# Patient Record
Sex: Female | Born: 1975 | Race: White | Hispanic: No | Marital: Married | Smoking: Never smoker
Health system: Southern US, Community
[De-identification: ages and names within clinical notes are randomized; demographics above are authoritative.]

## PROBLEM LIST (undated history)

## (undated) ENCOUNTER — Inpatient Hospital Stay (HOSPITAL_COMMUNITY): Payer: Self-pay

## (undated) DIAGNOSIS — O09529 Supervision of elderly multigravida, unspecified trimester: Secondary | ICD-10-CM

## (undated) DIAGNOSIS — R87629 Unspecified abnormal cytological findings in specimens from vagina: Secondary | ICD-10-CM

## (undated) DIAGNOSIS — Z9889 Other specified postprocedural states: Secondary | ICD-10-CM

## (undated) DIAGNOSIS — O09299 Supervision of pregnancy with other poor reproductive or obstetric history, unspecified trimester: Secondary | ICD-10-CM

## (undated) DIAGNOSIS — F419 Anxiety disorder, unspecified: Secondary | ICD-10-CM

## (undated) DIAGNOSIS — F32A Depression, unspecified: Secondary | ICD-10-CM

## (undated) DIAGNOSIS — J45909 Unspecified asthma, uncomplicated: Secondary | ICD-10-CM

## (undated) DIAGNOSIS — R112 Nausea with vomiting, unspecified: Secondary | ICD-10-CM

## (undated) DIAGNOSIS — A609 Anogenital herpesviral infection, unspecified: Secondary | ICD-10-CM

## (undated) DIAGNOSIS — F329 Major depressive disorder, single episode, unspecified: Secondary | ICD-10-CM

## (undated) HISTORY — PX: TONSILLECTOMY: SUR1361

## (undated) HISTORY — PX: LASIK: SHX215

## (undated) HISTORY — PX: ADENOIDECTOMY: SUR15

## (undated) HISTORY — PX: OTHER SURGICAL HISTORY: SHX169

## (undated) HISTORY — PX: COLPOSCOPY: SHX161

## (undated) HISTORY — PX: APPENDECTOMY: SHX54

---

## 2016-06-26 ENCOUNTER — Other Ambulatory Visit: Payer: Self-pay | Admitting: Obstetrics and Gynecology

## 2016-06-26 ENCOUNTER — Other Ambulatory Visit (HOSPITAL_COMMUNITY)
Admission: RE | Admit: 2016-06-26 | Discharge: 2016-06-26 | Disposition: A | Discharge status: Home / Self Care | Payer: Commercial Managed Care - PPO | Source: Ambulatory Visit | Attending: Obstetrics and Gynecology | Admitting: Obstetrics and Gynecology

## 2016-06-26 DIAGNOSIS — Z01419 Encounter for gynecological examination (general) (routine) without abnormal findings: Secondary | ICD-10-CM | POA: Diagnosis present

## 2016-06-26 DIAGNOSIS — Z1151 Encounter for screening for human papillomavirus (HPV): Secondary | ICD-10-CM | POA: Insufficient documentation

## 2016-06-27 LAB — CYTOLOGY - PAP

## 2016-11-26 DIAGNOSIS — J0101 Acute recurrent maxillary sinusitis: Secondary | ICD-10-CM | POA: Diagnosis not present

## 2016-12-07 DIAGNOSIS — R05 Cough: Secondary | ICD-10-CM | POA: Diagnosis not present

## 2016-12-11 DIAGNOSIS — R05 Cough: Secondary | ICD-10-CM | POA: Diagnosis not present

## 2016-12-11 DIAGNOSIS — R062 Wheezing: Secondary | ICD-10-CM | POA: Diagnosis not present

## 2016-12-11 DIAGNOSIS — J0100 Acute maxillary sinusitis, unspecified: Secondary | ICD-10-CM | POA: Diagnosis not present

## 2016-12-20 DIAGNOSIS — J343 Hypertrophy of nasal turbinates: Secondary | ICD-10-CM | POA: Diagnosis not present

## 2016-12-20 DIAGNOSIS — J3089 Other allergic rhinitis: Secondary | ICD-10-CM | POA: Diagnosis not present

## 2016-12-20 DIAGNOSIS — J329 Chronic sinusitis, unspecified: Secondary | ICD-10-CM | POA: Diagnosis not present

## 2017-01-25 ENCOUNTER — Encounter: Payer: Self-pay | Admitting: Allergy & Immunology

## 2017-01-25 ENCOUNTER — Encounter (INDEPENDENT_AMBULATORY_CARE_PROVIDER_SITE_OTHER): Payer: Self-pay

## 2017-01-25 ENCOUNTER — Ambulatory Visit (INDEPENDENT_AMBULATORY_CARE_PROVIDER_SITE_OTHER): Payer: Commercial Managed Care - PPO | Admitting: Allergy & Immunology

## 2017-01-25 VITALS — BP 110/80 | HR 89 | Temp 98.5°F | Resp 16 | Ht 68.5 in | Wt 265.6 lb

## 2017-01-25 DIAGNOSIS — J329 Chronic sinusitis, unspecified: Secondary | ICD-10-CM | POA: Diagnosis not present

## 2017-01-25 DIAGNOSIS — T781XXD Other adverse food reactions, not elsewhere classified, subsequent encounter: Secondary | ICD-10-CM

## 2017-01-25 DIAGNOSIS — J45990 Exercise induced bronchospasm: Secondary | ICD-10-CM | POA: Diagnosis not present

## 2017-01-25 DIAGNOSIS — J3089 Other allergic rhinitis: Secondary | ICD-10-CM | POA: Diagnosis not present

## 2017-01-25 NOTE — Progress Notes (Signed)
NEW PATIENT  Date of Service/Encounter:  01/25/17  Referring provider: Dr. Christia Reading   Assessment:   Chronic allergic rhinitis (grass, weeds, trees, molds, dust mite, cat, dog)  Chronic rhinosinusitis  Exercise-induced bronchospasm  Adverse food reaction - with negative skin testing   Plan/Recommendations:   1. Chronic allergic rhinitis - current trying to become pregnant - Testing today showed: positive to one grass, one weed, trees, molds, dust mite, cat, and dog - Avoidance measures provided. - Stop Flonase and change to Dymista two sprays per nostril daily.  - Continue with Allegra 1 tablet daily. - Continue with Pataday eyedrops as needed. - Add Singulair 10 mg daily. - Consider allergen immunotherapy as a means of long-term control. - Allergen immunotherapy can be continued while pregnant, however it cannot be advanced. - Therefore Monica [name] thinks that she is going to become pregnant soon, there is likely no need to start injections at this time. - We can touch base following the birth to see whether she would like to start the injections at that time. - Call your insurance company to check on coverage. - Call us back to let us know if you are interested in pursuing allergen immunotherapy.  2. Exercise-induced bronchospasm - Lung testing looks great today. - Continue with albuterol as needed. - The addition of the Singulair might provide some relief as well. - There is no indication for a controller medication at this time.  3. Adverse food reaction - Testing was negative to onion, strawberry, pineapple, and wheat. - The reactions to the strawberry and pineapple were likely related to oral allergy syndrome. - The onion reaction appears more of an intolerance. - She tolerates wheat without a problem.   4. Return in about 6 months (around 07/28/2017).   Subjective:   Monica [name] is a 41 y.o. female presenting today for evaluation of  Chief Complaint    Patient presents with  . Sinusitis  . Cough    Monica [name] has a history of the following: There are no active problems to display for this patient.   History obtained from: chart review and patient.  Monica [name] was referred by Dr. Christia Reading.     Monica [name] is a 41 y.o. female presenting for an allergy evaluation. She has a recent history of chronic rhinosinusitis. Since October 2017, she has been on 6 rounds of antibiotics as well as steroids and minimal improvement. She describes facial pain, draining eyes, cough, swelling of lymph nodes, and malodorous discolored nasal drainage. These provided minimal if any relief. She was evaluated by Dr. Jenne Pane at the end of January. He felt that her history was concerning for chronic sinusitis in the setting of allergic rhinitis. She had a sinus CT ordered but they are waiting until she becomes ill again before doing this. Currently she is on Flonase two sprays once daily. She is on Allegra once daily. She is on Pataday eye drops and ipratropium as well. She has been on Singulair in the distant past which seemed to help.   Monica [name] has a history of allergic rhinitis. She has gone to ENT and allergist since she was in high school. She was on shots for a period of two years in [place]; testing was performed in her early 49s. She was followed by Dr. Lacretia Leigh at [facility] in [place]. She also was followed by [facility] and she had lingual tissue removed in her early 43s. 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 She [facility] allergy [facility]. She did not feel that they helped.   Monica Mainlandlissa does have exercise induced bronchospasm and does have an albuterol inhaler. She estimates that she was using it daily for two weeks during this past fall and early spring. She was finally changed to a different nose spray and eye drops with an inhaler that seemed to improve her symptoms. This was managed by her  PCP at [facility].   She does have a history of hives intermittently. She has been positive to multiple foods including cantaloupe, Malaw[place]urkey, onion, and others. She does not really eat cantaloupe because it upsets of her stomach. She has stomach pain with the raw onions but she eats cooked onions. She does get mouth itching with pineapple and strawberries. Otherwise, there is no history of other atopic diseases, including asthma, drug allergies, food allergies, or stinging insect allergies. There is no significant infectious history. Vaccinations are up to date.    Past Medical History: There are no active problems to display for this patient.   Medication List:  Allergies as of 01/25/2017   No Known Allergies     Medication List       Accurate as of 01/25/17  3:47 PM. Always use your most recent med list.          cetirizine 10 MG tablet Commonly known as:  ZYRTEC Take by mouth.   fluticasone 50 MCG/ACT nasal spray Commonly known as:  FLONASE Place into the nose.   ipratropium 0.03 % nasal spray Commonly known as:  ATROVENT   levonorgestrel 20 MCG/24HR IUD Commonly known as:  MIRENA by Intrauterine route.   Olopatadine HCl 0.2 % Soln   PROAIR HFA 108 (90 Base) MCG/ACT inhaler Generic drug:  albuterol   valACYclovir 500 MG tablet Commonly known as:  VALTREX Take by mouth.       Birth History: non-contributory.    Developmental History: non-contributory.   Past Surgical History: Past Surgical History:  Procedure Laterality Date  . ADENOIDECTOMY    . CESAREAN SECTION  2013  . TONSILLECTOMY       Family History: Family History  Problem Relation Age of Onset  . Allergic rhinitis Neg Hx   . Angioedema Neg Hx   . Asthma Neg Hx   . Atopy Neg Hx   . Eczema Neg Hx   . Immunodeficiency Neg Hx   . Urticaria Neg Hx      Social History: The patient lives at home with her family. [place] There is no mildew problems and no  roaches. They have hardwood in the main living area is carpeting in the bedrooms. They have gas heating and central cooling. There are 2 dogs inside the home. They do not use dust mite covers on her bedding. There is no tobacco exposure. 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    Review of Systems: a 14-point review of systems is pertinent for what is mentioned in HPI.  Otherwise, all other systems were negative. Constitutional: negative other than that listed in the HPI Eyes: negative other than that listed in the HPI Ears, nose, mouth, throat, and face: negative other than that listed in the HPI Respiratory: negative other than that listed in the HPI Cardiovascular: negative other than that listed in the HPI Gastrointestinal: negative other than that listed in the HPI Genitourinary: negative other than that listed in the HPI Integument:  negative other than that listed in the HPI Hematologic: negative other than that listed in the HPI Musculoskeletal: negative other than that listed in the HPI Neurological: negative other than that listed in the HPI Allergy/Immunologic: negative other than that listed in the HPI    Objective:   Blood pressure 110/80, pulse 89, temperature 98.5 F (36.9 C), temperature source Oral, resp. rate 16, height 5' 8.5" (1.74 m), weight 265 lb 9.6 oz (120.5 kg), SpO2 99 %. Body mass index is 39.79 kg/m.   Physical Exam:  General: Alert, interactive, in no acute distress. Cooperative with the exam. Very pleasant and talkative. Eyes: No conjunctival injection present on the right, No conjunctival injection present on the left, PERRL bilaterally, No discharge on the right, No discharge on the left and No Horner-Trantas dots present Ears: Right TM pearly gray with normal light reflex, Left TM pearly gray with normal light reflex, Right TM intact without perforation and Left TM intact without perforation.  Nose/Throat: External nose within  normal limits and septum midline, turbinates markedly edematous and pale with clear discharge, post-pharynx erythematous with cobblestoning in the posterior oropharynx. Tonsils 2+ without exudates Neck: Supple without thyromegaly.  Adenopathy: no enlarged lymph nodes appreciated in the anterior cervical, occipital, axillary, epitrochlear, inguinal, or popliteal regions Lungs: Clear to auscultation without wheezing, rhonchi or rales. No increased work of breathing. CV: Normal S1/S2, no murmurs. Capillary refill <2 seconds.  Abdomen: Nondistended, nontender. No guarding or rebound tenderness. Bowel sounds present in all fields and hyperactive  Skin: Warm and dry, without lesions or rashes. Extremities:  No clubbing, cyanosis or edema. Neuro:   Grossly intact. No focal deficits appreciated. Responsive to questions.  Diagnostic studies:  Spirometry: results normal (FEV1: 3.63/112%, FVC: 4.50/115%, FEV1/FVC: 80%).    Spirometry consistent with normal pattern.   Allergy Studies:   Indoor/Outdoor Percutaneous Adult Environmental Panel: positive to bahia grass, lamb's quarters, birch, hickory, maple, oak, pecan pollen, Cladosporium, Aspergillus, Bipolaris, Drechslera, Phoma, Candida, Tricophyton, Dp mites, cat and dog. Otherwise negative with adequate controls.  Selected Foods Panel (strawberry, pineapple, wheat, onion): negative with adequate controls    Malachi Bonds, MD [facility]

## 2017-01-25 NOTE — Patient Instructions (Addendum)
1. Chronic allergic rhinitis - Testing today showed: Positive to 1 grass, one weed, trees, molds, dust mite, cat, and dog - Avoidance measures provided. - Stop Flonase and change to Rhinocort two sprays per nostril daily (safe during pregnancy)  - Stop Allegra and change to Zyrtec 10mg  daily.  - Continue with Pataday eyedrops as needed. - Add Singulair 10 mg daily. - Consider allergen immunotherapy as a means of long-term control. - Call your insurance company to check on coverage. - Call us back to let us know if you are interested in pursuing allergen immunotherapy.  2. Exercise-induced bronchospasm - Lung testing looks great today. - Continue with albuterol as needed. - The addition of the Singulair might provide some relief as well. - There is no indication for a controller medication at this time.  3. Return in about 6 months (around 07/28/2017).  Please inform us of any Emergency Department visits, hospitalizations, or changes in symptoms. Call us before going to the ED for breathing or allergy symptoms since we might be able to fit you in for a sick visit. Feel free to contact us anytime with any questions, problems, or concerns.  It was a pleasure to meet you today! Happy spring!   Websites that have reliable patient information: 1. American Academy of Asthma, Allergy, and Immunology: www.aaaai.org 2. Food Allergy Research and Education (FARE): foodallergy.org 3. Mothers of Asthmatics: http://www.asthmacommunitynetwork.org 4. American College of Allergy, Asthma, and Immunology: www.acaai.org  Reducing Pollen Exposure  The American Academy of Allergy, Asthma and Immunology suggests the following steps to reduce your exposure to pollen during allergy seasons.    1. Do not hang sheets or clothing out to dry; pollen may collect on these items. 2. Do not mow lawns or spend time around freshly cut grass; mowing stirs up pollen. 3. Keep windows closed at night.  Keep car windows  closed while driving. 4. Minimize morning activities outdoors, a time when pollen counts are usually at their highest. 5. Stay indoors as much as possible when pollen counts or humidity is high and on windy days when pollen tends to remain in the air longer. 6. Use air conditioning when possible.  Many air conditioners have filters that trap the pollen spores. 7. Use a HEPA room air filter to remove pollen form the indoor air you breathe.  Control of Mold Allergen  Mold and fungi can grow on a variety of surfaces provided certain temperature and moisture conditions exist.  Outdoor molds grow on plants, decaying vegetation and soil.  The major outdoor mold, Alternaria and Cladosporium, are found in very high numbers during hot and dry conditions.  Generally, a late Summer - Fall peak is seen for common outdoor fungal spores.  Rain will temporarily lower outdoor mold spore count, but counts rise rapidly when the rainy period ends.  The most important indoor molds are Aspergillus and Penicillium.  Dark, humid and poorly ventilated basements are ideal sites for mold growth.  The next most common sites of mold growth are the bathroom and the kitchen.  Outdoor MicrosoftMold Control 1. Use air conditioning and keep windows closed 2. Avoid exposure to decaying vegetation. 3. Avoid leaf raking. 4. Avoid grain handling. 5. Consider wearing a face mask if working in moldy areas.  Indoor Mold Control 1. Maintain humidity below 50%. 2. Clean washable surfaces with 5% bleach solution. 3. Remove sources e.g. contaminated carpets.  Control of Dog or Cat Allergen  Avoidance is the best way to manage a dog or  cat allergy. If you have a dog or cat and are allergic to dog or cats, consider removing the dog or cat from the home. If you have a dog or cat but don't want to find it a new home, or if your family wants a pet even though someone in the household is allergic, here are some strategies that may help keep symptoms  at bay:  1. Keep the pet out of your bedroom and restrict it to only a few rooms. Be advised that keeping the dog or cat in only one room will not limit the allergens to that room. 2. Don't pet, hug or kiss the dog or cat; if you do, wash your hands with soap and water. 3. High-efficiency particulate air (HEPA) cleaners run continuously in a bedroom or living room can reduce allergen levels over time. 4. Regular use of a high-efficiency vacuum cleaner or a central vacuum can reduce allergen levels. 5. Giving your dog or cat a bath at least once a week can reduce airborne allergen.  Control of House Dust Mite Allergen    House dust mites play a major role in allergic asthma and rhinitis.  They occur in environments with high humidity wherever human skin, the food for dust mites is found. High levels have been detected in dust obtained from mattresses, pillows, carpets, upholstered furniture, bed covers, clothes and soft toys.  The principal allergen of the house dust mite is found in its feces.  A gram of dust may contain 1,000 mites and 250,000 fecal particles.  Mite antigen is easily measured in the air during house cleaning activities.    1. Encase mattresses, including the box spring, and pillow, in an air tight cover.  Seal the zipper end of the encased mattresses with wide adhesive tape. 2. Wash the bedding in water of 130 degrees Farenheit weekly.  Avoid cotton comforters/quilts and flannel bedding: the most ideal bed covering is the dacron comforter. 3. Remove all upholstered furniture from the bedroom. 4. Remove carpets, carpet padding, rugs, and non-washable window drapes from the bedroom.  Wash drapes weekly or use plastic window coverings. 5. Remove all non-washable stuffed toys from the bedroom.  Wash stuffed toys weekly. 6. Have the room cleaned frequently with a vacuum cleaner and a damp dust-mop.  The patient should not be in a room which is being cleaned and should wait 1 hour  after cleaning before going into the room. 7. Close and seal all heating outlets in the bedroom.  Otherwise, the room will become filled with dust-laden air.  An electric heater can be used to heat the room. 8. Reduce indoor humidity to less than 50%.  Do not use a humidifier.

## 2017-03-20 DIAGNOSIS — N911 Secondary amenorrhea: Secondary | ICD-10-CM | POA: Diagnosis not present

## 2017-03-28 LAB — OB RESULTS CONSOLE GBS: STREP GROUP B AG: POSITIVE

## 2017-03-28 LAB — OB RESULTS CONSOLE HEPATITIS B SURFACE ANTIGEN: Hepatitis B Surface Ag: NEGATIVE

## 2017-03-28 LAB — OB RESULTS CONSOLE GC/CHLAMYDIA
CHLAMYDIA, DNA PROBE: NEGATIVE
GC PROBE AMP, GENITAL: NEGATIVE

## 2017-03-28 LAB — OB RESULTS CONSOLE ANTIBODY SCREEN: ANTIBODY SCREEN: NEGATIVE

## 2017-03-28 LAB — OB RESULTS CONSOLE RUBELLA ANTIBODY, IGM: RUBELLA: IMMUNE

## 2017-03-28 LAB — OB RESULTS CONSOLE ABO/RH: RH Type: POSITIVE

## 2017-03-28 LAB — OB RESULTS CONSOLE RPR: RPR: NONREACTIVE

## 2017-03-28 LAB — OB RESULTS CONSOLE HIV ANTIBODY (ROUTINE TESTING): HIV: NONREACTIVE

## 2017-04-19 DIAGNOSIS — J0100 Acute maxillary sinusitis, unspecified: Secondary | ICD-10-CM | POA: Diagnosis not present

## 2017-04-19 DIAGNOSIS — R0981 Nasal congestion: Secondary | ICD-10-CM | POA: Diagnosis not present

## 2017-06-22 DIAGNOSIS — L299 Pruritus, unspecified: Secondary | ICD-10-CM | POA: Diagnosis not present

## 2017-07-20 DIAGNOSIS — J0100 Acute maxillary sinusitis, unspecified: Secondary | ICD-10-CM | POA: Diagnosis not present

## 2017-07-30 ENCOUNTER — Ambulatory Visit: Payer: Commercial Managed Care - PPO | Admitting: Allergy & Immunology

## 2017-08-09 DIAGNOSIS — Z23 Encounter for immunization: Secondary | ICD-10-CM | POA: Diagnosis not present

## 2017-08-16 ENCOUNTER — Inpatient Hospital Stay (HOSPITAL_COMMUNITY)
Admission: AD | Admit: 2017-08-16 | Discharge: 2017-08-16 | Disposition: A | Discharge status: Home / Self Care | Payer: Commercial Managed Care - PPO | Source: Ambulatory Visit | Attending: Obstetrics & Gynecology | Admitting: Obstetrics & Gynecology

## 2017-08-16 ENCOUNTER — Encounter (HOSPITAL_COMMUNITY): Payer: Self-pay | Admitting: *Deleted

## 2017-08-16 DIAGNOSIS — R51 Headache: Secondary | ICD-10-CM

## 2017-08-16 DIAGNOSIS — O1203 Gestational edema, third trimester: Secondary | ICD-10-CM | POA: Insufficient documentation

## 2017-08-16 DIAGNOSIS — Z3689 Encounter for other specified antenatal screening: Secondary | ICD-10-CM

## 2017-08-16 DIAGNOSIS — Z3A28 28 weeks gestation of pregnancy: Secondary | ICD-10-CM | POA: Diagnosis not present

## 2017-08-16 DIAGNOSIS — O26893 Other specified pregnancy related conditions, third trimester: Secondary | ICD-10-CM

## 2017-08-16 DIAGNOSIS — R609 Edema, unspecified: Secondary | ICD-10-CM

## 2017-08-16 LAB — URINALYSIS, ROUTINE W REFLEX MICROSCOPIC
Bilirubin Urine: NEGATIVE
Glucose, UA: NEGATIVE mg/dL
HGB URINE DIPSTICK: NEGATIVE
Ketones, ur: NEGATIVE mg/dL
Leukocytes, UA: NEGATIVE
Nitrite: NEGATIVE
PH: 5 (ref 5.0–8.0)
Protein, ur: NEGATIVE mg/dL
SPECIFIC GRAVITY, URINE: 1.021 (ref 1.005–1.030)

## 2017-08-16 LAB — COMPREHENSIVE METABOLIC PANEL
ALK PHOS: 86 U/L (ref 38–126)
ALT: 15 U/L (ref 14–54)
ANION GAP: 9 (ref 5–15)
AST: 18 U/L (ref 15–41)
Albumin: 2.9 g/dL — ABNORMAL LOW (ref 3.5–5.0)
BUN: 7 mg/dL (ref 6–20)
CALCIUM: 8.7 mg/dL — AB (ref 8.9–10.3)
CO2: 22 mmol/L (ref 22–32)
Chloride: 104 mmol/L (ref 101–111)
Creatinine, Ser: 0.45 mg/dL (ref 0.44–1.00)
GFR calc Af Amer: >60 mL/min (ref >60)
GFR calc non Af Amer: >60 mL/min (ref >60)
GLUCOSE: 91 mg/dL (ref 65–99)
POTASSIUM: 3.4 mmol/L — AB (ref 3.5–5.1)
SODIUM: 135 mmol/L (ref 135–145)
Total Bilirubin: 0.5 mg/dL (ref 0.3–1.2)
Total Protein: 6.3 g/dL — ABNORMAL LOW (ref 6.5–8.1)

## 2017-08-16 LAB — CBC
HEMATOCRIT: 31.4 % — AB (ref 36.0–46.0)
HEMOGLOBIN: 10.6 g/dL — AB (ref 12.0–15.0)
MCH: 28.3 pg (ref 26.0–34.0)
MCHC: 33.8 g/dL (ref 30.0–36.0)
MCV: 84 fL (ref 78.0–100.0)
Platelets: 287 10*3/uL (ref 150–400)
RBC: 3.74 MIL/uL — ABNORMAL LOW (ref 3.87–5.11)
RDW: 13.5 % (ref 11.5–15.5)
WBC: 11.8 10*3/uL — ABNORMAL HIGH (ref 4.0–10.5)

## 2017-08-16 LAB — PROTEIN / CREATININE RATIO, URINE
CREATININE, URINE: 201 mg/dL
Protein Creatinine Ratio: 0.07 mg/mg{Cre} (ref 0.00–0.15)
Total Protein, Urine: 14 mg/dL

## 2017-08-16 MED ORDER — ACETAMINOPHEN 500 MG PO TABS
1000.0000 mg | ORAL_TABLET | Freq: Four times a day (QID) | ORAL | Status: DC | PRN
Start: 2017-08-16 — End: 2017-08-16
  Administered 2017-08-16: 1000 mg via ORAL
  Filled 2017-08-16: qty 2

## 2017-08-16 NOTE — Discharge Instructions (Signed)
Third Trimester of Pregnancy The third trimester is from week 28 through week 40 (months 7 through 9). The third trimester is a time when the unborn baby (fetus) is growing rapidly. At the end of the ninth month, the fetus is about 20 inches in length and weighs 6-10 pounds. Body changes during your third trimester Your body will continue to go through many changes during pregnancy. The changes vary from woman to woman. During the third trimester:  Your weight will continue to increase. You can expect to gain 25-35 pounds (11-16 kg) by the end of the pregnancy.  You may begin to get stretch marks on your hips, abdomen, and breasts.  You may urinate more often because the fetus is moving lower into your pelvis and pressing on your bladder.  You may develop or continue to have heartburn. This is caused by increased hormones that slow down muscles in the digestive tract.  You may develop or continue to have constipation because increased hormones slow digestion and cause the muscles that push waste through your intestines to relax.  You may develop hemorrhoids. These are swollen veins (varicose veins) in the rectum that can itch or be painful.  You may develop swollen, bulging veins (varicose veins) in your legs.  You may have increased body aches in the pelvis, back, or thighs. This is due to weight gain and increased hormones that are relaxing your joints.  You may have changes in your hair. These can include thickening of your hair, rapid growth, and changes in texture. Some women also have hair loss during or after pregnancy, or hair that feels dry or thin. Your hair will most likely return to normal after your baby is born.  Your breasts will continue to grow and they will continue to become tender. A yellow fluid (colostrum) may leak from your breasts. This is the first milk you are producing for your baby.  Your belly button may stick out.  You may notice more swelling in your hands,  face, or ankles.  You may have increased tingling or numbness in your hands, arms, and legs. The skin on your belly may also feel numb.  You may feel short of breath because of your expanding uterus.  You may have more problems sleeping. This can be caused by the size of your belly, increased need to urinate, and an increase in your body's metabolism.  You may notice the fetus "dropping," or moving lower in your abdomen (lightening).  You may have increased vaginal discharge.  You may notice your joints feel loose and you may have pain around your pelvic bone.  What to expect at prenatal visits You will have prenatal exams every 2 weeks until week 36. Then you will have weekly prenatal exams. During a routine prenatal visit:  You will be weighed to make sure you and the baby are growing normally.  Your blood pressure will be taken.  Your abdomen will be measured to track your baby's growth.  The fetal heartbeat will be listened to.  Any test results from the previous visit will be discussed.  You may have a cervical check near your due date to see if your cervix has softened or thinned (effaced).  You will be tested for Group B streptococcus. This happens between 35 and 37 weeks.  Your health care provider may ask you:  What your birth plan is.  How you are feeling.  If you are feeling the baby move.  If you have had   any abnormal symptoms, such as leaking fluid, bleeding, severe headaches, or abdominal cramping.  If you are using any tobacco products, including cigarettes, chewing tobacco, and electronic cigarettes.  If you have any questions.  Other tests or screenings that may be performed during your third trimester include:  Blood tests that check for low iron levels (anemia).  Fetal testing to check the health, activity level, and growth of the fetus. Testing is done if you have certain medical conditions or if there are problems during the  pregnancy.  Nonstress test (NST). This test checks the health of your baby to make sure there are no signs of problems, such as the baby not getting enough oxygen. During this test, a belt is placed around your belly. The baby is made to move, and its heart rate is monitored during movement.  What is false labor? False labor is a condition in which you feel small, irregular tightenings of the muscles in the womb (contractions) that usually go away with rest, changing position, or drinking water. These are called Braxton Hicks contractions. Contractions may last for hours, days, or even weeks before true labor sets in. If contractions come at regular intervals, become more frequent, increase in intensity, or become painful, you should see your health care provider. What are the signs of labor?  Abdominal cramps.  Regular contractions that start at 10 minutes apart and become stronger and more frequent with time.  Contractions that start on the top of the uterus and spread down to the lower abdomen and back.  Increased pelvic pressure and dull back pain.  A watery or bloody mucus discharge that comes from the vagina.  Leaking of amniotic fluid. This is also known as your "water breaking." It could be a slow trickle or a gush. Let your health care provider know if it has a color or strange odor. If you have any of these signs, call your health care provider right away, even if it is before your due date. Follow these instructions at home: Medicines  Follow your health care provider's instructions regarding medicine use. Specific medicines may be either safe or unsafe to take during pregnancy.  Take a prenatal vitamin that contains at least 600 micrograms (mcg) of folic acid.  If you develop constipation, try taking a stool softener if your health care provider approves. Eating and drinking  Eat a balanced diet that includes fresh fruits and vegetables, whole grains, good sources of protein  such as meat, eggs, or tofu, and low-fat dairy. Your health care provider will help you determine the amount of weight gain that is right for you.  Avoid raw meat and uncooked cheese. These carry germs that can cause birth defects in the baby.  If you have low calcium intake from food, talk to your health care provider about whether you should take a daily calcium supplement.  Eat four or five small meals rather than three large meals a day.  Limit foods that are high in fat and processed sugars, such as fried and sweet foods.  To prevent constipation: ? Drink enough fluid to keep your urine clear or pale yellow. ? Eat foods that are high in fiber, such as fresh fruits and vegetables, whole grains, and beans. Activity  Exercise only as directed by your health care provider. Most women can continue their usual exercise routine during pregnancy. Try to exercise for 30 minutes at least 5 days a week. Stop exercising if you experience uterine contractions.  Avoid heavy   lifting.  Do not exercise in extreme heat or humidity, or at high altitudes.  Wear low-heel, comfortable shoes.  Practice good posture.  You may continue to have sex unless your health care provider tells you otherwise. Relieving pain and discomfort  Take frequent breaks and rest with your legs elevated if you have leg cramps or low back pain.  Take warm sitz baths to soothe any pain or discomfort caused by hemorrhoids. Use hemorrhoid cream if your health care provider approves.  Wear a good support bra to prevent discomfort from breast tenderness.  If you develop varicose veins: ? Wear support pantyhose or compression stockings as told by your healthcare provider. ? Elevate your feet for 15 minutes, 3-4 times a day. Prenatal care  Write down your questions. Take them to your prenatal visits.  Keep all your prenatal visits as told by your health care provider. This is important. Safety  Wear your seat belt at  all times when driving.  Make a list of emergency phone numbers, including numbers for family, friends, the hospital, and police and fire departments. General instructions  Avoid cat litter boxes and soil used by cats. These carry germs that can cause birth defects in the baby. If you have a cat, ask someone to clean the litter box for you.  Do not travel far distances unless it is absolutely necessary and only with the approval of your health care provider.  Do not use hot tubs, steam rooms, or saunas.  Do not drink alcohol.  Do not use any products that contain nicotine or tobacco, such as cigarettes and e-cigarettes. If you need help quitting, ask your health care provider.  Do not use any medicinal herbs or unprescribed drugs. These chemicals affect the formation and growth of the baby.  Do not douche or use tampons or scented sanitary pads.  Do not cross your legs for long periods of time.  To prepare for the arrival of your baby: ? Take prenatal classes to understand, practice, and ask questions about labor and delivery. ? Make a trial run to the hospital. ? Visit the hospital and tour the maternity area. ? Arrange for maternity or paternity leave through employers. ? Arrange for family and friends to take care of pets while you are in the hospital. ? Purchase a rear-facing car seat and make sure you know how to install it in your car. ? Pack your hospital bag. ? Prepare the baby's nursery. Make sure to remove all pillows and stuffed animals from the baby's crib to prevent suffocation.  Visit your dentist if you have not gone during your pregnancy. Use a soft toothbrush to brush your teeth and be gentle when you floss. Contact a health care provider if:  You are unsure if you are in labor or if your water has broken.  You become dizzy.  You have mild pelvic cramps, pelvic pressure, or nagging pain in your abdominal area.  You have lower back pain.  You have persistent  nausea, vomiting, or diarrhea.  You have an unusual or bad smelling vaginal discharge.  You have pain when you urinate. Get help right away if:  Your water breaks before 37 weeks.  You have regular contractions less than 5 minutes apart before 37 weeks.  You have a fever.  You are leaking fluid from your vagina.  You have spotting or bleeding from your vagina.  You have severe abdominal pain or cramping.  You have rapid weight loss or weight gain.    You have shortness of breath with chest pain.  You notice sudden or extreme swelling of your face, hands, ankles, feet, or legs.  Your baby makes fewer than 10 movements in 2 hours.  You have severe headaches that do not go away when you take medicine.  You have vision changes. Summary  The third trimester is from week 28 through week 40, months 7 through 9. The third trimester is a time when the unborn baby (fetus) is growing rapidly.  During the third trimester, your discomfort may increase as you and your baby continue to gain weight. You may have abdominal, leg, and back pain, sleeping problems, and an increased need to urinate.  During the third trimester your breasts will keep growing and they will continue to become tender. A yellow fluid (colostrum) may leak from your breasts. This is the first milk you are producing for your baby.  False labor is a condition in which you feel small, irregular tightenings of the muscles in the womb (contractions) that eventually go away. These are called Braxton Hicks contractions. Contractions may last for hours, days, or even weeks before true labor sets in.  Signs of labor can include: abdominal cramps; regular contractions that start at 10 minutes apart and become stronger and more frequent with time; watery or bloody mucus discharge that comes from the vagina; increased pelvic pressure and dull back pain; and leaking of amniotic fluid. This information is not intended to replace advice  given to you by your health care provider. Make sure you discuss any questions you have with your health care provider. Document Released: 10/31/2001 Document Revised: 04/13/2016 Document Reviewed: 01/07/2013 Elsevier Interactive Patient Education  2017 [employer]  

## 2017-08-16 NOTE — MAU Provider Note (Signed)
History     CSN: [id number]  Arrival date and time: 08/16/17 4098   First Provider Initiated Contact with Patient 08/16/17 1858      Chief Complaint  Patient presents with  . Headache   G2P1001 .3 wks here with HA and swelling. Sx started about 2 weeks ago and have been intermittent. HA is frontal and she has not taken anything for it. Denies RUQ/epigastric pain. Reports good FM. H/o HELLP  wks and CS with G1.   OB History    Gravida Para Term Preterm AB Living   SAB TAB Ectopic Multiple Live Births                  Past Medical History:  Diagnosis Date  . Medical history non-contributory     Past Surgical History:  Procedure Laterality Date  . ADENOIDECTOMY    . CESAREAN SECTION  2013  . TONSILLECTOMY      Family History  Problem Relation Age of Onset  . Allergic rhinitis Neg Hx   . Angioedema Neg Hx   . Asthma Neg Hx   . Atopy Neg Hx   . Eczema Neg Hx   . Immunodeficiency Neg Hx   . Urticaria Neg Hx     Social History  Substance Use Topics  . Smoking status: Never Smoker  . Smokeless tobacco: Never Used  . Alcohol use No    Allergies: No Known Allergies  Prescriptions Prior to Admission  Medication Sig Dispense Refill Last Dose  . albuterol (PROAIR HFA) 108 (90 Base) MCG/ACT inhaler    Taking  . cetirizine (ZYRTEC) 10 MG tablet Take by mouth.   Taking  . fluticasone (FLONASE) 50 MCG/ACT nasal spray Place into the nose.   Taking  . ipratropium (ATROVENT) 0.03 % nasal spray    Taking  . levonorgestrel (MIRENA) 20 MCG/24HR IUD by Intrauterine route.   Taking  . Olopatadine HCl 0.2 % SOLN    Taking  . valACYclovir (VALTREX) 500 MG tablet Take by mouth.   Taking    Review of Systems  Respiratory: Positive for shortness of breath (intermittent).   Cardiovascular: Positive for leg swelling.  Gastrointestinal: Negative for abdominal pain.  Genitourinary: Negative for vaginal bleeding.  Musculoskeletal: Positive for back pain.   Neurological: Positive for light-headedness and headaches.   Physical Exam   Blood pressure 119/63, pulse 88, temperature 98.1 F (36.7 C), temperature source Oral, resp. rate 19, height 5' 8.5" (1.74 m), weight 291 lb (132 kg), SpO2 100 %.  Patient Vitals for the past 24 hrs:  BP Temp Temp src Pulse Resp SpO2 Height Weight  08/16/17 1931 119/63 - - 88 - - - -  08/16/17 1915 127/70 - - 96 - - - -  08/16/17 1850 115/72 - - 96 - - - -  08/16/17 1834 (!) 173/72 98.1 F (36.7 C) Oral 98 19 100 % 5' 8.5" (1.74 m) 291 lb (132 kg)    Physical Exam  Nursing note and vitals reviewed. Constitutional: She is oriented to person, place, and time. She appears well-developed and well-nourished.  HENT:  Head: Normocephalic and atraumatic.  Neck: Normal range of motion.  Cardiovascular: Normal rate.   Respiratory: Effort normal.  GI: Soft. She exhibits no distension. There is no tenderness.  gravid  Musculoskeletal: Normal range of motion. She exhibits edema (2+ BLE).  Neurological: She is alert and oriented to person, place, and time.  Skin:  Skin is warm and dry.  Psychiatric: She has a normal mood and affect.  EFM: 130 bpm, mod variability, + accels, no decels Toco: none  Results for orders placed or performed during the hospital encounter of 08/16/17 (from the past 24 hour(s))  Urinalysis, Routine w reflex microscopic     Status: Abnormal   Collection Time: 08/16/17  6:34 PM  Result Value Ref Range   Color, Urine YELLOW YELLOW   APPearance CLOUDY (A) CLEAR   Specific Gravity, Urine 1.021 1.005 - 1.030   pH 5.0 5.0 - 8.0   Glucose, UA NEGATIVE NEGATIVE mg/dL   Hgb urine dipstick NEGATIVE NEGATIVE   Bilirubin Urine NEGATIVE NEGATIVE   Ketones, ur NEGATIVE NEGATIVE mg/dL   Protein, ur NEGATIVE NEGATIVE mg/dL   Nitrite NEGATIVE NEGATIVE   Leukocytes, UA NEGATIVE NEGATIVE  Protein / creatinine ratio, urine     Status: None   Collection Time: 08/16/17  6:34 PM  Result Value Ref Range    Creatinine, Urine 201.00 mg/dL   Total Protein, Urine 14 mg/dL   Protein Creatinine Ratio 0.07 0.00 - 0.15 mg/mg[Cre]  Comprehensive metabolic panel     Status: Abnormal   Collection Time: 08/16/17  7:08 PM  Result Value Ref Range   Sodium 135 135 - 145 mmol/L   Potassium 3.4 (L) 3.5 - 5.1 mmol/L   Chloride 104 101 - 111 mmol/L   CO2 22 22 - 32 mmol/L   Glucose, Bld 91 65 - 99 mg/dL   BUN 7 6 - 20 mg/dL   Creatinine, Ser 1.61 0.44 - 1.00 mg/dL   Calcium 8.7 (L) 8.9 - 10.3 mg/dL   Total Protein 6.3 (L) 6.5 - 8.1 g/dL   Albumin 2.9 (L) 3.5 - 5.0 g/dL   AST 18 15 - 41 U/L   ALT 15 14 - 54 U/L   Alkaline Phosphatase 86 38 - 126 U/L   Total Bilirubin 0.5 0.3 - 1.2 mg/dL   GFR calc non Af Amer >60 >60 mL/min   GFR calc Af Amer >60 >60 mL/min   Anion gap 9 5 - 15  CBC     Status: Abnormal   Collection Time: 08/16/17  7:08 PM  Result Value Ref Range   WBC 11.8 (H) 4.0 - 10.5 K/uL   RBC 3.74 (L) 3.87 - 5.11 MIL/uL   Hemoglobin 10.6 (L) 12.0 - 15.0 g/dL   HCT 09.6 (L) 04.5 - 40.9 %   MCV 84.0 78.0 - 100.0 fL   MCH 28.3 26.0 - 34.0 pg   MCHC 33.8 30.0 - 36.0 g/dL   RDW 81.1 91.4 - 78.2 %   Platelets 287 150 - 400 K/uL   MAU Course  Procedures  MDM Labs ordered and reviewed. No evidence of pre-e. HA improved. Presentation, clinical findings, and plan discussed with Dr. Langston Masker. Stable for discharge home.  Assessment and Plan   1. [redacted] weeks gestation of pregnancy   2. NST (non-stress test) reactive   3. Dependent edema   4. Pregnancy headache in third trimester    Discharge home Follow up in OB office next week as scheduled Strict pre-e precautions Maternity belt Compression hose Elevate feet/legs Increase H2O  Allergies as of 08/16/2017   No Known Allergies     Medication List    STOP taking these medications   ipratropium 0.03 % nasal spray Commonly known as:  ATROVENT   levonorgestrel 20 MCG/24HR IUD Commonly known as:  MIRENA   Olopatadine HCl 0.2 % Soln  valACYclovir 500 MG tablet Commonly known as:  VALTREX     TAKE these medications   cetirizine 10 MG tablet Commonly known as:  ZYRTEC Take by mouth.   fluticasone 50 MCG/ACT nasal spray Commonly known as:  FLONASE Place into the nose.   PROAIR HFA 108 (90 Base) MCG/ACT inhaler Generic drug:  albuterol            Discharge Care Instructions        Start     Ordered   08/16/17 0000  Discharge patient    Question Answer Comment  Discharge disposition 01-Home or Self Care   Discharge patient date 08/16/2017      08/16/17 1955     Donette Larry, Ina Homes 08/16/2017, 7:59 PM

## 2017-08-16 NOTE — MAU Note (Signed)
Pt states she had really bad swelling in her feet and ankles today, has had headaches for about 2 weeks and dizziness off/on.

## 2017-09-06 DIAGNOSIS — Z23 Encounter for immunization: Secondary | ICD-10-CM | POA: Diagnosis not present

## 2017-10-08 NOTE — H&P (Signed)
Monica [name] is a 41 y.o. female presenting for repeat c-section.  OB History    Gravida Para Term Preterm AB Living   2 1 1          SAB TAB Ectopic Multiple Live Births                 Past Medical History:  Diagnosis Date  . Medical history non-contributory    Past Surgical History:  Procedure Laterality Date  . ADENOIDECTOMY    . CESAREAN SECTION  2013  . TONSILLECTOMY     Family History: family history is not on file. Social History:  reports that  has never smoked. she has never used smokeless tobacco. She reports that she does not drink alcohol or use drugs.     Maternal Diabetes: No Genetic Screening: Normal Maternal Ultrasounds/Referrals: Normal Fetal Ultrasounds or other Referrals:  None Maternal Substance Abuse:  No Significant Maternal Medications:  None Significant Maternal Lab Results:  None Other Comments:  None  ROS History   Exam Physical Exam  Gen - NAD ABd - gravid, NT Ext - NT CV - RRR Lungs - clear Prenatal labs: ABO, Rh:   Antibody:   Rubella:   RPR:    HBsAg:    HIV:    GBS:   negative  Assessment/Plan: Previous c-section.  Desires repeat   Monica [name] 10/08/2017, 4:54 PM

## 2017-10-16 ENCOUNTER — Encounter (HOSPITAL_COMMUNITY): Payer: Self-pay

## 2017-10-16 ENCOUNTER — Other Ambulatory Visit: Payer: Self-pay

## 2017-10-16 ENCOUNTER — Inpatient Hospital Stay (HOSPITAL_COMMUNITY)
Admission: AD | Admit: 2017-10-16 | Discharge: 2017-10-16 | Disposition: A | Discharge status: Home / Self Care | Payer: Commercial Managed Care - PPO | Source: Ambulatory Visit | Attending: Obstetrics and Gynecology | Admitting: Obstetrics and Gynecology

## 2017-10-16 DIAGNOSIS — Z3A37 37 weeks gestation of pregnancy: Secondary | ICD-10-CM

## 2017-10-16 DIAGNOSIS — O26893 Other specified pregnancy related conditions, third trimester: Secondary | ICD-10-CM | POA: Insufficient documentation

## 2017-10-16 DIAGNOSIS — O4703 False labor before 37 completed weeks of gestation, third trimester: Secondary | ICD-10-CM

## 2017-10-16 DIAGNOSIS — O9989 Other specified diseases and conditions complicating pregnancy, childbirth and the puerperium: Secondary | ICD-10-CM

## 2017-10-16 DIAGNOSIS — M79604 Pain in right leg: Secondary | ICD-10-CM | POA: Diagnosis not present

## 2017-10-16 DIAGNOSIS — Z79899 Other long term (current) drug therapy: Secondary | ICD-10-CM | POA: Insufficient documentation

## 2017-10-16 NOTE — Discharge Instructions (Signed)
[facility] to [facility] at 8 am!!!  Bring paperwork from tonight's visit!      Deep Vein Thrombosis A deep vein thrombosis (DVT) is a blood clot (thrombus) that usually occurs in a deep, larger vein of the lower leg or the pelvis, or in an upper extremity such as the arm. These are dangerous and can lead to serious and even life-threatening complications if the clot travels to the lungs. A DVT can damage the valves in your leg veins so that instead of flowing upward, the blood pools in the lower leg. This is called post-thrombotic syndrome, and it can result in pain, swelling, discoloration, and sores on the leg. What are the causes? A DVT is caused by the formation of a blood clot in your leg, pelvis, or arm. Usually, several things contribute to the formation of blood clots. A clot may develop when:  Your blood flow slows down.  Your vein becomes damaged in some way.  You have a condition that makes your blood clot more easily.  What increases the risk? A DVT is more likely to develop in:  People who are older, especially over 39 years of age.  People who are overweight (obese).  People who sit or lie still for a long time, such as during long-distance travel (over 4 hours), bed rest, hospitalization, or during recovery from certain medical conditions like a stroke.  People who do not engage in much physical activity (sedentary lifestyle).  People who have chronic breathing disorders.  People who have a personal or family history of blood clots or blood clotting disease.  People who have peripheral vascular disease (PVD), diabetes, or some types of cancer.  People who have heart disease, especially if the person had a recent heart attack or has congestive heart failure.  People who have neurological diseases that affect the legs (leg paresis).  People who have had a traumatic injury, such as breaking a hip or leg.  People who have recently had major or lengthy surgery,  especially on the hip, knee, or abdomen.  People who have had a central line placed inside a large vein.  People who take medicines that contain the hormone estrogen. These include birth control pills and hormone replacement therapy.  Pregnancy or during childbirth or the postpartum period.  Long plane flights (over 8 hours).  What are the signs or symptoms?  Symptoms of a DVT can include:  Swelling of your leg or arm, especially if one side is much worse.  Warmth and redness of your leg or arm, especially if one side is much worse.  Pain in your arm or leg. If the clot is in your leg, symptoms may be more noticeable or worse when you stand or walk.  A feeling of pins and needles, if the clot is in the arm.  The symptoms of a DVT that has traveled to the lungs (pulmonary embolism, PE) usually start suddenly and include:  Shortness of breath while active or at rest.  Coughing or coughing up blood or blood-tinged mucus.  Chest pain that is often worse with deep breaths.  Rapid or irregular heartbeat.  Feeling light-headed or dizzy.  Fainting.  Feeling anxious.  Sweating.  There may also be pain and swelling in a leg if that is where the blood clot started. These symptoms may represent a serious problem that is an emergency. Do not wait to see if the symptoms will go away. Get medical help right away. Call your local  emergency services (911 in the [place] Do not drive yourself to the hospital. How is this diagnosed? Your health care provider will take a medical history and perform a physical exam. You may also have other tests, including:  Blood tests to assess the clotting properties of your blood.  Imaging tests, such as CT, ultrasound, MRI, X-ray, and other tests to see if you have clots anywhere in your body.  How is this treated? After a DVT is identified, it can be treated. The type of treatment that you receive depends on many factors, such as the cause of your  DVT, your risk for bleeding or developing more clots, and other medical conditions that you have. Sometimes, a combination of treatments is necessary. Treatment options may be combined and include:  Monitoring the blood clot with ultrasound.  Taking medicines by mouth, such as newer blood thinners (anticoagulants), thrombolytics, or warfarin.  Taking anticoagulant medicine by injection or through an IV tube.  Wearing compression stockings or using different types ofdevices.  Surgery (rare) to remove the blood clot or to place a filter in your abdomen to stop the blood clot from traveling to your lungs.  Treatments for a DVT are often divided into immediate treatment and long-term treatment (up to 3 months after DVT). You can work with your health care provider to choose the treatment program that is best for you. Follow these instructions at home: If you are taking a newer oral anticoagulant:  Take the medicine every single day at the same time each day.  Understand what foods and drugs interact with this medicine.  Understand that there are no regular blood tests required when using this medicine.  Understand the side effects of this medicine, including excessive bruising or bleeding. Ask your health care provider or pharmacist about other possible side effects. If you are taking warfarin:  Understand how to take warfarin and know which foods can affect how warfarin works in Public relations account executiveyour body.  Understand that it is dangerous to take too much or too little warfarin. Too much warfarin increases the risk of bleeding. Too little warfarin continues to allow the risk for blood clots.  Follow your PT and INR blood testing schedule. The PT and INR results allow your health care provider to adjust your dose of warfarin. It is very important that you have your PT and INR tested as often as told by your health care provider.  Avoid major changes in your diet, or tell your health care provider before  you change your diet. Arrange a visit with a registered dietitian to answer your questions. Many foods, especially foods that are high in vitamin K, can interfere with warfarin and affect the PT and INR results. Eat a consistent amount of foods that are high in vitamin K, such as: ? Spinach, kale, broccoli, cabbage, collard greens, turnip greens, Brussels sprouts, peas, cauliflower, seaweed, and parsley. ? Beef liver and pork liver. ? Green tea. ? Soybean oil.  Tell your health care provider about any and all medicines, vitamins, and supplements that you take, including aspirin and other over-the-counter anti-inflammatory medicines. Be especially cautious with aspirin and anti-inflammatory medicines. Do not take those before you ask your health care provider if it is safe to do so. This is important because many medicines can interfere with warfarin and affect the PT and INR results.  Do not start or stop taking any over-the-counter or prescription medicine unless your health care provider or pharmacist tells you to do so. If  you take warfarin, you will also need to do these things:  Hold pressure over cuts for longer than usual.  Tell your dentist and other health care providers that you are taking warfarin before you have any procedures in which bleeding may occur.  Avoid alcohol or drink very small amounts. Tell your health care provider if you change your alcohol intake.  Do not use tobacco products, including cigarettes, chewing tobacco, and e-cigarettes. If you need help quitting, ask your health care provider.  Avoid contact sports.  General instructions  Take over-the-counter and prescription medicines only as told by your health care provider. Anticoagulant medicines can have side effects, including easy bruising and difficulty stopping bleeding. If you are prescribed an anticoagulant, you will also need to do these things: ? Hold pressure over cuts for longer than usual. ? Tell  your dentist and other health care providers that you are taking anticoagulants before you have any procedures in which bleeding may occur. ? Avoid contact sports.  Wear a medical alert bracelet or carry a medical alert card that says you have had a PE.  Ask your health care provider how soon you can go back to your normal activities. Stay active to prevent new blood clots from forming.  Make sure to exercise while traveling or when you have been sitting or standing for a long period of time. It is very important to exercise. Exercise your legs by walking or by tightening and relaxing your leg muscles often. Take frequent walks.  Wear compression stockings as told by your health care provider to help prevent more blood clots from forming.  Do not use tobacco products, including cigarettes, chewing tobacco, and e-cigarettes. If you need help quitting, ask your health care provider.  Keep all follow-up appointments with your health care provider. This is important. How is this prevented? Take these actions to decrease your risk of developing another DVT:  Exercise regularly. For at least 30 minutes every day, engage in: ? Activity that involves moving your arms and legs. ? Activity that encourages good blood flow through your body by increasing your heart rate.  Exercise your arms and legs every hour during long-distance travel (over 4 hours). Drink plenty of water and avoid drinking alcohol while traveling.  Avoid sitting or lying in bed for long periods of time without moving your legs.  Maintain a weight that is appropriate for your height. Ask your health care provider what weight is healthy for you.  If you are a woman who is over 41 years of age, avoid unnecessary use of medicines that contain estrogen. These include birth control pills.  Do not smoke, especially if you take estrogen medicines. If you need help quitting, ask your health care provider.  If you are hospitalized,  prevention measures may include:  Early walking after surgery, as soon as your health care provider says that it is safe.  Receiving anticoagulants to prevent blood clots.If you cannot take anticoagulants, other options may be available, such as wearing compression stockings or using different types of devices.  Get help right away if:  You have new or increased pain, swelling, or redness in an arm or leg.  You have numbness or tingling in an arm or leg.  You have shortness of breath while active or at rest.  You have chest pain.  You have a rapid or irregular heartbeat.  You feel light-headed or dizzy.  You cough up blood.  You notice blood in your vomit, bowel  movement, or urine. These symptoms may represent a serious problem that is an emergency. Do not wait to see if the symptoms will go away. Get medical help right away. Call your local emergency services (911 in the [place] Do not drive yourself to the hospital. This information is not intended to replace advice given to you by your health care provider. Make sure you discuss any questions you have with your health care provider. Document Released: 11/06/2005 Document Revised: 04/13/2016 Document Reviewed: 03/03/2015 Elsevier Interactive Patient Education  2017 [employer]  Admissions at 8 am for your work-in appointment.

## 2017-10-16 NOTE — MAU Provider Note (Signed)
Patient Monica [name] is a 41 y.o. G2P1000 at 5037w1d here with complaints of  Right leg pain. She thinks she has a blood clot in her leg. She denies bleeding, leaking of fluid, or decreased fetal movements. She endorses some contractions but does not feel that she is in labor.   Her history is significant for HELLP in her prior pregnancy with c/section. She has a repeat c/section scheduled for 12-10.    History     CSN: [id number]  Arrival date and time: 10/16/17 1843   None     No chief complaint on file.  HPI Patient says that last night she was woken up with sudden pain in her right leg behind her knee. Her husband looked at it and told her that her popliteal area was "much" larger on the right leg than the left leg. She says last night it looked pink and swollen. She had a varicose vein there that was bothering her and now she feels like she can't "feel" the vein anymore.  She does not have in decrease in sensation in that leg. At the current time she denies heat, redness or bruising. 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 OB History    Gravida Para Term Preterm AB Living   2 1 1     1    SAB TAB Ectopic Multiple Live Births                  Past Medical History:  Diagnosis Date  . Medical history non-contributory     Past Surgical History:  Procedure Laterality Date  . ADENOIDECTOMY    . CESAREAN SECTION  2013  . TONSILLECTOMY      Family History  Problem Relation Age of Onset  . Allergic rhinitis Neg Hx   . Angioedema Neg Hx   . Asthma Neg Hx   . Atopy Neg Hx   . Eczema Neg Hx   . Immunodeficiency Neg Hx   . Urticaria Neg Hx     Social History   Tobacco Use  . Smoking status: Never Smoker  . Smokeless tobacco: Never Used  Substance Use Topics  . Alcohol use: No  . Drug use: No    Allergies: No Known Allergies  Medications Prior to Admission  Medication Sig Dispense Refill Last Dose  . cetirizine (ZYRTEC) 10 MG tablet Take by mouth.    10/15/2017 at Unknown time  . fluticasone (FLONASE) 50 MCG/ACT nasal spray Place into the nose.   10/15/2017 at Unknown time  . albuterol (PROAIR HFA) 108 (90 Base) MCG/ACT inhaler    Unknown at Unknown time    Review of Systems  Constitutional: Negative.   HENT: Negative.   Cardiovascular: Positive for leg swelling.       Patient says area behind her knee was swollen last night. It is not swollen now (9 pm).   Gastrointestinal: Negative.   Genitourinary: Negative.   Skin: Negative.   Neurological: Negative.   Hematological: Negative.    Physical Exam   Blood pressure 127/84, pulse (!) 103, temperature 97.6 F (36.4 C), temperature source Oral, SpO2 98 %.  Physical Exam  Constitutional: She is oriented to person, place, and time. She appears well-developed and well-nourished.  HENT:  Head: Normocephalic.  Eyes: Pupils are equal, round, and reactive to light.  Neck: Normal range of motion.  Respiratory: Effort normal.  GI: Soft.  Genitourinary: Vagina normal.  Genitourinary Comments: Cervix is closed, long, posterior.  Musculoskeletal: Normal range of motion.  Neurological: She is alert and oriented to person, place, and time. She has normal reflexes.  Skin: Skin is warm and dry.  Psychiatric: She has a normal mood and affect.  Patient's right calf measures 55 cm; left calf measures 53.5 cm.  Patient's right popliteal circumference measures 53 cm and her left popliteal circumference measures 51cm in circumference.  She has equal range of motion in both legs with no discoloration of her lower extremities. Positive pedal pulses; right popliteal area is tender to the touch, warm but not overly hot. No knots palpated.    Vitals:   10/16/17 1933 10/16/17 2114  BP: 127/84 133/89  Pulse: (!) 103 (!) 118  Temp:  98.5 F (36.9 C)  SpO2:      MAU Course  Procedures  MDM -Case discussed extensively with Dr. Elon SpannerLeger. Given that patient vital signs are stable, and that pain is in  her right leg (not left) and patient has no SOB,  extremity redness, knots, significant swelling, loss of sensation, patient is stable for discharge with plan to have DVT Dopplers in the morning at 8 with [facility].  Lovenox shot NOT given in MAU given that patient is 37 weeks scheduled for surgery on the 10th, and may require a repeat surgery earlier if fetal condition changes.   Patient given specific instructions on where to go for her Dopplers. Patient told to bring her paperwork and to call the MAU if there is any confusion at [facility]. Dr. Elon SpannerLeger will speak to Dr. Renaldo FiddlerAdkins (on-call doctor in am tomorrow)  about patient's pending imaging.    NST: 140 bpm, present acel, decel when patient repositions in bed but otherwise no decels. Mod variability, ctx q 2 min that patient feels only occasionally. Cervical exam not repeated as patient does not have labor complaint.  Assessment and Plan   1. Pain of right lower extremity    2. Patient stable for discharge with strict return precautions overnight and plan to follow-up in the AM at [facility] for Doppler studies.  3. Patient verbalized understanding; all questions answered.   Charlesetta GaribaldiKathryn Lorraine Alexis Reber CNM 10/16/2017, 7:52 PM

## 2017-10-16 NOTE — MAU Note (Signed)
R leg pain/swelling/warmth.

## 2017-10-16 NOTE — Progress Notes (Signed)
R popliteal measurement - 54.8 cm L  Side: 53 cm

## 2017-10-17 ENCOUNTER — Telehealth (HOSPITAL_COMMUNITY): Payer: Self-pay | Admitting: *Deleted

## 2017-10-17 ENCOUNTER — Encounter (HOSPITAL_COMMUNITY): Payer: Self-pay

## 2017-10-17 NOTE — Telephone Encounter (Signed)
Preadmission screen  

## 2017-10-18 ENCOUNTER — Encounter (HOSPITAL_COMMUNITY): Payer: Self-pay

## 2017-10-20 ENCOUNTER — Inpatient Hospital Stay (HOSPITAL_COMMUNITY): Payer: Commercial Managed Care - PPO | Admitting: Anesthesiology

## 2017-10-20 ENCOUNTER — Encounter (HOSPITAL_COMMUNITY): Payer: Self-pay

## 2017-10-20 ENCOUNTER — Inpatient Hospital Stay (HOSPITAL_COMMUNITY)
Admission: AD | Admit: 2017-10-20 | Discharge: 2017-10-22 | DRG: 788 | Disposition: A | Discharge status: Home / Self Care | Payer: Commercial Managed Care - PPO | Source: Ambulatory Visit | Attending: Obstetrics and Gynecology | Admitting: Obstetrics and Gynecology

## 2017-10-20 ENCOUNTER — Encounter (HOSPITAL_COMMUNITY)
Admission: AD | Disposition: A | Discharge status: Home / Self Care | Payer: Self-pay | Source: Ambulatory Visit | Attending: Obstetrics and Gynecology

## 2017-10-20 ENCOUNTER — Other Ambulatory Visit: Payer: Self-pay

## 2017-10-20 DIAGNOSIS — O99824 Streptococcus B carrier state complicating childbirth: Secondary | ICD-10-CM | POA: Diagnosis present

## 2017-10-20 DIAGNOSIS — Z3A37 37 weeks gestation of pregnancy: Secondary | ICD-10-CM

## 2017-10-20 DIAGNOSIS — O9952 Diseases of the respiratory system complicating childbirth: Secondary | ICD-10-CM | POA: Diagnosis present

## 2017-10-20 DIAGNOSIS — J45909 Unspecified asthma, uncomplicated: Secondary | ICD-10-CM | POA: Diagnosis present

## 2017-10-20 DIAGNOSIS — O99214 Obesity complicating childbirth: Secondary | ICD-10-CM | POA: Diagnosis present

## 2017-10-20 DIAGNOSIS — O34211 Maternal care for low transverse scar from previous cesarean delivery: Secondary | ICD-10-CM | POA: Diagnosis not present

## 2017-10-20 DIAGNOSIS — Z98891 History of uterine scar from previous surgery: Secondary | ICD-10-CM

## 2017-10-20 LAB — ABO/RH: ABO/RH(D): O POS

## 2017-10-20 LAB — TYPE AND SCREEN
ABO/RH(D): O POS
ANTIBODY SCREEN: NEGATIVE

## 2017-10-20 LAB — CBC
HCT: 33.5 % — ABNORMAL LOW (ref 36.0–46.0)
Hemoglobin: 11.1 g/dL — ABNORMAL LOW (ref 12.0–15.0)
MCH: 28.6 pg (ref 26.0–34.0)
MCHC: 33.1 g/dL (ref 30.0–36.0)
MCV: 86.3 fL (ref 78.0–100.0)
PLATELETS: 211 10*3/uL (ref 150–400)
RBC: 3.88 MIL/uL (ref 3.87–5.11)
RDW: 14.8 % (ref 11.5–15.5)
WBC: 9.6 10*3/uL (ref 4.0–10.5)

## 2017-10-20 LAB — POCT FERN TEST: POCT Fern Test: POSITIVE

## 2017-10-20 LAB — RPR: RPR Ser Ql: NONREACTIVE

## 2017-10-20 SURGERY — Surgical Case
Anesthesia: Spinal

## 2017-10-20 MED ORDER — PHENYLEPHRINE 40 MCG/ML (10ML) SYRINGE FOR IV PUSH (FOR BLOOD PRESSURE SUPPORT)
PREFILLED_SYRINGE | INTRAVENOUS | Status: AC
  Filled 2017-10-20: qty 10

## 2017-10-20 MED ORDER — PHENYLEPHRINE 8 MG IN D5W 100 ML (0.08MG/ML) PREMIX OPTIME
INJECTION | INTRAVENOUS | Status: DC | PRN
  Administered 2017-10-20: 60 ug/min via INTRAVENOUS

## 2017-10-20 MED ORDER — NALOXONE HCL 0.4 MG/ML IJ SOLN: 0.4000 mg | INTRAMUSCULAR | Status: DC | PRN

## 2017-10-20 MED ORDER — VALACYCLOVIR HCL 500 MG PO TABS
500.0000 mg | ORAL_TABLET | Freq: Every day | ORAL | Status: DC
  Administered 2017-10-21 – 2017-10-22 (×2): 500 mg via ORAL
  Filled 2017-10-20 (×2): qty 1

## 2017-10-20 MED ORDER — PROMETHAZINE HCL 25 MG/ML IJ SOLN: 6.2500 mg | INTRAMUSCULAR | Status: DC | PRN

## 2017-10-20 MED ORDER — OXYCODONE-ACETAMINOPHEN 5-325 MG PO TABS: 1.0000 | ORAL_TABLET | ORAL | Status: DC | PRN

## 2017-10-20 MED ORDER — FLUTICASONE PROPIONATE 50 MCG/ACT NA SUSP
1.0000 | Freq: Every day | NASAL | Status: DC
  Administered 2017-10-21 – 2017-10-22 (×2): 1 via NASAL
  Filled 2017-10-20: qty 16

## 2017-10-20 MED ORDER — SODIUM CHLORIDE 0.9% FLUSH: 3.0000 mL | INTRAVENOUS | Status: DC | PRN

## 2017-10-20 MED ORDER — SCOPOLAMINE 1 MG/3DAYS TD PT72
MEDICATED_PATCH | TRANSDERMAL | Status: AC
  Filled 2017-10-20: qty 1

## 2017-10-20 MED ORDER — SOD CITRATE-CITRIC ACID 500-334 MG/5ML PO SOLN
30.0000 mL | Freq: Once | ORAL | Status: AC
  Administered 2017-10-20: 30 mL via ORAL
  Filled 2017-10-20: qty 15

## 2017-10-20 MED ORDER — DEXTROSE 5 % IV SOLN
3.0000 g | INTRAVENOUS | Status: AC
  Administered 2017-10-20: 3 g via INTRAVENOUS
  Filled 2017-10-20: qty 3000

## 2017-10-20 MED ORDER — SCOPOLAMINE 1 MG/3DAYS TD PT72: 1.0000 | MEDICATED_PATCH | Freq: Once | TRANSDERMAL | Status: DC

## 2017-10-20 MED ORDER — OXYTOCIN 10 UNIT/ML IJ SOLN
INTRAMUSCULAR | Status: AC
  Filled 2017-10-20: qty 4

## 2017-10-20 MED ORDER — MORPHINE SULFATE (PF) 0.5 MG/ML IJ SOLN
INTRAMUSCULAR | Status: AC
  Filled 2017-10-20: qty 10

## 2017-10-20 MED ORDER — DEXAMETHASONE SODIUM PHOSPHATE 4 MG/ML IJ SOLN
INTRAMUSCULAR | Status: AC
  Filled 2017-10-20: qty 1

## 2017-10-20 MED ORDER — LACTATED RINGERS IV SOLN
INTRAVENOUS | Status: DC
  Administered 2017-10-20 (×2): via INTRAVENOUS

## 2017-10-20 MED ORDER — MORPHINE SULFATE (PF) 0.5 MG/ML IJ SOLN
INTRAMUSCULAR | Status: DC | PRN
  Administered 2017-10-20: .2 mg via INTRATHECAL

## 2017-10-20 MED ORDER — KETOROLAC TROMETHAMINE 30 MG/ML IJ SOLN
INTRAMUSCULAR | Status: AC
  Filled 2017-10-20: qty 1

## 2017-10-20 MED ORDER — BUPIVACAINE IN DEXTROSE 0.75-8.25 % IT SOLN
INTRATHECAL | Status: DC | PRN
  Administered 2017-10-20: 1.8 mL via INTRATHECAL

## 2017-10-20 MED ORDER — FENTANYL CITRATE (PF) 100 MCG/2ML IJ SOLN
25.0000 ug | INTRAMUSCULAR | Status: DC | PRN
Start: 2017-10-20 — End: 2017-10-20

## 2017-10-20 MED ORDER — PRENATAL MULTIVITAMIN CH
1.0000 | ORAL_TABLET | Freq: Every day | ORAL | Status: DC
  Administered 2017-10-21 – 2017-10-22 (×2): 1 via ORAL
  Filled 2017-10-20 (×2): qty 1

## 2017-10-20 MED ORDER — SIMETHICONE 80 MG PO CHEW
80.0000 mg | CHEWABLE_TABLET | ORAL | Status: DC
  Administered 2017-10-20 – 2017-10-21 (×2): 80 mg via ORAL
  Filled 2017-10-20 (×2): qty 1

## 2017-10-20 MED ORDER — SIMETHICONE 80 MG PO CHEW: 80.0000 mg | CHEWABLE_TABLET | ORAL | Status: DC | PRN

## 2017-10-20 MED ORDER — LACTATED RINGERS IV BOLUS (SEPSIS)
1000.0000 mL | Freq: Once | INTRAVENOUS | Status: AC
  Administered 2017-10-20: 1000 mL via INTRAVENOUS

## 2017-10-20 MED ORDER — NALOXONE HCL 0.4 MG/ML IJ SOLN
1.0000 ug/kg/h | INTRAMUSCULAR | Status: DC | PRN
  Filled 2017-10-20: qty 5

## 2017-10-20 MED ORDER — MENTHOL 3 MG MT LOZG
1.0000 | LOZENGE | OROMUCOSAL | Status: DC | PRN
  Administered 2017-10-20: 3 mg via ORAL
  Filled 2017-10-20: qty 9

## 2017-10-20 MED ORDER — ONDANSETRON HCL 4 MG/2ML IJ SOLN: 4.0000 mg | Freq: Three times a day (TID) | INTRAMUSCULAR | Status: DC | PRN

## 2017-10-20 MED ORDER — MEPERIDINE HCL 25 MG/ML IJ SOLN: 6.2500 mg | INTRAMUSCULAR | Status: DC | PRN

## 2017-10-20 MED ORDER — KETOROLAC TROMETHAMINE 30 MG/ML IJ SOLN: 30.0000 mg | Freq: Four times a day (QID) | INTRAMUSCULAR | Status: AC | PRN

## 2017-10-20 MED ORDER — DIPHENHYDRAMINE HCL 25 MG PO CAPS: 25.0000 mg | ORAL_CAPSULE | Freq: Four times a day (QID) | ORAL | Status: DC | PRN

## 2017-10-20 MED ORDER — ACETAMINOPHEN 500 MG PO TABS
1000.0000 mg | ORAL_TABLET | Freq: Four times a day (QID) | ORAL | Status: AC
  Administered 2017-10-20 – 2017-10-21 (×4): 1000 mg via ORAL
  Filled 2017-10-20 (×4): qty 2

## 2017-10-20 MED ORDER — OXYTOCIN 40 UNITS IN LACTATED RINGERS INFUSION - SIMPLE MED: 2.5000 [IU]/h | INTRAVENOUS | Status: AC

## 2017-10-20 MED ORDER — PHENYLEPHRINE HCL 10 MG/ML IJ SOLN
INTRAMUSCULAR | Status: DC | PRN
  Administered 2017-10-20 (×2): 120 ug via INTRAVENOUS

## 2017-10-20 MED ORDER — OXYCODONE-ACETAMINOPHEN 5-325 MG PO TABS
2.0000 | ORAL_TABLET | ORAL | Status: DC | PRN
  Filled 2017-10-20: qty 2

## 2017-10-20 MED ORDER — DEXAMETHASONE SODIUM PHOSPHATE 4 MG/ML IJ SOLN
INTRAMUSCULAR | Status: DC | PRN
  Administered 2017-10-20: 4 mg via INTRAVENOUS

## 2017-10-20 MED ORDER — FENTANYL CITRATE (PF) 100 MCG/2ML IJ SOLN
INTRAMUSCULAR | Status: AC
  Filled 2017-10-20: qty 2

## 2017-10-20 MED ORDER — ZOLPIDEM TARTRATE 5 MG PO TABS: 5.0000 mg | ORAL_TABLET | Freq: Every evening | ORAL | Status: DC | PRN

## 2017-10-20 MED ORDER — DIBUCAINE 1 % RE OINT: 1.0000 "application " | TOPICAL_OINTMENT | RECTAL | Status: DC | PRN

## 2017-10-20 MED ORDER — ALBUTEROL SULFATE (2.5 MG/3ML) 0.083% IN NEBU: 2.5000 mg | INHALATION_SOLUTION | Freq: Four times a day (QID) | RESPIRATORY_TRACT | Status: DC | PRN

## 2017-10-20 MED ORDER — DIPHENHYDRAMINE HCL 25 MG PO CAPS
25.0000 mg | ORAL_CAPSULE | ORAL | Status: DC | PRN
  Filled 2017-10-20: qty 1

## 2017-10-20 MED ORDER — IBUPROFEN 600 MG PO TABS
600.0000 mg | ORAL_TABLET | Freq: Four times a day (QID) | ORAL | Status: DC
  Administered 2017-10-20 – 2017-10-22 (×8): 600 mg via ORAL
  Filled 2017-10-20 (×8): qty 1

## 2017-10-20 MED ORDER — NALBUPHINE HCL 10 MG/ML IJ SOLN: 5.0000 mg | Freq: Once | INTRAMUSCULAR | Status: DC | PRN

## 2017-10-20 MED ORDER — WITCH HAZEL-GLYCERIN EX PADS: 1.0000 "application " | MEDICATED_PAD | CUTANEOUS | Status: DC | PRN

## 2017-10-20 MED ORDER — ONDANSETRON HCL 4 MG/2ML IJ SOLN
INTRAMUSCULAR | Status: AC
  Filled 2017-10-20: qty 2

## 2017-10-20 MED ORDER — ACETAMINOPHEN 325 MG PO TABS: 650.0000 mg | ORAL_TABLET | ORAL | Status: DC | PRN

## 2017-10-20 MED ORDER — LACTATED RINGERS IV SOLN
INTRAVENOUS | Status: DC
  Administered 2017-10-20: 16:00:00 via INTRAVENOUS

## 2017-10-20 MED ORDER — KETOROLAC TROMETHAMINE 30 MG/ML IJ SOLN
30.0000 mg | Freq: Four times a day (QID) | INTRAMUSCULAR | Status: AC | PRN
  Administered 2017-10-20: 30 mg via INTRAMUSCULAR

## 2017-10-20 MED ORDER — NALBUPHINE HCL 10 MG/ML IJ SOLN
5.0000 mg | INTRAMUSCULAR | Status: DC | PRN
  Administered 2017-10-20: 0.5 mg via INTRAVENOUS
  Filled 2017-10-20: qty 1

## 2017-10-20 MED ORDER — SCOPOLAMINE 1 MG/3DAYS TD PT72
MEDICATED_PATCH | TRANSDERMAL | Status: DC | PRN
  Administered 2017-10-20: 1 via TRANSDERMAL

## 2017-10-20 MED ORDER — DEXTROSE 5 % IV SOLN
INTRAVENOUS | Status: AC
  Filled 2017-10-20: qty 3000

## 2017-10-20 MED ORDER — DIPHENHYDRAMINE HCL 50 MG/ML IJ SOLN: 12.5000 mg | INTRAMUSCULAR | Status: DC | PRN

## 2017-10-20 MED ORDER — SENNOSIDES-DOCUSATE SODIUM 8.6-50 MG PO TABS
2.0000 | ORAL_TABLET | ORAL | Status: DC
  Administered 2017-10-20 – 2017-10-21 (×2): 2 via ORAL
  Filled 2017-10-20 (×2): qty 2

## 2017-10-20 MED ORDER — COCONUT OIL OIL
1.0000 "application " | TOPICAL_OIL | Status: DC | PRN
  Filled 2017-10-20: qty 120

## 2017-10-20 MED ORDER — OXYTOCIN 10 UNIT/ML IJ SOLN
INTRAVENOUS | Status: DC | PRN
  Administered 2017-10-20: 40 [IU] via INTRAVENOUS

## 2017-10-20 MED ORDER — SODIUM CHLORIDE 0.9 % IR SOLN
Status: DC | PRN
  Administered 2017-10-20: 1000 mL

## 2017-10-20 MED ORDER — FAMOTIDINE IN NACL 20-0.9 MG/50ML-% IV SOLN
20.0000 mg | Freq: Once | INTRAVENOUS | Status: AC
  Administered 2017-10-20: 20 mg via INTRAVENOUS
  Filled 2017-10-20: qty 50

## 2017-10-20 MED ORDER — TETANUS-DIPHTH-ACELL PERTUSSIS 5-2.5-18.5 LF-MCG/0.5 IM SUSP: 0.5000 mL | Freq: Once | INTRAMUSCULAR | Status: DC

## 2017-10-20 MED ORDER — ONDANSETRON HCL 4 MG/2ML IJ SOLN
INTRAMUSCULAR | Status: DC | PRN
  Administered 2017-10-20: 4 mg via INTRAVENOUS

## 2017-10-20 MED ORDER — NALBUPHINE HCL 10 MG/ML IJ SOLN: 5.0000 mg | INTRAMUSCULAR | Status: DC | PRN

## 2017-10-20 MED ORDER — SIMETHICONE 80 MG PO CHEW
80.0000 mg | CHEWABLE_TABLET | Freq: Three times a day (TID) | ORAL | Status: DC
  Administered 2017-10-20 – 2017-10-22 (×5): 80 mg via ORAL
  Filled 2017-10-20 (×5): qty 1

## 2017-10-20 MED ORDER — FENTANYL CITRATE (PF) 100 MCG/2ML IJ SOLN
INTRAMUSCULAR | Status: DC | PRN
  Administered 2017-10-20: 10 ug via INTRATHECAL

## 2017-10-20 SURGICAL SUPPLY — 34 items
BENZOIN TINCTURE PRP APPL 2/3 (GAUZE/BANDAGES/DRESSINGS) ×3 IMPLANT
CHLORAPREP W/TINT 26ML (MISCELLANEOUS) ×3 IMPLANT
CLAMP CORD UMBIL (MISCELLANEOUS) ×3 IMPLANT
CLOSURE WOUND 1/2 X4 (GAUZE/BANDAGES/DRESSINGS) ×1
CLOTH BEACON ORANGE TIMEOUT ST (SAFETY) ×3 IMPLANT
DERMABOND ADVANCED (GAUZE/BANDAGES/DRESSINGS) ×2
DERMABOND ADVANCED .7 DNX12 (GAUZE/BANDAGES/DRESSINGS) ×1 IMPLANT
DRSG OPSITE POSTOP 4X10 (GAUZE/BANDAGES/DRESSINGS) ×3 IMPLANT
ELECT REM PT RETURN 9FT ADLT (ELECTROSURGICAL) ×3
ELECTRODE REM PT RTRN 9FT ADLT (ELECTROSURGICAL) ×1 IMPLANT
EXTRACTOR VACUUM M CUP 4 TUBE (SUCTIONS) ×2 IMPLANT
EXTRACTOR VACUUM M CUP 4' TUBE (SUCTIONS) ×1
GLOVE BIO SURGEON STRL SZ 6.5 (GLOVE) ×2 IMPLANT
GLOVE BIO SURGEONS STRL SZ 6.5 (GLOVE) ×1
GLOVE BIOGEL PI IND STRL 7.0 (GLOVE) ×2 IMPLANT
GLOVE BIOGEL PI INDICATOR 7.0 (GLOVE) ×4
GOWN STRL REUS W/TWL LRG LVL3 (GOWN DISPOSABLE) ×6 IMPLANT
HOVERMATT SINGLE USE (MISCELLANEOUS) ×3 IMPLANT
KIT ABG SYR 3ML LUER SLIP (SYRINGE) IMPLANT
NEEDLE HYPO 25X5/8 SAFETYGLIDE (NEEDLE) IMPLANT
NS IRRIG 1000ML POUR BTL (IV SOLUTION) ×3 IMPLANT
PACK C SECTION WH (CUSTOM PROCEDURE TRAY) ×3 IMPLANT
PAD OB MATERNITY 4.3X12.25 (PERSONAL CARE ITEMS) ×3 IMPLANT
PENCIL SMOKE EVAC W/HOLSTER (ELECTROSURGICAL) ×3 IMPLANT
STRIP CLOSURE SKIN 1/2X4 (GAUZE/BANDAGES/DRESSINGS) ×2 IMPLANT
SUT CHROMIC 0 CT 802H (SUTURE) IMPLANT
SUT CHROMIC 0 CTX 36 (SUTURE) ×9 IMPLANT
SUT MON AB-0 CT1 36 (SUTURE) ×3 IMPLANT
SUT PDS AB 0 CTX 60 (SUTURE) ×3 IMPLANT
SUT PLAIN 0 NONE (SUTURE) IMPLANT
SUT VIC AB 4-0 KS 27 (SUTURE) IMPLANT
SYR BULB 3OZ (MISCELLANEOUS) ×3 IMPLANT
TOWEL OR 17X24 6PK STRL BLUE (TOWEL DISPOSABLE) ×3 IMPLANT
TRAY FOLEY BAG SILVER LF 14FR (SET/KITS/TRAYS/PACK) ×3 IMPLANT

## 2017-10-20 NOTE — Brief Op Note (Signed)
10/20/2017  8:50 AM  PATIENT:  Monica [name]  41 y.o. female  PRE-OPERATIVE DIAGNOSIS:   IUP at 7937 w 5 days Previous C Section SROM  POST-OPERATIVE DIAGNOSIS:   Same  PROCEDURE:  Procedure(s) with comments: REPEAT CESAREAN SECTION WITH SUPRA CLITORAL SKIN TAG REMOVAL (N/A) - Repeat edc 11/05/17 nkda   SURGEON:  Surgeon(s) and Role:    Marcelle OverlieMichelle Khyri Hinzman  PHYSICIAN ASSISTANT:   ASSISTANTS: none   ANESTHESIA:   spinal  EBL:  575 mL   BLOOD ADMINISTERED:none  DRAINS: Urinary Catheter (Foley)   LOCAL MEDICATIONS USED:  NONE  SPECIMEN:  No Specimen  DISPOSITION OF SPECIMEN:  N/A  COUNTS:  YES  TOURNIQUET:  * No tourniquets in log *  DICTATION: .Other Dictation: Dictation Number [id number]  PLAN OF CARE: Admit to inpatient   PATIENT DISPOSITION:  PACU - hemodynamically stable.   Delay start of Pharmacological VTE agent (>24hrs) due to surgical blood loss or risk of bleeding: yes

## 2017-10-20 NOTE — Op Note (Signed)
Monica [name]:  [name], Monica                ACCOUNT NO.:  [id number]  MEDICAL RECORD NO.:  [id number]  LOCATION:                                 FACILITY:  PHYSICIAN:  Kevis Qu L. Vincente PoliGrewal, M.D.    DATE OF BIRTH:  DATE OF PROCEDURE:  10/20/2017 DATE OF DISCHARGE:                              OPERATIVE REPORT   PREOPERATIVE DIAGNOSES:  Intrauterine pregnancy at 2237 and 5, spontaneous rupture of membranes, and previous cesarean section.  POSTOPERATIVE DIAGNOSES:  Intrauterine pregnancy at 37 and 5, spontaneous rupture of membranes, and previous cesarean section.  PROCEDURE:  Repeat low transverse cesarean section.  SURGEON:  Shanvi Moyd L. Vincente PoliGrewal, M.D.  ANESTHESIA:  Spinal.  ESTIMATED BLOOD LOSS:  575 mL.  COMPLICATIONS:  None.  DRAINS:  Foley.  PATHOLOGY:  None.  DESCRIPTION OF PROCEDURE:  The patient was taken to the operating room. She was prepped and draped in the usual sterile fashion.  A Foley catheter had been inserted.  A low transverse incision was made, carried down to the fascia.  The fascia was scored in the midline, extended laterally.  The rectus muscles were separated in the midline.  The peritoneum was entered bluntly.  The peritoneal incision was then stretched.  The lower uterine segment was identified.  The bladder flap was created sharply and then digitally.  There was noted to be a very dense adhesion of the anterior abdominal wall to the upper part of the uterus which I did not take down because it was very thick.  This will be important to remember for future C-section.  A low-transverse incision was made.  The baby was delivered easily with a vacuum extractor, was large for gestational age, with a female infant.  The cord was clamped and cut.  The baby was handed to the waiting Neonatal team. The placenta was manually removed, noted to be normal and intact with a 3-vessel cord.  We were not able to exteriorize the uterus because of the adhesions; however, I was  able to clear off the uterus, clean it out thoroughly, and closed in 1 layer using 0 chromic in a running lock stitch.  The peritoneum was reapproximated using 0 Vicryl.  The fascia was closed using 0 Vicryl, starting each corner, meeting in the midline.  After irrigation of the subcu layer, the subcu was closed with interrupted using plain gut.  The skin was closed with 4-0 Vicryl on a Keith needle.  All sponge, lap, and instrument counts were correct x2.  The patient went to recovery room in stable condition.     Jhovanny Guinta L. Vincente PoliGrewal, M.D.     Florestine AversMLG/MEDQ  D:  10/20/2017  T:  10/20/2017  Job:  [id number]

## 2017-10-20 NOTE — Anesthesia Procedure Notes (Signed)
Spinal  Patient location during procedure: OR Start time: 10/20/2017 7:57 AM End time: 10/20/2017 7:59 AM Staffing Anesthesiologist: Achille RichHodierne, Doniesha Landau, MD Performed: anesthesiologist  Preanesthetic Checklist Completed: patient identified, surgical consent, pre-op evaluation, timeout performed, IV checked, risks and benefits discussed and monitors and equipment checked Spinal Block Patient position: sitting Prep: DuraPrep Patient monitoring: cardiac monitor, continuous pulse ox and blood pressure Approach: midline Location: L3-4 Injection technique: single-shot Needle Needle type: Pencan  Needle gauge: 24 G Needle length: 9 cm Assessment Sensory level: T10 Additional Notes Functioning IV was confirmed and monitors were applied. Sterile prep and drape, including hand hygiene and sterile gloves were used. The patient was positioned and the spine was prepped. The skin was anesthetized with lidocaine.  Free flow of clear CSF was obtained prior to injecting local anesthetic into the CSF.  The spinal needle aspirated freely following injection.  The needle was carefully withdrawn.  The patient tolerated the procedure well.

## 2017-10-20 NOTE — H&P (Signed)
Monica [name] is a 41 y.o.G 2 P 1 at 37 w 5 days presents with SROM.  Previous C Section for HELLP Syndrome  OB History    Gravida Para Term Preterm AB Living   2 1 1     1    SAB TAB Ectopic Multiple Live Births           1     Past Medical History:  Diagnosis Date  . AMA (advanced maternal age) multigravida 35+   . Anxiety   . Asthma   . Depression    had PP depression  . HSV (herpes simplex virus) anogenital infection   . Hx of HELLP syndrome, currently pregnant   . Vaginal Pap smear, abnormal    Past Surgical History:  Procedure Laterality Date  . ADENOIDECTOMY    . bone spur removal    . CESAREAN SECTION  2013  . COLPOSCOPY    . tonsil suregery     additional tissue removal  . TONSILLECTOMY     Family History: family history includes Hashimoto's thyroiditis in her mother; Hypertension in her father and mother; Skin cancer in her paternal grandfather; Stroke in her paternal grandfather. Social History:  reports that  has never smoked. she has never used smokeless tobacco. She reports that she does not drink alcohol or use drugs.     Maternal Diabetes: No Genetic Screening: Normal Maternal Ultrasounds/Referrals: Normal Fetal Ultrasounds or other Referrals:  None Maternal Substance Abuse:  No Significant Maternal Medications:  None Significant Maternal Lab Results:  None Other Comments:  None  Review of Systems  All other systems reviewed and are negative.  Maternal Medical History:  Reason for admission: Rupture of membranes and contractions.       Blood pressure 127/81, pulse (!) 103, temperature 97.8 F (36.6 C), temperature source Oral, resp. rate 18, height 5\' 8"  (1.727 m), weight 136.1 kg (300 lb), SpO2 96 %. Maternal Exam:  Abdomen: Surgical scars: low transverse.   Fetal presentation: vertex     Fetal Exam Fetal State Assessment: Category I - tracings are normal.     Physical Exam  Nursing note and vitals reviewed. Constitutional: She  appears well-developed.  HENT:  Head: Normocephalic.  Eyes: Pupils are equal, round, and reactive to light.  Neck: Normal range of motion.  Cardiovascular: Normal rate and regular rhythm.  Respiratory: Effort normal.  GI: Soft.    Prenatal labs: ABO, Rh: --/--/O POS (12/01 0615) Antibody: NEG (12/01 0615) Rubella: Immune (05/09 0000) RPR: Nonreactive (05/09 0000)  HBsAg: Negative (05/09 0000)  HIV: Non-reactive (05/09 0000)  GBS: Positive (05/09 0000)   Assessment/Plan: IUP at 37 w 5 days SROM Previous C Section Repeat LTCS  Glenford Garis L 10/20/2017, 7:42 AM

## 2017-10-20 NOTE — Transfer of Care (Signed)
Immediate Anesthesia Transfer of Care Note  Patient: Monica [name]  Procedure(s) Performed: REPEAT CESAREAN SECTION WITH SUPRA CLITORAL SKIN TAG REMOVAL (N/A )  Patient Location: PACU  Anesthesia Type:Regional and Spinal  Level of Consciousness: awake, alert , oriented and patient cooperative  Airway & Oxygen Therapy: Patient Spontanous Breathing  Post-op Assessment: Report given to RN and Post -op Vital signs reviewed and stable  Post vital signs: Reviewed and stable  Last Vitals:  Vitals:   10/20/17 0908 10/20/17 0909  BP:  (!) 121/59  Pulse: 93 87  Resp:  18  Temp:  36.8 C  SpO2: 97% 97%    Last Pain:  Vitals:   10/20/17 0909  TempSrc: Oral  PainSc:       Patients Stated Pain Goal: 5 (10/20/17 0556)  Complications: No apparent anesthesia complications

## 2017-10-20 NOTE — Anesthesia Postprocedure Evaluation (Signed)
Anesthesia Post Note  Patient: Monica [name]  Procedure(s) Performed: REPEAT CESAREAN SECTION WITH SUPRA CLITORAL SKIN TAG REMOVAL (N/A )     Patient location during evaluation: Mother Baby Anesthesia Type: Spinal Level of consciousness: awake and alert and oriented Pain management: pain level controlled Vital Signs Assessment: post-procedure vital signs reviewed and stable Respiratory status: spontaneous breathing and nonlabored ventilation Cardiovascular status: stable Postop Assessment: no headache, patient able to bend at knees, no backache, no apparent nausea or vomiting, adequate PO intake and spinal receding Anesthetic complications: no    Last Vitals:  Vitals:   10/20/17 1130 10/20/17 1240  BP: 130/82 (!) 155/88  Pulse: 86 90  Resp: 20 18  Temp: 36.6 C 37.2 C  SpO2: 95% 95%    Last Pain:  Vitals:   10/20/17 1240  TempSrc:   PainSc: 6    Pain Goal: Patients Stated Pain Goal: 6 (10/20/17 1130)               Laban EmperorMalinova,Mariateresa Batra Hristova

## 2017-10-20 NOTE — MAU Note (Signed)
Water broke at 0351 clear fluid. Baby moving, he's slowed down over last 24 hours. Scheduled C/S 12/10- repeat. No bleeding. Contractions occasional.

## 2017-10-20 NOTE — Progress Notes (Signed)
OBIX not tracing from [id number]. RN in room and can hear FHT.

## 2017-10-20 NOTE — Anesthesia Preprocedure Evaluation (Addendum)
Anesthesia Evaluation  Patient identified by MRN, date of birth, ID band Patient awake    Reviewed: Allergy & Precautions, NPO status , Patient's Chart, lab work & pertinent test results  History of Anesthesia Complications (+) PONV  Airway Mallampati: II  TM Distance: >3 FB Neck ROM: Full    Dental  (+) Dental Advisory Given   Pulmonary asthma ,    Pulmonary exam normal breath sounds clear to auscultation       Cardiovascular negative cardio ROS Normal cardiovascular exam Rhythm:Regular Rate:Normal     Neuro/Psych Anxiety Depression negative neurological ROS     GI/Hepatic negative GI ROS, Neg liver ROS,   Endo/Other  Morbid obesity  Renal/GU negative Renal ROS  negative genitourinary   Musculoskeletal negative musculoskeletal ROS (+)   Abdominal   Peds  Hematology negative hematology ROS (+)   Anesthesia Other Findings HSV  Reproductive/Obstetrics (+) Pregnancy Hx HELLP with prior pregnancy                             Anesthesia Physical Anesthesia Plan  ASA: III  Anesthesia Plan: Spinal   Post-op Pain Management:    Induction:   PONV Risk Score and Plan: Treatment may vary due to age or medical condition  Airway Management Planned: Natural Airway and Nasal Cannula  Additional Equipment: None  Intra-op Plan:   Post-operative Plan:   Informed Consent: I have reviewed the patients History and Physical, chart, labs and discussed the procedure including the risks, benefits and alternatives for the proposed anesthesia with the patient or authorized representative who has indicated his/her understanding and acceptance.   Dental advisory given  Plan Discussed with: CRNA  Anesthesia Plan Comments:         Anesthesia Quick Evaluation

## 2017-10-21 LAB — CBC
HCT: 30.2 % — ABNORMAL LOW (ref 36.0–46.0)
HEMOGLOBIN: 10 g/dL — AB (ref 12.0–15.0)
MCH: 29.2 pg (ref 26.0–34.0)
MCHC: 33.1 g/dL (ref 30.0–36.0)
MCV: 88.3 fL (ref 78.0–100.0)
PLATELETS: 206 10*3/uL (ref 150–400)
RBC: 3.42 MIL/uL — AB (ref 3.87–5.11)
RDW: 15.1 % (ref 11.5–15.5)
WBC: 13.7 10*3/uL — AB (ref 4.0–10.5)

## 2017-10-21 MED ORDER — OXYCODONE HCL 5 MG PO TABS
5.0000 mg | ORAL_TABLET | ORAL | Status: DC | PRN
  Administered 2017-10-21 – 2017-10-22 (×5): 5 mg via ORAL
  Filled 2017-10-21 (×5): qty 1

## 2017-10-21 NOTE — Clinical Social Work Note (Signed)
MOB was referred for history of depression/anxiety. * Referral screened out by Clinical Social Worker because none of the following criteria appear to apply: ~ History of anxiety/depression during this pregnancy, or of post-partum depression. ~ Diagnosis of anxiety and/or depression within last 3 years OR * MOB's symptoms currently being treated with medication and/or therapy. Please contact the Clinical Social Worker if needs arise, by [facility], or if MOB scores greater than 9/yes to question 10 on Edinburgh Postpartum Depression Screen.  Clinical Social Worker confirmed with patient at bedside that there are no additional/recent concerns with depression and/or anxiety.  No further social work needs.  Macario GoldsJesse Marita Burnsed, Kentucky[place]CS[place] [phone]

## 2017-10-21 NOTE — Progress Notes (Signed)
Called regarding pt's honeycomb dressing falling off after shower.  Ok to place a new honeycomb.  Verbal orders placed.

## 2017-10-21 NOTE — Progress Notes (Signed)
Patient doing well  No complaints.  BP 117/65 (BP Location: Left Arm)   Pulse 91   Temp 98.3 F (36.8 C) (Oral)   Resp 16   Ht 5\' 8"  (1.727 m)   Wt 136.1 kg (300 lb)   SpO2 95%   Breastfeeding? Unknown   BMI 45.61 kg/m  Abdomen is soft and non tender Bandage clean and dry  Results for orders placed or performed during the hospital encounter of 10/20/17 (from the past 24 hour(s))  CBC     Status: Abnormal   Collection Time: 10/21/17  5:06 AM  Result Value Ref Range   WBC 13.7 (H) 4.0 - 10.5 K/uL   RBC 3.42 (L) 3.87 - 5.11 MIL/uL   Hemoglobin 10.0 (L) 12.0 - 15.0 g/dL   HCT 78.230.2 (L) 95.636.0 - 21.346.0 %   MCV 88.3 78.0 - 100.0 fL   MCH 29.2 26.0 - 34.0 pg   MCHC 33.1 30.0 - 36.0 g/dL   RDW 08.615.1 57.811.5 - 46.915.5 %   Platelets 206 150 - 400 K/uL   POD # 1  Doing well Routine care circ today

## 2017-10-21 NOTE — Lactation Note (Signed)
This note was copied from a baby's chart. Lactation Consultation Note Baby 18 hrs old. Mom's 2nd baby. Mom BF her 1st child for 13 months. Mom states this baby is doing great, feels comfortable things are going well at this time.  Reviewed newborn behavior, encouraged STS, I&O.  Encouraged mom to call for assistance or questions. Mom didn't have any questions or needs at this time.  [facility]/LC brochure given w/resources, support groups and LC services.  Patient Name: Monica [name] WUJWJ'XBaxter Flatteryoday's Date: 10/21/2017 Reason for consult: Initial assessment   Maternal Data Does the patient have breastfeeding experience prior to this delivery?: Yes  Feeding    LATCH Score                   Interventions Interventions: Breast feeding basics reviewed  Lactation Tools Discussed/Used     Consult Status Consult Status: Follow-up Date: 10/22/17 Follow-up type: In-patient    Siraj Dermody, Diamond NickelLAURA G 10/21/2017, 3:22 AM

## 2017-10-22 ENCOUNTER — Ambulatory Visit: Payer: Self-pay

## 2017-10-22 MED ORDER — OXYCODONE-ACETAMINOPHEN 5-325 MG PO TABS: 1.0000 | ORAL_TABLET | ORAL | 0 refills | Status: DC | PRN

## 2017-10-22 MED ORDER — IBUPROFEN 600 MG PO TABS: 600.0000 mg | ORAL_TABLET | Freq: Four times a day (QID) | ORAL | 0 refills | Status: DC

## 2017-10-22 NOTE — Lactation Note (Signed)
This note was copied from a baby's chart. Lactation Consultation Note: Mother reports that her nipples are sore. Staff nurse gave comfort gels. Observed that mother has a crack on the left nipple and rt nipple red. [facility] Advised mother to rotate positions frequently . Advised mother to latch infant using an off sided latch. Mother has carpal tunnel and has a hard time supporting infant at the breast.  Observed that infant has a tight anterior frenula. Advised mother to see specialist for an evaluation of tongue mobility if she continues to have painful nipples. Encouraged mother to continue to cue base feed and feed at least 8-12 times in 24 hours. Discussed treatment and prevention of engorgement.  Mother receptive to all teaching.   Patient Name: Monica [name] AVWUJ'WToday's Date: 10/22/2017 Reason for consult: Follow-up assessment   Maternal Data    Feeding Feeding Type: Breast Fed  LATCH Score Latch: Grasps breast easily, tongue down, lips flanged, rhythmical sucking.  Audible Swallowing: A few with stimulation  Type of Nipple: Everted at rest and after stimulation  Comfort (Breast/Nipple): Engorged, cracked, bleeding, large blisters, severe discomfort(crack on the rt nipple. comfort gels)  Hold (Positioning): Assistance needed to correctly position infant at breast and maintain latch.  LATCH Score: 6  Interventions    Lactation Tools Discussed/Used     Consult Status Consult Status: Complete    Michel BickersKendrick, Shweta Aman McCoy 10/22/2017, 12:29 PM

## 2017-10-22 NOTE — Discharge Summary (Signed)
Obstetric Discharge Summary Reason for Admission: onset of labor Prenatal Procedures: ultrasound Intrapartum Procedures: cesarean: low cervical, transverse Postpartum Procedures: none Complications-Operative and Postpartum: none Hemoglobin  Date Value Ref Range Status  10/21/2017 10.0 (L) 12.0 - 15.0 g/dL Final   HCT  Date Value Ref Range Status  10/21/2017 30.2 (L) 36.0 - 46.0 % Final    Physical Exam:  General: alert and cooperative Lochia: appropriate Uterine Fundus: firm Incision: healing well, no significant drainage DVT Evaluation: No evidence of DVT seen on physical exam.  Discharge Diagnoses: Term Pregnancy-delivered  Discharge Information: Date: 10/22/2017 Activity: pelvic rest Diet: routine Medications: PNV, Ibuprofen and Percocet Condition: stable Instructions: refer to practice specific booklet Discharge to: home   Newborn Data: Live born female  Birth Weight: 10 lb 3.5 oz (4635 g) APGAR: 9,   Newborn Delivery   Birth date/time:  10/20/2017 08:26:00 Delivery type:  C-Section, Vacuum Assisted C-section categorization:  Repeat     Home with mother.  Monica [name] 10/22/2017, 8:10 AM

## 2017-10-26 ENCOUNTER — Encounter (HOSPITAL_COMMUNITY)
Admission: RE | Admit: 2017-10-26 | Discharge: 2017-10-26 | Disposition: A | Discharge status: Home / Self Care | Payer: Commercial Managed Care - PPO | Source: Ambulatory Visit

## 2017-10-26 HISTORY — DX: Anxiety disorder, unspecified: F41.9

## 2017-10-26 HISTORY — DX: Depression, unspecified: F32.A

## 2017-10-26 HISTORY — DX: Supervision of elderly multigravida, unspecified trimester: O09.529

## 2017-10-26 HISTORY — DX: Supervision of pregnancy with other poor reproductive or obstetric history, unspecified trimester: O09.299

## 2017-10-26 HISTORY — DX: Major depressive disorder, single episode, unspecified: F32.9

## 2017-10-26 HISTORY — DX: Unspecified asthma, uncomplicated: J45.909

## 2017-10-26 HISTORY — DX: Anogenital herpesviral infection, unspecified: A60.9

## 2017-10-26 HISTORY — DX: Unspecified abnormal cytological findings in specimens from vagina: R87.629

## 2017-10-29 ENCOUNTER — Inpatient Hospital Stay (HOSPITAL_COMMUNITY)
Admission: AD | Admit: 2017-10-29 | Payer: Commercial Managed Care - PPO | Source: Ambulatory Visit | Admitting: Obstetrics and Gynecology

## 2017-11-29 DIAGNOSIS — Z1389 Encounter for screening for other disorder: Secondary | ICD-10-CM | POA: Diagnosis not present

## 2018-04-21 DIAGNOSIS — M722 Plantar fascial fibromatosis: Secondary | ICD-10-CM | POA: Diagnosis not present

## 2018-04-26 DIAGNOSIS — M79671 Pain in right foot: Secondary | ICD-10-CM | POA: Diagnosis not present

## 2018-04-26 DIAGNOSIS — M722 Plantar fascial fibromatosis: Secondary | ICD-10-CM | POA: Diagnosis not present

## 2018-08-08 DIAGNOSIS — B369 Superficial mycosis, unspecified: Secondary | ICD-10-CM | POA: Diagnosis not present

## 2018-08-08 DIAGNOSIS — N760 Acute vaginitis: Secondary | ICD-10-CM | POA: Diagnosis not present

## 2018-08-08 DIAGNOSIS — B354 Tinea corporis: Secondary | ICD-10-CM | POA: Diagnosis not present

## 2018-09-03 DIAGNOSIS — N760 Acute vaginitis: Secondary | ICD-10-CM | POA: Diagnosis not present

## 2018-09-03 DIAGNOSIS — R82998 Other abnormal findings in urine: Secondary | ICD-10-CM | POA: Diagnosis not present

## 2018-09-03 DIAGNOSIS — N390 Urinary tract infection, site not specified: Secondary | ICD-10-CM | POA: Diagnosis not present

## 2018-12-17 DIAGNOSIS — Z6841 Body Mass Index (BMI) 40.0 and over, adult: Secondary | ICD-10-CM | POA: Diagnosis not present

## 2018-12-17 DIAGNOSIS — J069 Acute upper respiratory infection, unspecified: Secondary | ICD-10-CM | POA: Diagnosis not present

## 2018-12-17 DIAGNOSIS — R300 Dysuria: Secondary | ICD-10-CM | POA: Diagnosis not present

## 2019-01-09 DIAGNOSIS — Z01419 Encounter for gynecological examination (general) (routine) without abnormal findings: Secondary | ICD-10-CM | POA: Diagnosis not present

## 2019-01-09 DIAGNOSIS — Z6841 Body Mass Index (BMI) 40.0 and over, adult: Secondary | ICD-10-CM | POA: Diagnosis not present

## 2019-01-10 DIAGNOSIS — H5213 Myopia, bilateral: Secondary | ICD-10-CM | POA: Diagnosis not present

## 2019-01-10 DIAGNOSIS — H1789 Other corneal scars and opacities: Secondary | ICD-10-CM | POA: Diagnosis not present

## 2019-09-09 LAB — OB RESULTS CONSOLE GC/CHLAMYDIA
Chlamydia: NEGATIVE
Gonorrhea: NEGATIVE

## 2019-09-09 LAB — OB RESULTS CONSOLE RUBELLA ANTIBODY, IGM: Rubella: IMMUNE

## 2019-09-09 LAB — OB RESULTS CONSOLE ABO/RH: RH Type: POSITIVE

## 2019-09-09 LAB — OB RESULTS CONSOLE HEPATITIS B SURFACE ANTIGEN: Hepatitis B Surface Ag: NEGATIVE

## 2019-09-09 LAB — OB RESULTS CONSOLE RPR: RPR: NONREACTIVE

## 2019-09-09 LAB — OB RESULTS CONSOLE HIV ANTIBODY (ROUTINE TESTING): HIV: NONREACTIVE

## 2019-09-09 LAB — OB RESULTS CONSOLE ANTIBODY SCREEN: Antibody Screen: NEGATIVE

## 2020-03-08 ENCOUNTER — Other Ambulatory Visit: Payer: Self-pay | Admitting: Obstetrics and Gynecology

## 2020-03-10 ENCOUNTER — Inpatient Hospital Stay (HOSPITAL_COMMUNITY)
Admission: AD | Admit: 2020-03-10 | Discharge: 2020-03-10 | Disposition: A | Discharge status: Home / Self Care | Payer: Commercial Managed Care - PPO | Attending: Obstetrics and Gynecology | Admitting: Obstetrics and Gynecology

## 2020-03-10 ENCOUNTER — Other Ambulatory Visit: Payer: Self-pay

## 2020-03-10 ENCOUNTER — Encounter (HOSPITAL_COMMUNITY): Payer: Self-pay | Admitting: Obstetrics and Gynecology

## 2020-03-10 DIAGNOSIS — R519 Headache, unspecified: Secondary | ICD-10-CM | POA: Diagnosis not present

## 2020-03-10 DIAGNOSIS — O09293 Supervision of pregnancy with other poor reproductive or obstetric history, third trimester: Secondary | ICD-10-CM | POA: Diagnosis not present

## 2020-03-10 DIAGNOSIS — O26893 Other specified pregnancy related conditions, third trimester: Secondary | ICD-10-CM | POA: Diagnosis not present

## 2020-03-10 DIAGNOSIS — Z3A35 35 weeks gestation of pregnancy: Secondary | ICD-10-CM | POA: Insufficient documentation

## 2020-03-10 DIAGNOSIS — O133 Gestational [pregnancy-induced] hypertension without significant proteinuria, third trimester: Secondary | ICD-10-CM | POA: Diagnosis not present

## 2020-03-10 DIAGNOSIS — R030 Elevated blood-pressure reading, without diagnosis of hypertension: Secondary | ICD-10-CM | POA: Diagnosis present

## 2020-03-10 LAB — PROTEIN / CREATININE RATIO, URINE
Creatinine, Urine: 251.15 mg/dL
Protein Creatinine Ratio: 0.06 mg/mg{Cre} (ref 0.00–0.15)
Total Protein, Urine: 16 mg/dL

## 2020-03-10 LAB — URINALYSIS, ROUTINE W REFLEX MICROSCOPIC
Bilirubin Urine: NEGATIVE
Glucose, UA: NEGATIVE mg/dL
Hgb urine dipstick: NEGATIVE
Ketones, ur: NEGATIVE mg/dL
Leukocytes,Ua: NEGATIVE
Nitrite: NEGATIVE
Protein, ur: 30 mg/dL — AB
Specific Gravity, Urine: 1.021 (ref 1.005–1.030)
pH: 6 (ref 5.0–8.0)

## 2020-03-10 LAB — COMPREHENSIVE METABOLIC PANEL
ALT: 13 U/L (ref 0–44)
AST: 20 U/L (ref 15–41)
Albumin: 2.7 g/dL — ABNORMAL LOW (ref 3.5–5.0)
Alkaline Phosphatase: 113 U/L (ref 38–126)
Anion gap: 10 (ref 5–15)
BUN: <5 mg/dL — ABNORMAL LOW (ref 6–20)
CO2: 23 mmol/L (ref 22–32)
Calcium: 9.1 mg/dL (ref 8.9–10.3)
Chloride: 104 mmol/L (ref 98–111)
Creatinine, Ser: 0.62 mg/dL (ref 0.44–1.00)
GFR calc Af Amer: >60 mL/min (ref >60)
GFR calc non Af Amer: >60 mL/min (ref >60)
Glucose, Bld: 89 mg/dL (ref 70–99)
Potassium: 3.7 mmol/L (ref 3.5–5.1)
Sodium: 137 mmol/L (ref 135–145)
Total Bilirubin: 0.7 mg/dL (ref 0.3–1.2)
Total Protein: 6.1 g/dL — ABNORMAL LOW (ref 6.5–8.1)

## 2020-03-10 LAB — CBC
HCT: 35.1 % — ABNORMAL LOW (ref 36.0–46.0)
Hemoglobin: 11.6 g/dL — ABNORMAL LOW (ref 12.0–15.0)
MCH: 28.1 pg (ref 26.0–34.0)
MCHC: 33 g/dL (ref 30.0–36.0)
MCV: 85 fL (ref 80.0–100.0)
Platelets: 310 10*3/uL (ref 150–400)
RBC: 4.13 MIL/uL (ref 3.87–5.11)
RDW: 13.6 % (ref 11.5–15.5)
WBC: 9.1 10*3/uL (ref 4.0–10.5)
nRBC: 0 % (ref 0.0–0.2)

## 2020-03-10 MED ORDER — LACTATED RINGERS IV BOLUS
500.0000 mL | Freq: Once | INTRAVENOUS | Status: AC
  Administered 2020-03-10: 500 mL via INTRAVENOUS

## 2020-03-10 MED ORDER — TRAMADOL HCL 50 MG PO TABS: 50.0000 mg | ORAL_TABLET | Freq: Four times a day (QID) | ORAL | 0 refills | Status: DC | PRN

## 2020-03-10 MED ORDER — BUTALBITAL-APAP-CAFFEINE 50-325-40 MG PO TABS
2.0000 | ORAL_TABLET | Freq: Once | ORAL | Status: AC
  Administered 2020-03-10: 2 via ORAL
  Filled 2020-03-10: qty 2

## 2020-03-10 MED ORDER — ONDANSETRON HCL 4 MG PO TABS
4.0000 mg | ORAL_TABLET | Freq: Once | ORAL | Status: AC
  Administered 2020-03-10: 4 mg via ORAL
  Filled 2020-03-10: qty 1

## 2020-03-10 MED ORDER — DIPHENHYDRAMINE HCL 50 MG/ML IJ SOLN
25.0000 mg | Freq: Once | INTRAMUSCULAR | Status: AC
  Administered 2020-03-10: 25 mg via INTRAVENOUS
  Filled 2020-03-10: qty 1

## 2020-03-10 MED ORDER — PROCHLORPERAZINE EDISYLATE 10 MG/2ML IJ SOLN
10.0000 mg | Freq: Once | INTRAMUSCULAR | Status: AC
  Administered 2020-03-10: 10 mg via INTRAVENOUS
  Filled 2020-03-10: qty 2

## 2020-03-10 MED ORDER — DEXAMETHASONE SODIUM PHOSPHATE 10 MG/ML IJ SOLN
10.0000 mg | Freq: Once | INTRAMUSCULAR | Status: AC
  Administered 2020-03-10: 10 mg via INTRAVENOUS
  Filled 2020-03-10: qty 1

## 2020-03-10 NOTE — Discharge Instructions (Signed)
Preeclampsia and Eclampsia Preeclampsia is a serious condition that may develop during pregnancy. This condition causes high blood pressure and increased protein in your urine along with other symptoms, such as headaches and vision changes. These symptoms may develop as the condition gets worse. Preeclampsia may occur at 20 weeks of pregnancy or later. Diagnosing and treating preeclampsia early is very important. If not treated early, it can cause serious problems for you and your baby. One problem it can lead to is eclampsia. Eclampsia is a condition that causes muscle jerking or shaking (convulsions or seizures) and other serious problems for the mother. During pregnancy, delivering your baby may be the best treatment for preeclampsia or eclampsia. For most women, preeclampsia and eclampsia symptoms go away after giving birth. In rare cases, a woman may develop preeclampsia after giving birth (postpartum preeclampsia). This usually occurs within 48 hours after childbirth but may occur up to 6 weeks after giving birth. What are the causes? The cause of preeclampsia is not known. What increases the risk? The following risk factors make you more likely to develop preeclampsia:  Being pregnant for the first time.  Having had preeclampsia during a past pregnancy.  Having a family history of preeclampsia.  Having high blood pressure.  Being pregnant with more than one baby.  Being 35 or older.  Being African-American.  Having kidney disease or diabetes.  Having medical conditions such as lupus or blood diseases.  Being very overweight (obese). What are the signs or symptoms? The most common symptoms are:  Severe headaches.  Vision problems, such as blurred or double vision.  Abdominal pain, especially upper abdominal pain. Other symptoms that may develop as the condition gets worse include:  Sudden weight gain.  Sudden swelling of the hands, face, legs, and feet.  Severe nausea  and vomiting.  Numbness in the face, arms, legs, and feet.  Dizziness.  Urinating less than usual.  Slurred speech.  Convulsions or seizures. How is this diagnosed? There are no screening tests for preeclampsia. Your health care provider will ask you about symptoms and check for signs of preeclampsia during your prenatal visits. You may also have tests that include:  Checking your blood pressure.  Urine tests to check for protein. Your health care provider will check for this at every prenatal visit.  Blood tests.  Monitoring your baby's heart rate.  Ultrasound. How is this treated? You and your health care provider will determine the treatment approach that is best for you. Treatment may include:  Having more frequent prenatal exams to check for signs of preeclampsia, if you have an increased risk for preeclampsia.  Medicine to lower your blood pressure.  Staying in the hospital, if your condition is severe. There, treatment will focus on controlling your blood pressure and the amount of fluids in your body (fluid retention).  Taking medicine (magnesium sulfate) to prevent seizures. This may be given as an injection or through an IV.  Taking a low-dose aspirin during your pregnancy.  Delivering your baby early. You may have your labor started with medicine (induced), or you may have a cesarean delivery. Follow these instructions at home: Eating and drinking   Drink enough fluid to keep your urine pale yellow.  Avoid caffeine. Lifestyle  Do not use any products that contain nicotine or tobacco, such as cigarettes and e-cigarettes. If you need help quitting, ask your health care provider.  Do not use alcohol or drugs.  Avoid stress as much as possible. Rest and get   plenty of sleep. General instructions  Take over-the-counter and prescription medicines only as told by your health care provider.  When lying down, lie on your left side. This keeps pressure off your  major blood vessels.  When sitting or lying down, raise (elevate) your feet. Try putting some pillows underneath your lower legs.  Exercise regularly. Ask your health care provider what kinds of exercise are best for you.  Keep all follow-up and prenatal visits as told by your health care provider. This is important. How is this prevented? There is no known way of preventing preeclampsia or eclampsia from developing. However, to lower your risk of complications and detect problems early:  Get regular prenatal care. Your health care provider may be able to diagnose and treat the condition early.  Maintain a healthy weight. Ask your health care provider for help managing weight gain during pregnancy.  Work with your health care provider to manage any long-term (chronic) health conditions you have, such as diabetes or kidney problems.  You may have tests of your blood pressure and kidney function after giving birth.  Your health care provider may have you take low-dose aspirin during your next pregnancy. Contact a health care provider if:  You have symptoms that your health care provider told you may require more treatment or monitoring, such as: ? Headaches. ? Nausea or vomiting. ? Abdominal pain. ? Dizziness. ? Light-headedness. Get help right away if:  You have severe: ? Abdominal pain. ? Headaches that do not get better. ? Dizziness. ? Vision problems. ? Confusion. ? Nausea or vomiting.  You have any of the following: ? A seizure. ? Sudden, rapid weight gain. ? Sudden swelling in your hands, ankles, or face. ? Trouble moving any part of your body. ? Numbness in any part of your body. ? Trouble speaking. ? Abnormal bleeding.  You faint. Summary  Preeclampsia is a serious condition that may develop during pregnancy.  This condition causes high blood pressure and increased protein in your urine along with other symptoms, such as headaches and vision  changes.  Diagnosing and treating preeclampsia early is very important. If not treated early, it can cause serious problems for you and your baby.  Get help right away if you have symptoms that your health care provider told you to watch for. This information is not intended to replace advice given to you by your health care provider. Make sure you discuss any questions you have with your health care provider. Document Revised: 07/09/2018 Document Reviewed: 06/12/2016 Elsevier Patient Education  2020 Elsevier Inc.  

## 2020-03-10 NOTE — MAU Note (Signed)
F/u in office w/in next couple days.  Pre-E  S&S reviewed.

## 2020-03-10 NOTE — MAU Provider Note (Signed)
Chief Complaint  Patient presents with  . Headache  . Hypertension  . Dizziness     First Provider Initiated Contact with Patient 03/10/20 1103       S: Monica [name]  is a 44 y.o. y.o. year old G39P2002 female at [redacted]w[redacted]d weeks gestation who presents to MAU with elevated blood pressures 134/100, 140/100 at home, headache and new onset mild facial swelling since yesterday.  Rates headache 8/10.  Took extra strength Tylenol and tried to take a nap but woke up with headache just as strong.  Has not taken anything since 10 PM last night for pain.  Pos Hx HELLP previous pregnancy. Current blood pressure medication: None  Associated symptoms: pos for 8/10 headache, pos for vision changes, neg epigastric pain.  States she feels like her equilibrium is off. Contractions: denies Vaginal bleeding: denies Fetal movement: nml  Previous C-section x2.  Plans repeat.  O:  Patient Vitals for the past 24 hrs:  BP Temp Temp src Pulse Resp SpO2 Height Weight  03/10/20 1527 (!) 122/56 98.1 F (36.7 C) Oral 78 18 98 % -- --  03/10/20 1215 (!) 141/79 -- -- 91 -- -- -- --  03/10/20 1200 124/79 -- -- 91 -- -- -- --  03/10/20 1145 136/82 -- -- 91 -- -- -- --  03/10/20 1130 136/83 -- -- 92 -- -- -- --  03/10/20 1115 (!) 141/96 -- -- (!) 112 -- -- -- --  03/10/20 1059 (!) 142/89 -- -- 92 -- -- -- --  03/10/20 1021 (!) 146/94 98.4 F (36.9 C) Oral 64 18 97 % 5\' 8"  (1.727 m) (!) 137 kg   General: NAD Heart: Regular rate Lungs: Normal rate and effort Abd: Soft, NT, Gravid, S=D Extremities: 1+ pedal edema Neuro: 2+ deep tendon reflexes, No clonus Pelvic: NEFG, no bleeding or LOF.      EFM: 140, Moderate variability, 15 x 15 accelerations, no decelerations Toco: None  Results for orders placed or performed during the hospital encounter of 03/10/20 (from the past 24 hour(s))  Urinalysis, Routine w reflex microscopic     Status: Abnormal   Collection Time: 03/10/20 10:30 AM  Result Value Ref Range    Color, Urine YELLOW YELLOW   APPearance CLOUDY (A) CLEAR   Specific Gravity, Urine 1.021 1.005 - 1.030   pH 6.0 5.0 - 8.0   Glucose, UA NEGATIVE NEGATIVE mg/dL   Hgb urine dipstick NEGATIVE NEGATIVE   Bilirubin Urine NEGATIVE NEGATIVE   Ketones, ur NEGATIVE NEGATIVE mg/dL   Protein, ur 30 (A) NEGATIVE mg/dL   Nitrite NEGATIVE NEGATIVE   Leukocytes,Ua NEGATIVE NEGATIVE   WBC, UA 0-5 0 - 5 WBC/hpf   Bacteria, UA FEW (A) NONE SEEN   Squamous Epithelial / LPF 21-50 0 - 5   Mucus PRESENT    Ca Oxalate Crys, UA PRESENT   Protein / creatinine ratio, urine     Status: None   Collection Time: 03/10/20 10:30 AM  Result Value Ref Range   Creatinine, Urine 251.15 mg/dL   Total Protein, Urine 16 mg/dL   Protein Creatinine Ratio 0.06 0.00 - 0.15 mg/mg[Cre]  CBC     Status: Abnormal   Collection Time: 03/10/20 10:48 AM  Result Value Ref Range   WBC 9.1 4.0 - 10.5 K/uL   RBC 4.13 3.87 - 5.11 MIL/uL   Hemoglobin 11.6 (L) 12.0 - 15.0 g/dL   HCT 03/12/20 (L) 68.0 - 32.1 %   MCV 85.0 80.0 - 100.0 fL  MCH 28.1 26.0 - 34.0 pg   MCHC 33.0 30.0 - 36.0 g/dL   RDW 13.6 11.5 - 15.5 %   Platelets 310 150 - 400 K/uL   nRBC 0.0 0.0 - 0.2 %  Comprehensive metabolic panel     Status: Abnormal   Collection Time: 03/10/20 10:48 AM  Result Value Ref Range   Sodium 137 135 - 145 mmol/L   Potassium 3.7 3.5 - 5.1 mmol/L   Chloride 104 98 - 111 mmol/L   CO2 23 22 - 32 mmol/L   Glucose, Bld 89 70 - 99 mg/dL   BUN <5 (L) 6 - 20 mg/dL   Creatinine, Ser 0.62 0.44 - 1.00 mg/dL   Calcium 9.1 8.9 - 10.3 mg/dL   Total Protein 6.1 (L) 6.5 - 8.1 g/dL   Albumin 2.7 (L) 3.5 - 5.0 g/dL   AST 20 15 - 41 U/L   ALT 13 0 - 44 U/L   Alkaline Phosphatase 113 38 - 126 U/L   Total Bilirubin 0.7 0.3 - 1.2 mg/dL   GFR calc non Af Amer >60 >60 mL/min   GFR calc Af Amer >60 >60 mL/min   Anion gap 10 5 - 15   MAU course Headache decreased from 8/10-6/10 with Fioricet.  Call Dr. Kennon Rounds who is an OR.  Unavailable to speak.  Call  Dr. Corinna Capra since there is concern the patient may need to be admitted.  Recommends headache cocktail.  Headache resolved with headache cocktail.  Patient reports feeling better.  Discussed blood pressure, labs, resolution of symptoms with Dr. Kennon Rounds.  Dr. Kennon Rounds and Dr. Corinna Capra agree with plan of care for discharge home with blood pressure check in 2 days.  Strict preeclampsia precautions reviewed.  MDM -New-onset hypertension and non-intractable headache in pregnancy.  Does not meet criteria for preeclampsia but lengthy conversation with patient and partner that she is high-risk.  Emphasized importance of returning if symptoms worsen, headache does not respond to Ultram.  A: [redacted]w[redacted]d week IUP 1. Pregnancy headache in third trimester   2. Gestational hypertension, third trimester   3. History of HELLP syndrome, currently pregnant, third trimester   4. Acute nonintractable headache, unspecified headache type   FHR reactive  P: Discharge home in stable condition per consult with Dr. Kennon Rounds. Preeclampsia precautions. Follow-up for blood pressure check in 1-2 days at your doctor's office sooner as needed if symptoms worsen. Return to maternity admissions as needed in emergencies Allergies as of 03/10/2020   No Known Allergies     Medication List    STOP taking these medications   ibuprofen 600 MG tablet Commonly known as: ADVIL   oxyCODONE-acetaminophen 5-325 MG tablet Commonly known as: PERCOCET/ROXICET     TAKE these medications   acetaminophen 500 MG tablet Commonly known as: TYLENOL Take 500 mg by mouth every 6 (six) hours as needed for moderate pain.   aspirin EC 81 MG tablet Take 81 mg by mouth daily.   calcium carbonate 500 MG chewable tablet Commonly known as: TUMS - dosed in mg elemental calcium Chew 2 tablets by mouth daily as needed for indigestion or heartburn.   cetirizine 10 MG tablet Commonly known as: ZYRTEC Take 10 mg by mouth daily.   COLACE PO Take 1 capsule by  mouth daily.   famotidine 10 MG tablet Commonly known as: PEPCID Take 10 mg by mouth as needed for heartburn or indigestion.   Ferralet 90 90-1 MG Tabs Take 1 tablet by mouth daily.   fluticasone  50 MCG/ACT nasal spray Commonly known as: FLONASE Place 1 spray into both nostrils daily.   prenatal multivitamin Tabs tablet Take 1 tablet by mouth daily at 12 noon.   ProAir HFA 108 (90 Base) MCG/ACT inhaler Generic drug: albuterol Inhale 1-2 puffs into the lungs every 6 (six) hours as needed for wheezing or shortness of breath (exercise induced).   traMADol 50 MG tablet Commonly known as: Ultram Take 1-2 tablets (50-100 mg total) by mouth every 6 (six) hours as needed for severe pain.   valACYclovir 500 MG tablet Commonly known as: VALTREX Take 500 mg by mouth daily.        [place], [place], [place] 03/10/2020 4:44 PM

## 2020-03-10 NOTE — MAU Note (Signed)
Hasn't been feeling right the last 24 hrs.  Last night had a throbbing HA, took some Tylenol- woke up with the HA.  Checked BP 134/100, called the dr- they told there to come in.  Hx of HELLP with first preg, BP has been going up- today's was the highest.  +floaters past couple wks. Denies epigastric pain, no change in swelling.

## 2020-03-11 ENCOUNTER — Telehealth (HOSPITAL_COMMUNITY): Payer: Self-pay | Admitting: *Deleted

## 2020-03-11 NOTE — Telephone Encounter (Signed)
Preadmission screen  

## 2020-03-11 NOTE — Patient Instructions (Signed)
Maritsa Hunsucker  03/11/2020   Your procedure is scheduled on:  03/19/2020  Arrive at 1030 at Entrance C on [address] at [facility]  and [employer]. You are invited to use the FREE valet parking or use the Visitor's parking deck.  Pick up the phone at the desk and dial [phone].  Call this number if you have problems the morning of surgery: [phone]  Remember:   Do not eat food:(After Midnight) Desps de medianoche.  Do not drink clear liquids: (After Midnight) Desps de medianoche.  Take these medicines the morning of surgery with A SIP OF WATER:  Take valtrex as prescribed   Do not wear jewelry, make-up or nail polish.  Do not wear lotions, powders, or perfumes. Do not wear deodorant.  Do not shave 48 hours prior to surgery.  Do not bring valuables to the hospital.  [facility] is not   responsible for any belongings or valuables brought to the hospital.  Contacts, dentures or bridgework may not be worn into surgery.  Leave suitcase in the car. After surgery it may be brought to your room.  For patients admitted to the hospital, checkout time is 11:00 AM the day of              discharge.      Please read over the following fact sheets that you were given:     Preparing for Surgery

## 2020-03-14 ENCOUNTER — Inpatient Hospital Stay (HOSPITAL_COMMUNITY)
Admission: AD | Admit: 2020-03-14 | Discharge: 2020-03-14 | Disposition: A | Discharge status: Home / Self Care | Payer: Commercial Managed Care - PPO | Source: Home / Self Care | Attending: Obstetrics & Gynecology | Admitting: Obstetrics & Gynecology

## 2020-03-14 ENCOUNTER — Other Ambulatory Visit: Payer: Self-pay

## 2020-03-14 ENCOUNTER — Encounter (HOSPITAL_COMMUNITY): Payer: Self-pay | Admitting: Obstetrics & Gynecology

## 2020-03-14 DIAGNOSIS — R030 Elevated blood-pressure reading, without diagnosis of hypertension: Secondary | ICD-10-CM | POA: Insufficient documentation

## 2020-03-14 DIAGNOSIS — O26893 Other specified pregnancy related conditions, third trimester: Secondary | ICD-10-CM | POA: Insufficient documentation

## 2020-03-14 DIAGNOSIS — Z3A36 36 weeks gestation of pregnancy: Secondary | ICD-10-CM

## 2020-03-14 DIAGNOSIS — R519 Headache, unspecified: Secondary | ICD-10-CM | POA: Insufficient documentation

## 2020-03-14 LAB — URINALYSIS, ROUTINE W REFLEX MICROSCOPIC
Bilirubin Urine: NEGATIVE
Glucose, UA: NEGATIVE mg/dL
Hgb urine dipstick: NEGATIVE
Ketones, ur: NEGATIVE mg/dL
Leukocytes,Ua: NEGATIVE
Nitrite: NEGATIVE
Protein, ur: 30 mg/dL — AB
Specific Gravity, Urine: 1.021 (ref 1.005–1.030)
pH: 6 (ref 5.0–8.0)

## 2020-03-14 LAB — COMPREHENSIVE METABOLIC PANEL
ALT: 12 U/L (ref 0–44)
AST: 19 U/L (ref 15–41)
Albumin: 2.4 g/dL — ABNORMAL LOW (ref 3.5–5.0)
Alkaline Phosphatase: 102 U/L (ref 38–126)
Anion gap: 9 (ref 5–15)
BUN: 6 mg/dL (ref 6–20)
CO2: 22 mmol/L (ref 22–32)
Calcium: 8.5 mg/dL — ABNORMAL LOW (ref 8.9–10.3)
Chloride: 106 mmol/L (ref 98–111)
Creatinine, Ser: 0.68 mg/dL (ref 0.44–1.00)
GFR calc Af Amer: >60 mL/min (ref >60)
GFR calc non Af Amer: >60 mL/min (ref >60)
Glucose, Bld: 91 mg/dL (ref 70–99)
Potassium: 3.9 mmol/L (ref 3.5–5.1)
Sodium: 137 mmol/L (ref 135–145)
Total Bilirubin: 0.5 mg/dL (ref 0.3–1.2)
Total Protein: 5.2 g/dL — ABNORMAL LOW (ref 6.5–8.1)

## 2020-03-14 LAB — PROTEIN / CREATININE RATIO, URINE
Creatinine, Urine: 276.31 mg/dL
Protein Creatinine Ratio: 0.05 mg/mg{Cre} (ref 0.00–0.15)
Total Protein, Urine: 13 mg/dL

## 2020-03-14 LAB — CBC
HCT: 30.8 % — ABNORMAL LOW (ref 36.0–46.0)
Hemoglobin: 10.2 g/dL — ABNORMAL LOW (ref 12.0–15.0)
MCH: 27.9 pg (ref 26.0–34.0)
MCHC: 33.1 g/dL (ref 30.0–36.0)
MCV: 84.4 fL (ref 80.0–100.0)
Platelets: 236 10*3/uL (ref 150–400)
RBC: 3.65 MIL/uL — ABNORMAL LOW (ref 3.87–5.11)
RDW: 13.6 % (ref 11.5–15.5)
WBC: 8.7 10*3/uL (ref 4.0–10.5)
nRBC: 0 % (ref 0.0–0.2)

## 2020-03-14 MED ORDER — CYCLOBENZAPRINE HCL 5 MG PO TABS
10.0000 mg | ORAL_TABLET | Freq: Once | ORAL | Status: AC
  Administered 2020-03-14: 16:00:00 10 mg via ORAL
  Filled 2020-03-14: qty 2

## 2020-03-14 MED ORDER — ACETAMINOPHEN 500 MG PO TABS
1000.0000 mg | ORAL_TABLET | Freq: Once | ORAL | Status: AC
  Administered 2020-03-14: 1000 mg via ORAL
  Filled 2020-03-14: qty 2

## 2020-03-14 NOTE — MAU Note (Signed)
Monica [name] is a 44 y.o. at [redacted]w[redacted]d here in MAU reporting: was in the office Thursday and Friday for BP checks and NST. States Dr Langston Masker told her to come in if she feels poorly this weekend. Was put on BP meds on Thursday. Today at home BP was 160/95, states she has been having a headache and some visual changes. No RUQ pain. Hx of HELLP.  Onset of complaint: ongoing  Pain score: 8/10  Vitals:   03/14/20 1515  BP: 121/75  Pulse: 88  Resp: 16  Temp: 98.2 F (36.8 C)  SpO2: 98%     FHT: +FM  Lab orders placed from triage: UA

## 2020-03-14 NOTE — MAU Note (Signed)
Pt walked out of room with SO at 1902. Did not stay for discharge. Patient stated we should mail her discharge papers to her.

## 2020-03-14 NOTE — Progress Notes (Signed)
I came in to discuss plan with patient as when she was last in the office, I assured her with HA and elevated BPs >36 weeks, we would deliver.  I informed her that given BPs wnl, labs wnl, and HA gone, I cannot deliver her at this time.  The patient states she is afraid to go home because she's afraid it will happen again and she does not want to come back in the hospital without delivery.  I offered the patient obs with BMZ and serial BP monitoring.  She declines.  She left prior to d/c paper being given and has appt in the office tomorrow with Dr. Elon Spanner.    Mitchel Honour, DO

## 2020-03-14 NOTE — MAU Provider Note (Signed)
Chief Complaint  Patient presents with  . Headache  . Hypertension     First Provider Initiated Contact with Patient 03/14/20 1536      S: Monica [name]  is a 44 y.o. y.o. year old [id number] female at [redacted]w[redacted]d weeks gestation who presents to MAU with elevated blood pressures. 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 Probably undiagnosed chronic hypertension vs gestational hypertension. Started on labetalol 100 mg TID last Thursday. Reports elevated BPs at home today.   Associated symptoms: endorses Headache, endorses vision changes, denies epigastric pain Contractions: denies Vaginal bleeding: denies Fetal movement: good  O:  Patient Vitals for the past 24 hrs:  BP Temp Temp src Pulse Resp SpO2 Height Weight  03/14/20 1809 115/64 -- -- 100 -- -- -- --  03/14/20 1733 (!) 103/43 -- -- 98 -- -- -- --  03/14/20 1715 (!) 113/55 -- -- 85 -- -- -- --  03/14/20 1700 114/66 -- -- 91 -- -- -- --  03/14/20 1645 127/66 -- -- 91 -- -- -- --  03/14/20 1631 123/74 -- -- 90 -- -- -- --  03/14/20 1616 119/71 -- -- 90 -- -- -- --  03/14/20 1615 119/71 -- -- 90 -- -- -- --  03/14/20 1600 121/78 -- -- 82 -- -- -- --  03/14/20 1545 122/77 -- -- 91 -- -- -- --  03/14/20 1539 119/75 -- -- 89 -- -- -- --  03/14/20 1529 124/72 -- -- 97 -- -- -- --  03/14/20 1515 121/75 98.2 F (36.8 C) Oral 88 16 98 % -- --  03/14/20 1510 -- -- -- -- -- -- 5\' 8"  (1.727 m) (!) 139.2 kg   General: NAD Heart: Regular rate Lungs: Normal rate and effort Abd: Soft, NT, Gravid, S=D Extremities: 2+ pitting Pedal edema Neuro: 2+ deep tendon reflexes, No clonus  NST:  Baseline: 135 bpm, Variability: Good {> 6 bpm), Accelerations: Reactive and Decelerations: Absent  Results for orders placed or performed during the hospital encounter of 03/14/20 (from the past 24 hour(s))  Urinalysis, Routine w reflex microscopic     Status: Abnormal   Collection Time: 03/14/20  3:23 PM  Result Value Ref Range   Color, Urine YELLOW YELLOW    APPearance HAZY (A) CLEAR   Specific Gravity, Urine 1.021 1.005 - 1.030   pH 6.0 5.0 - 8.0   Glucose, UA NEGATIVE NEGATIVE mg/dL   Hgb urine dipstick NEGATIVE NEGATIVE   Bilirubin Urine NEGATIVE NEGATIVE   Ketones, ur NEGATIVE NEGATIVE mg/dL   Protein, ur 30 (A) NEGATIVE mg/dL   Nitrite NEGATIVE NEGATIVE   Leukocytes,Ua NEGATIVE NEGATIVE   RBC / HPF 0-5 0 - 5 RBC/hpf   WBC, UA 0-5 0 - 5 WBC/hpf   Bacteria, UA RARE (A) NONE SEEN   Squamous Epithelial / LPF 11-20 0 - 5   Mucus PRESENT    Non Squamous Epithelial 0-5 (A) NONE SEEN  Protein / creatinine ratio, urine     Status: None   Collection Time: 03/14/20  3:23 PM  Result Value Ref Range   Creatinine, Urine 276.31 mg/dL   Total Protein, Urine 13 mg/dL   Protein Creatinine Ratio 0.05 0.00 - 0.15 mg/mg[Cre]  CBC     Status: Abnormal   Collection Time: 03/14/20  3:59 PM  Result Value Ref Range   WBC 8.7 4.0 - 10.5 K/uL   RBC 3.65 (L) 3.87 - 5.11 MIL/uL   Hemoglobin 10.2 (L) 12.0 - 15.0 g/dL   HCT 30.8 (  L) 36.0 - 46.0 %   MCV 84.4 80.0 - 100.0 fL   MCH 27.9 26.0 - 34.0 pg   MCHC 33.1 30.0 - 36.0 g/dL   RDW 25.8 52.7 - 78.2 %   Platelets 236 150 - 400 K/uL   nRBC 0.0 0.0 - 0.2 %  Comprehensive metabolic panel     Status: Abnormal   Collection Time: 03/14/20  3:59 PM  Result Value Ref Range   Sodium 137 135 - 145 mmol/L   Potassium 3.9 3.5 - 5.1 mmol/L   Chloride 106 98 - 111 mmol/L   CO2 22 22 - 32 mmol/L   Glucose, Bld 91 70 - 99 mg/dL   BUN 6 6 - 20 mg/dL   Creatinine, Ser 4.23 0.44 - 1.00 mg/dL   Calcium 8.5 (L) 8.9 - 10.3 mg/dL   Total Protein 5.2 (L) 6.5 - 8.1 g/dL   Albumin 2.4 (L) 3.5 - 5.0 g/dL   AST 19 15 - 41 U/L   ALT 12 0 - 44 U/L   Alkaline Phosphatase 102 38 - 126 U/L   Total Bilirubin 0.5 0.3 - 1.2 mg/dL   GFR calc non Af Amer >60 >60 mL/min   GFR calc Af Amer >60 >60 mL/min   Anion gap 9 5 - 15    MDM Reactive NST  Pt normotensive while in MAU & normal PEC labs.  Headache somewhat improved  with tylenol & flexeril.  Patient requesting to speak directly with Dr. Langston Masker --- Dr. Langston Masker to come speak with patient  A:  1. Pregnancy headache in third trimester   2. [redacted] weeks gestation of pregnancy     P:  Dr. Langston Masker in to speak with patient regarding plan of care. Patient & spouse talking over their decision & will let Dr. Langston Masker know their plan   Judeth Horn, NP 03/14/2020 6:50 PM

## 2020-03-15 ENCOUNTER — Other Ambulatory Visit: Payer: Self-pay

## 2020-03-15 ENCOUNTER — Telehealth (HOSPITAL_COMMUNITY): Payer: Self-pay | Admitting: *Deleted

## 2020-03-15 ENCOUNTER — Inpatient Hospital Stay (HOSPITAL_COMMUNITY)
Admission: AD | Admit: 2020-03-15 | Discharge: 2020-03-17 | DRG: 785 | Disposition: A | Discharge status: Home / Self Care | Payer: Commercial Managed Care - PPO | Attending: Obstetrics and Gynecology | Admitting: Obstetrics and Gynecology

## 2020-03-15 ENCOUNTER — Other Ambulatory Visit (HOSPITAL_COMMUNITY)
Admission: RE | Admit: 2020-03-15 | Discharge: 2020-03-15 | Disposition: A | Discharge status: Home / Self Care | Payer: Commercial Managed Care - PPO | Source: Ambulatory Visit | Attending: Obstetrics and Gynecology | Admitting: Obstetrics and Gynecology

## 2020-03-15 ENCOUNTER — Encounter (HOSPITAL_COMMUNITY)
Admission: AD | Disposition: A | Discharge status: Home / Self Care | Payer: Self-pay | Source: Home / Self Care | Attending: Obstetrics and Gynecology

## 2020-03-15 ENCOUNTER — Inpatient Hospital Stay (HOSPITAL_COMMUNITY): Payer: Commercial Managed Care - PPO | Admitting: Certified Registered Nurse Anesthetist

## 2020-03-15 ENCOUNTER — Encounter (HOSPITAL_COMMUNITY): Payer: Self-pay | Admitting: Obstetrics and Gynecology

## 2020-03-15 DIAGNOSIS — O3663X0 Maternal care for excessive fetal growth, third trimester, not applicable or unspecified: Secondary | ICD-10-CM | POA: Diagnosis present

## 2020-03-15 DIAGNOSIS — Z20822 Contact with and (suspected) exposure to covid-19: Secondary | ICD-10-CM | POA: Diagnosis present

## 2020-03-15 DIAGNOSIS — Z302 Encounter for sterilization: Secondary | ICD-10-CM

## 2020-03-15 DIAGNOSIS — O1002 Pre-existing essential hypertension complicating childbirth: Secondary | ICD-10-CM | POA: Diagnosis present

## 2020-03-15 DIAGNOSIS — Z3A36 36 weeks gestation of pregnancy: Secondary | ICD-10-CM

## 2020-03-15 DIAGNOSIS — Z3493 Encounter for supervision of normal pregnancy, unspecified, third trimester: Secondary | ICD-10-CM

## 2020-03-15 DIAGNOSIS — D649 Anemia, unspecified: Secondary | ICD-10-CM | POA: Diagnosis present

## 2020-03-15 DIAGNOSIS — O34211 Maternal care for low transverse scar from previous cesarean delivery: Secondary | ICD-10-CM | POA: Diagnosis present

## 2020-03-15 DIAGNOSIS — O114 Pre-existing hypertension with pre-eclampsia, complicating childbirth: Secondary | ICD-10-CM | POA: Diagnosis present

## 2020-03-15 DIAGNOSIS — O4593 Premature separation of placenta, unspecified, third trimester: Secondary | ICD-10-CM | POA: Diagnosis present

## 2020-03-15 DIAGNOSIS — O99214 Obesity complicating childbirth: Secondary | ICD-10-CM | POA: Diagnosis present

## 2020-03-15 DIAGNOSIS — O9902 Anemia complicating childbirth: Secondary | ICD-10-CM | POA: Diagnosis present

## 2020-03-15 LAB — CBC
HCT: 31.2 % — ABNORMAL LOW (ref 36.0–46.0)
Hemoglobin: 10.1 g/dL — ABNORMAL LOW (ref 12.0–15.0)
MCH: 27.7 pg (ref 26.0–34.0)
MCHC: 32.4 g/dL (ref 30.0–36.0)
MCV: 85.5 fL (ref 80.0–100.0)
Platelets: 261 10*3/uL (ref 150–400)
RBC: 3.65 MIL/uL — ABNORMAL LOW (ref 3.87–5.11)
RDW: 13.7 % (ref 11.5–15.5)
WBC: 9.3 10*3/uL (ref 4.0–10.5)
nRBC: 0 % (ref 0.0–0.2)

## 2020-03-15 LAB — COMPREHENSIVE METABOLIC PANEL
ALT: 13 U/L (ref 0–44)
AST: 19 U/L (ref 15–41)
Albumin: 2.4 g/dL — ABNORMAL LOW (ref 3.5–5.0)
Alkaline Phosphatase: 105 U/L (ref 38–126)
Anion gap: 13 (ref 5–15)
BUN: 6 mg/dL (ref 6–20)
CO2: 20 mmol/L — ABNORMAL LOW (ref 22–32)
Calcium: 8.8 mg/dL — ABNORMAL LOW (ref 8.9–10.3)
Chloride: 104 mmol/L (ref 98–111)
Creatinine, Ser: 0.71 mg/dL (ref 0.44–1.00)
GFR calc Af Amer: >60 mL/min (ref >60)
GFR calc non Af Amer: >60 mL/min (ref >60)
Glucose, Bld: 81 mg/dL (ref 70–99)
Potassium: 3.8 mmol/L (ref 3.5–5.1)
Sodium: 137 mmol/L (ref 135–145)
Total Bilirubin: 0.3 mg/dL (ref 0.3–1.2)
Total Protein: 5.4 g/dL — ABNORMAL LOW (ref 6.5–8.1)

## 2020-03-15 LAB — ABO/RH: ABO/RH(D): O POS

## 2020-03-15 LAB — PROTEIN / CREATININE RATIO, URINE
Creatinine, Urine: 111.87 mg/dL
Protein Creatinine Ratio: 0.08 mg/mg{Cre} (ref 0.00–0.15)
Total Protein, Urine: 9 mg/dL

## 2020-03-15 LAB — TYPE AND SCREEN
ABO/RH(D): O POS
Antibody Screen: NEGATIVE

## 2020-03-15 LAB — RESPIRATORY PANEL BY RT PCR (FLU A&B, COVID)
Influenza A by PCR: NEGATIVE
Influenza B by PCR: NEGATIVE
SARS Coronavirus 2 by RT PCR: NEGATIVE

## 2020-03-15 SURGERY — Surgical Case
Anesthesia: Spinal

## 2020-03-15 MED ORDER — VALACYCLOVIR HCL 500 MG PO TABS
500.0000 mg | ORAL_TABLET | Freq: Every day | ORAL | Status: DC
  Administered 2020-03-16 – 2020-03-17 (×2): 500 mg via ORAL
  Filled 2020-03-15 (×2): qty 1

## 2020-03-15 MED ORDER — SODIUM CHLORIDE 0.9% FLUSH: 3.0000 mL | INTRAVENOUS | Status: DC | PRN

## 2020-03-15 MED ORDER — SOD CITRATE-CITRIC ACID 500-334 MG/5ML PO SOLN
30.0000 mL | ORAL | Status: AC
  Administered 2020-03-15: 30 mL via ORAL
  Filled 2020-03-15: qty 30

## 2020-03-15 MED ORDER — MAGNESIUM SULFATE BOLUS VIA INFUSION
4.0000 g | Freq: Once | INTRAVENOUS | Status: DC
  Filled 2020-03-15: qty 1000

## 2020-03-15 MED ORDER — SODIUM CHLORIDE 0.9 % IV SOLN: INTRAVENOUS | Status: DC | PRN

## 2020-03-15 MED ORDER — DIPHENHYDRAMINE HCL 25 MG PO CAPS: 25.0000 mg | ORAL_CAPSULE | Freq: Four times a day (QID) | ORAL | Status: DC | PRN

## 2020-03-15 MED ORDER — WITCH HAZEL-GLYCERIN EX PADS: 1.0000 "application " | MEDICATED_PAD | CUTANEOUS | Status: DC | PRN

## 2020-03-15 MED ORDER — IBUPROFEN 800 MG PO TABS
800.0000 mg | ORAL_TABLET | Freq: Four times a day (QID) | ORAL | Status: DC
  Administered 2020-03-17 (×2): 800 mg via ORAL
  Filled 2020-03-15 (×2): qty 1

## 2020-03-15 MED ORDER — NALBUPHINE HCL 10 MG/ML IJ SOLN: 5.0000 mg | INTRAMUSCULAR | Status: DC | PRN

## 2020-03-15 MED ORDER — NALOXONE HCL 0.4 MG/ML IJ SOLN: 0.4000 mg | INTRAMUSCULAR | Status: DC | PRN

## 2020-03-15 MED ORDER — MAGNESIUM SULFATE 40 GM/1000ML IV SOLN
2.0000 g/h | INTRAVENOUS | Status: AC
  Administered 2020-03-16 (×2): 2 g/h via INTRAVENOUS
  Filled 2020-03-15 (×2): qty 1000

## 2020-03-15 MED ORDER — PRENATAL MULTIVITAMIN CH
1.0000 | ORAL_TABLET | Freq: Every day | ORAL | Status: DC
  Administered 2020-03-16: 1 via ORAL
  Filled 2020-03-15: qty 1

## 2020-03-15 MED ORDER — FENTANYL CITRATE (PF) 100 MCG/2ML IJ SOLN
INTRAMUSCULAR | Status: DC | PRN
  Administered 2020-03-15: 15 ug via INTRATHECAL

## 2020-03-15 MED ORDER — MENTHOL 3 MG MT LOZG: 1.0000 | LOZENGE | OROMUCOSAL | Status: DC | PRN

## 2020-03-15 MED ORDER — ONDANSETRON HCL 4 MG/2ML IJ SOLN: 4.0000 mg | Freq: Three times a day (TID) | INTRAMUSCULAR | Status: DC | PRN

## 2020-03-15 MED ORDER — KETOROLAC TROMETHAMINE 30 MG/ML IJ SOLN
30.0000 mg | Freq: Once | INTRAMUSCULAR | Status: AC | PRN
  Administered 2020-03-15: 30 mg via INTRAVENOUS

## 2020-03-15 MED ORDER — PHENYLEPHRINE HCL-NACL 20-0.9 MG/250ML-% IV SOLN
INTRAVENOUS | Status: DC | PRN
  Administered 2020-03-15: 60 ug/min via INTRAVENOUS

## 2020-03-15 MED ORDER — NALOXONE HCL 4 MG/10ML IJ SOLN
1.0000 ug/kg/h | INTRAVENOUS | Status: DC | PRN
  Filled 2020-03-15: qty 5

## 2020-03-15 MED ORDER — COCONUT OIL OIL
1.0000 "application " | TOPICAL_OIL | Status: DC | PRN
  Administered 2020-03-16: 1 via TOPICAL

## 2020-03-15 MED ORDER — KETOROLAC TROMETHAMINE 30 MG/ML IJ SOLN: 30.0000 mg | Freq: Four times a day (QID) | INTRAMUSCULAR | Status: AC | PRN

## 2020-03-15 MED ORDER — LABETALOL HCL 5 MG/ML IV SOLN: 20.0000 mg | INTRAVENOUS | Status: DC | PRN

## 2020-03-15 MED ORDER — OXYTOCIN 40 UNITS IN NORMAL SALINE INFUSION - SIMPLE MED
INTRAVENOUS | Status: DC | PRN
  Administered 2020-03-15: 40 [IU] via INTRAVENOUS

## 2020-03-15 MED ORDER — ONDANSETRON HCL 4 MG/2ML IJ SOLN
INTRAMUSCULAR | Status: AC
  Filled 2020-03-15: qty 2

## 2020-03-15 MED ORDER — SCOPOLAMINE 1 MG/3DAYS TD PT72
MEDICATED_PATCH | TRANSDERMAL | Status: DC | PRN
  Administered 2020-03-15: 1 via TRANSDERMAL

## 2020-03-15 MED ORDER — SIMETHICONE 80 MG PO CHEW
80.0000 mg | CHEWABLE_TABLET | ORAL | Status: DC
  Administered 2020-03-16 – 2020-03-17 (×2): 80 mg via ORAL
  Filled 2020-03-15 (×2): qty 1

## 2020-03-15 MED ORDER — LACTATED RINGERS IV BOLUS
1000.0000 mL | Freq: Once | INTRAVENOUS | Status: AC
  Administered 2020-03-15: 1000 mL via INTRAVENOUS

## 2020-03-15 MED ORDER — ACETAMINOPHEN 500 MG PO TABS
1000.0000 mg | ORAL_TABLET | Freq: Four times a day (QID) | ORAL | Status: AC
  Filled 2020-03-15 (×3): qty 2

## 2020-03-15 MED ORDER — HYDROMORPHONE HCL 1 MG/ML IJ SOLN: 0.2000 mg | INTRAMUSCULAR | Status: DC | PRN

## 2020-03-15 MED ORDER — PROMETHAZINE HCL 25 MG/ML IJ SOLN: 6.2500 mg | INTRAMUSCULAR | Status: DC | PRN

## 2020-03-15 MED ORDER — ZOLPIDEM TARTRATE 5 MG PO TABS: 5.0000 mg | ORAL_TABLET | Freq: Every evening | ORAL | Status: DC | PRN

## 2020-03-15 MED ORDER — DIPHENHYDRAMINE HCL 50 MG/ML IJ SOLN
INTRAMUSCULAR | Status: AC
  Filled 2020-03-15: qty 1

## 2020-03-15 MED ORDER — OXYTOCIN 40 UNITS IN NORMAL SALINE INFUSION - SIMPLE MED: 2.5000 [IU]/h | INTRAVENOUS | Status: AC

## 2020-03-15 MED ORDER — SCOPOLAMINE 1 MG/3DAYS TD PT72: 1.0000 | MEDICATED_PATCH | Freq: Once | TRANSDERMAL | Status: DC

## 2020-03-15 MED ORDER — HYDRALAZINE HCL 10 MG PO TABS
10.0000 mg | ORAL_TABLET | Freq: Three times a day (TID) | ORAL | Status: DC | PRN
  Filled 2020-03-15: qty 1

## 2020-03-15 MED ORDER — PHENYLEPHRINE HCL-NACL 20-0.9 MG/250ML-% IV SOLN
INTRAVENOUS | Status: AC
  Filled 2020-03-15: qty 250

## 2020-03-15 MED ORDER — MORPHINE SULFATE (PF) 0.5 MG/ML IJ SOLN
INTRAMUSCULAR | Status: DC | PRN
  Administered 2020-03-15: .15 mg via INTRATHECAL

## 2020-03-15 MED ORDER — BETAMETHASONE SOD PHOS & ACET 6 (3-3) MG/ML IJ SUSP
12.0000 mg | Freq: Once | INTRAMUSCULAR | Status: AC
  Administered 2020-03-15: 12 mg via INTRAMUSCULAR
  Filled 2020-03-15: qty 5

## 2020-03-15 MED ORDER — DIPHENHYDRAMINE HCL 25 MG PO CAPS: 25.0000 mg | ORAL_CAPSULE | ORAL | Status: DC | PRN

## 2020-03-15 MED ORDER — TETANUS-DIPHTH-ACELL PERTUSSIS 5-2.5-18.5 LF-MCG/0.5 IM SUSP: 0.5000 mL | Freq: Once | INTRAMUSCULAR | Status: DC

## 2020-03-15 MED ORDER — ACETAMINOPHEN 500 MG PO TABS
1000.0000 mg | ORAL_TABLET | Freq: Four times a day (QID) | ORAL | Status: DC
  Administered 2020-03-16 – 2020-03-17 (×6): 1000 mg via ORAL
  Filled 2020-03-15 (×5): qty 2

## 2020-03-15 MED ORDER — KETOROLAC TROMETHAMINE 30 MG/ML IJ SOLN
30.0000 mg | Freq: Four times a day (QID) | INTRAMUSCULAR | Status: AC
  Administered 2020-03-16 (×3): 30 mg via INTRAVENOUS
  Filled 2020-03-15 (×3): qty 1

## 2020-03-15 MED ORDER — OXYTOCIN 40 UNITS IN NORMAL SALINE INFUSION - SIMPLE MED
INTRAVENOUS | Status: AC
  Filled 2020-03-15: qty 1000

## 2020-03-15 MED ORDER — FENTANYL CITRATE (PF) 100 MCG/2ML IJ SOLN
INTRAMUSCULAR | Status: AC
  Filled 2020-03-15: qty 2

## 2020-03-15 MED ORDER — ONDANSETRON HCL 4 MG/2ML IJ SOLN
INTRAMUSCULAR | Status: DC | PRN
  Administered 2020-03-15: 4 mg via INTRAVENOUS

## 2020-03-15 MED ORDER — MEPERIDINE HCL 25 MG/ML IJ SOLN: 6.2500 mg | INTRAMUSCULAR | Status: DC | PRN

## 2020-03-15 MED ORDER — LABETALOL HCL 5 MG/ML IV SOLN: 80.0000 mg | INTRAVENOUS | Status: DC | PRN

## 2020-03-15 MED ORDER — LABETALOL HCL 100 MG PO TABS
100.0000 mg | ORAL_TABLET | Freq: Three times a day (TID) | ORAL | Status: DC
  Administered 2020-03-16 – 2020-03-17 (×5): 100 mg via ORAL
  Filled 2020-03-15 (×5): qty 1

## 2020-03-15 MED ORDER — LACTATED RINGERS IV SOLN: INTRAVENOUS | Status: DC | PRN

## 2020-03-15 MED ORDER — BUPIVACAINE IN DEXTROSE 0.75-8.25 % IT SOLN
INTRATHECAL | Status: DC | PRN
  Administered 2020-03-15: 1.6 mL via INTRATHECAL

## 2020-03-15 MED ORDER — MAGNESIUM SULFATE BOLUS VIA INFUSION
4.0000 g | Freq: Once | INTRAVENOUS | Status: AC
  Administered 2020-03-16: 4 g via INTRAVENOUS
  Filled 2020-03-15: qty 1000

## 2020-03-15 MED ORDER — SIMETHICONE 80 MG PO CHEW
80.0000 mg | CHEWABLE_TABLET | Freq: Three times a day (TID) | ORAL | Status: DC
  Administered 2020-03-16 – 2020-03-17 (×4): 80 mg via ORAL
  Filled 2020-03-15 (×4): qty 1

## 2020-03-15 MED ORDER — LABETALOL HCL 5 MG/ML IV SOLN: 40.0000 mg | INTRAVENOUS | Status: DC | PRN

## 2020-03-15 MED ORDER — DIBUCAINE (PERIANAL) 1 % EX OINT
1.0000 "application " | TOPICAL_OINTMENT | CUTANEOUS | Status: DC | PRN
  Administered 2020-03-16: 1 via RECTAL
  Filled 2020-03-15: qty 28

## 2020-03-15 MED ORDER — MORPHINE SULFATE (PF) 0.5 MG/ML IJ SOLN
INTRAMUSCULAR | Status: AC
  Filled 2020-03-15: qty 10

## 2020-03-15 MED ORDER — DEXTROSE 5 % IV SOLN
3.0000 g | INTRAVENOUS | Status: AC
  Administered 2020-03-15: 3 g via INTRAVENOUS
  Filled 2020-03-15: qty 3000

## 2020-03-15 MED ORDER — HYDROMORPHONE HCL 1 MG/ML IJ SOLN: 0.2500 mg | INTRAMUSCULAR | Status: DC | PRN

## 2020-03-15 MED ORDER — NALBUPHINE HCL 10 MG/ML IJ SOLN: 5.0000 mg | Freq: Once | INTRAMUSCULAR | Status: DC | PRN

## 2020-03-15 MED ORDER — DIPHENHYDRAMINE HCL 50 MG/ML IJ SOLN
12.5000 mg | INTRAMUSCULAR | Status: DC | PRN
  Administered 2020-03-15: 12.5 mg via INTRAVENOUS

## 2020-03-15 MED ORDER — SENNOSIDES-DOCUSATE SODIUM 8.6-50 MG PO TABS
2.0000 | ORAL_TABLET | ORAL | Status: DC
  Administered 2020-03-16 – 2020-03-17 (×2): 2 via ORAL
  Filled 2020-03-15 (×2): qty 2

## 2020-03-15 MED ORDER — DEXTROSE 5 % IV SOLN
INTRAVENOUS | Status: AC
  Filled 2020-03-15: qty 3000

## 2020-03-15 MED ORDER — KETOROLAC TROMETHAMINE 30 MG/ML IJ SOLN
INTRAMUSCULAR | Status: AC
  Filled 2020-03-15: qty 1

## 2020-03-15 MED ORDER — DEXAMETHASONE SODIUM PHOSPHATE 10 MG/ML IJ SOLN
INTRAMUSCULAR | Status: AC
  Filled 2020-03-15: qty 1

## 2020-03-15 MED ORDER — SCOPOLAMINE 1 MG/3DAYS TD PT72
MEDICATED_PATCH | TRANSDERMAL | Status: AC
  Filled 2020-03-15: qty 1

## 2020-03-15 MED ORDER — FAMOTIDINE 20 MG PO TABS: 10.0000 mg | ORAL_TABLET | Freq: Two times a day (BID) | ORAL | Status: DC | PRN

## 2020-03-15 MED ORDER — DEXAMETHASONE SODIUM PHOSPHATE 10 MG/ML IJ SOLN
INTRAMUSCULAR | Status: DC | PRN
  Administered 2020-03-15: 10 mg via INTRAVENOUS

## 2020-03-15 MED ORDER — OXYCODONE HCL 5 MG PO TABS
5.0000 mg | ORAL_TABLET | ORAL | Status: DC | PRN
  Administered 2020-03-16 – 2020-03-17 (×3): 5 mg via ORAL
  Filled 2020-03-15 (×3): qty 1

## 2020-03-15 MED ORDER — SIMETHICONE 80 MG PO CHEW: 80.0000 mg | CHEWABLE_TABLET | ORAL | Status: DC | PRN

## 2020-03-15 MED ORDER — FAMOTIDINE IN NACL 20-0.9 MG/50ML-% IV SOLN
20.0000 mg | Freq: Once | INTRAVENOUS | Status: AC
  Administered 2020-03-15: 20 mg via INTRAVENOUS
  Filled 2020-03-15: qty 50

## 2020-03-15 MED ORDER — LACTATED RINGERS IV SOLN: INTRAVENOUS | Status: DC

## 2020-03-15 SURGICAL SUPPLY — 35 items
BENZOIN TINCTURE PRP APPL 2/3 (GAUZE/BANDAGES/DRESSINGS) ×3 IMPLANT
CHLORAPREP W/TINT 26ML (MISCELLANEOUS) ×3 IMPLANT
CLAMP CORD UMBIL (MISCELLANEOUS) IMPLANT
CLOSURE STERI STRIP 1/2 X4 (GAUZE/BANDAGES/DRESSINGS) ×3 IMPLANT
CLOTH BEACON ORANGE TIMEOUT ST (SAFETY) ×3 IMPLANT
DERMABOND ADVANCED (GAUZE/BANDAGES/DRESSINGS) ×2
DERMABOND ADVANCED .7 DNX12 (GAUZE/BANDAGES/DRESSINGS) ×1 IMPLANT
DRSG OPSITE POSTOP 4X10 (GAUZE/BANDAGES/DRESSINGS) ×3 IMPLANT
ELECT REM PT RETURN 9FT ADLT (ELECTROSURGICAL) ×3
ELECTRODE REM PT RTRN 9FT ADLT (ELECTROSURGICAL) ×1 IMPLANT
EXTRACTOR VACUUM KIWI (MISCELLANEOUS) IMPLANT
GLOVE BIO SURGEON STRL SZ 6.5 (GLOVE) ×2 IMPLANT
GLOVE BIO SURGEONS STRL SZ 6.5 (GLOVE) ×1
GLOVE BIOGEL PI IND STRL 6.5 (GLOVE) ×1 IMPLANT
GLOVE BIOGEL PI IND STRL 7.0 (GLOVE) ×2 IMPLANT
GLOVE BIOGEL PI INDICATOR 6.5 (GLOVE) ×2
GLOVE BIOGEL PI INDICATOR 7.0 (GLOVE) ×4
GOWN STRL REUS W/TWL LRG LVL3 (GOWN DISPOSABLE) ×6 IMPLANT
HOVERMATT SINGLE USE (MISCELLANEOUS) ×3 IMPLANT
KIT ABG SYR 3ML LUER SLIP (SYRINGE) ×3 IMPLANT
NEEDLE HYPO 25X5/8 SAFETYGLIDE (NEEDLE) ×3 IMPLANT
NS IRRIG 1000ML POUR BTL (IV SOLUTION) ×3 IMPLANT
PACK C SECTION WH (CUSTOM PROCEDURE TRAY) ×3 IMPLANT
PAD OB MATERNITY 4.3X12.25 (PERSONAL CARE ITEMS) ×3 IMPLANT
PENCIL SMOKE EVAC W/HOLSTER (ELECTROSURGICAL) ×3 IMPLANT
SUT PLAIN 0 NONE (SUTURE) IMPLANT
SUT PLAIN 2 0 (SUTURE) ×2
SUT PLAIN ABS 2-0 CT1 27XMFL (SUTURE) ×1 IMPLANT
SUT VIC AB 0 CT1 36 (SUTURE) ×3 IMPLANT
SUT VIC AB 0 CTX 36 (SUTURE) ×4
SUT VIC AB 0 CTX36XBRD ANBCTRL (SUTURE) ×2 IMPLANT
SUT VIC AB 4-0 PS2 27 (SUTURE) ×3 IMPLANT
TOWEL OR 17X24 6PK STRL BLUE (TOWEL DISPOSABLE) ×3 IMPLANT
TRAY FOLEY W/BAG SLVR 14FR LF (SET/KITS/TRAYS/PACK) IMPLANT
WATER STERILE IRR 1000ML POUR (IV SOLUTION) ×3 IMPLANT

## 2020-03-15 NOTE — Transfer of Care (Signed)
Immediate Anesthesia Transfer of Care Note  Patient: Monica [name]  Procedure(s) Performed: CESAREAN SECTION WITH BILATERAL TUBAL LIGATION (N/A )  Patient Location: PACU  Anesthesia Type:Spinal  Level of Consciousness: awake, alert  and oriented  Airway & Oxygen Therapy: Patient Spontanous Breathing  Post-op Assessment: Report given to RN and Post -op Vital signs reviewed and stable  Post vital signs: Reviewed and stable  Last Vitals:  Vitals Value Taken Time  BP 122/89 03/15/20 2215  Temp    Pulse 90 03/15/20 2218  Resp 17 03/15/20 2218  SpO2 97 % 03/15/20 2218  Vitals shown include unvalidated device data.  Last Pain:  Vitals:   03/15/20 1829  PainSc: 8          Complications: No apparent anesthesia complications

## 2020-03-15 NOTE — MAU Note (Signed)
Pt states she has had issues the whole past week with her blood pressure. She has been in MAU a couple of times. Today she talked with her Dr. Who instructed her to come. Pt states she has a HA that was unrelieved by tramadol or flexeril. States she has had black spots and bright spots in her vision states "I could not see my mother-in-law, it looked like a reflection where her face would be". States she has had increased swelling in her legs, hands, and face. Denies RUQ pain. Denies VB. States she felt something leak on the floor but states "it was probably pee". Reports good fetal movement.

## 2020-03-15 NOTE — H&P (Signed)
Monica [name] is a 44 y.o. female presenting for SIPE with severe features. Pregnancy c/b suspected cHTN not requiring medications until ~33 wga when she was started on Labetalol. Now has a HA unrelieved with multiple medications. Has also gained 4 pounds in 48 hours and has floaters. Has a hx of HELLP syndrome with prior pregancy and this feels similar. Had normal labs in the office today but given severe features by HA/vision criteria, delivery was recommended by her primary doctor. She also has a hx of PPD x2, 2 CS, HSV, and asthma. She desires a rCS with BTL. This baby is known LGA. She is AMA and panorama was RR.   OB History    Gravida  3   Para  2   Term  2   Preterm      AB      Living  2     SAB      TAB      Ectopic      Multiple  0   Live Births  2          Past Medical History:  Diagnosis Date  . AMA (advanced maternal age) multigravida 68+   . Anxiety   . Asthma   . Depression    had PP depression  . HSV (herpes simplex virus) anogenital infection   . Hx of HELLP syndrome, currently pregnant   . Vaginal Pap smear, abnormal    Past Surgical History:  Procedure Laterality Date  . ADENOIDECTOMY    . bone spur removal    . CESAREAN SECTION  2013  . CESAREAN SECTION N/A 10/20/2017   Procedure: REPEAT CESAREAN SECTION WITH SUPRA CLITORAL SKIN TAG REMOVAL;  Surgeon: Marylynn Pearson, MD;  Location: [place];  Service: Obstetrics;  Laterality: N/A;  Repeat edc 11/05/17 nkda   . COLPOSCOPY    . tonsil suregery     additional tissue removal  . TONSILLECTOMY     Family History: family history includes Hashimoto's thyroiditis in her mother; Hypertension in her father and mother; Skin cancer in her paternal grandfather; Stroke in her paternal grandfather. Social History:  reports that she has never smoked. She has never used smokeless tobacco. She reports that she does not drink alcohol or use drugs.     Maternal Diabetes: No Genetic Screening:  Normal Maternal Ultrasounds/Referrals: Other: LGA Fetal Ultrasounds or other Referrals:  None Maternal Substance Abuse:  No Significant Maternal Medications:  None Significant Maternal Lab Results:  None Other Comments:  None  Review of Systems History   Height 5\' 8"  (1.727 m), weight (!) 140.3 kg, unknown if currently breastfeeding. Exam Physical Exam  (from office) NAD, A&O NWOB Abd soft, nondistended, gravid  Prenatal labs: ABO, Rh: O/Positive/-- (10/20 0000) Antibody: Negative (10/20 0000) Rubella: Immune (10/20 0000) RPR: Nonreactive (10/20 0000)  HBsAg: Negative (10/20 0000)  HIV: Non-reactive (10/20 0000)  GBS:     Assessment/Plan: 44 yo presenting with presumed SIPE by HA/vision criteria at 36.3 wga. Will give one dose of BMZ and proceed with repeat CS with requested bilateral tubal ligation. Risks discussed including infection, bleeding, damage to surrounding structures, the need for additional procedures including hysterectomy, and the possibility of uterine rupture with neonatal morbidity/mortality, scarring, and abnormal placentation with subsequent pregnancies. We also discussed bilateral tubal ligation in detail. The alternatives to permanent sterilization were reviewed and it was discussed that this is considered permanent. We reviewed the risks in detail including regret, failure and ectopic pregnancy. She understands  and agrees to proceed.  Ancef on call to OR. IV Magnesium x 24 hours pp.     Monica [name] Monica [name] 03/15/2020, 6:20 PM

## 2020-03-15 NOTE — Telephone Encounter (Signed)
Preadmission screen  

## 2020-03-15 NOTE — Anesthesia Procedure Notes (Signed)
Spinal  Patient location during procedure: OR Start time: 03/15/2020 8:37 PM End time: 03/15/2020 8:40 PM Staffing Performed: anesthesiologist  Anesthesiologist: Leilani Able, MD Preanesthetic Checklist Completed: patient identified, IV checked, site marked, risks and benefits discussed, surgical consent, monitors and equipment checked, pre-op evaluation and timeout performed Spinal Block Patient position: sitting Prep: DuraPrep and site prepped and draped Patient monitoring: continuous pulse ox and blood pressure Approach: midline Location: L3-4 Injection technique: single-shot Needle Needle type: Pencan  Needle gauge: 24 G Needle length: 10 cm Needle insertion depth: 6 cm Assessment Sensory level: T4

## 2020-03-15 NOTE — Telephone Encounter (Signed)
Pt states she is feeling worse and erquest to do covid and blood tests on day of surgery.  Notified Dr Henderson Cloud of how pt is feeling and pt instructed to get someone to drive her to MAU for evaluation.  Information given to patient and verbalized understanding.    In case she is allowed to return home, I discussed her preop instructions and arrival time with her with verbalized understanding.

## 2020-03-15 NOTE — Op Note (Signed)
PROCEDURE DATE: 03/15/20   PREOPERATIVE DIAGNOSIS: Intrauterine pregnancy at  36.3 wga, Indication: repeat cesarean section in setting of SIPE with severe features and desires permanent sterility.    POSTOPERATIVE DIAGNOSIS: The same   PROCEDURE:    Repeat Low Transverse Cesarean Section with BTL   SURGEON:  Dr. Belva Agee   INDICATIONS: This is a 46WN [id number] at 19.3 wga requiring cesarean section secondary to SIPE w/severe features and desires permanent sterility. Has had increasing Bps in the last few weeks and was started on Labetalol 100mg  TID. Had HA unrelieved with multiple pain meds, persistent blurry vision, worsening symmetric edema, and elevated BP. BMZ was given and patient counseled infant may require NICU stay.  Decision made to proceed with LTCS. The risks of cesarean section discussed with the patient included but were not limited to: bleeding which may require transfusion or reoperation; infection which may require antibiotics; injury to bowel, bladder, ureters or other surrounding organs; injury to the fetus; need for additional procedures including hysterectomy in the event of a life-threatening hemorrhage; placental abnormalities wth subsequent pregnancies, incisional problems, thromboembolic phenomenon and other postoperative/anesthesia complications. We also discussed bilateral tubal ligation in detail. The alternatives to permanent sterilization were reviewed and it was discussed that this is considered permanent. We reviewed the risks in detail including regret, failure and ectopic pregnancy.   The patient agreed with the proposed plan, giving informed consent for the procedure.     FINDINGS:  Viable female infant in vertex presentation, APGARs pending,  Weight pending, Amniotic fluid clear, Placenta with suspected abruption, three vessel cord.  Dense adhesive disease extending to mid uterus but otherwise Grossly normal uterus, ovaries and fallopian tubes. .   ANESTHESIA:     Epidural ESTIMATED BLOOD LOSS: 660cc SPECIMENS: Placenta for L&D COMPLICATIONS: None immediate   PROCEDURE IN DETAIL:     The patient received intravenous antibiotics (3g Ancef) and had sequential compression devices applied to her lower extremities while in the preoperative area.  She was then taken to the operating room where epidural anesthesia was dosed up to surgical level and was found to be adequate. She was then placed in a dorsal supine position with a leftward tilt, and prepped and draped in a sterile manner.  A foley catheter was placed into her bladder and attached to constant gravity.  After an adequate timeout was performed, a Pfannenstiel skin incision was made with scalpel and carried through to the underlying layer of fascia. Old skin scar was removed and discarded. The fascia was incised in the midline and this incision was extended bilaterally using the Mayo scissors. Dense scarring noted. Kocher clamps were applied to the superior aspect of the fascial incision and the underlying rectus muscles were dissected off bluntly. Scar again was noted. The rectus muscles were separated in the midline bluntly and the peritoneum was entered sharply. Dense adhesive disease noted with old periteal scarring extending over bilateral rectus muscles. This was taken down sharply with care to avoid injuring surrounding structures. A transverse hysterotomy was made with a scalpel and extended bilaterally bluntly. The bladder blade was then removed. The infant was successfully delivered with vacuum assistance of the head, and cord was clamped and cut and infant was handed over to awaiting neonatology team. Uterine massage was then administered and the placenta delivered intact with three-vessel cord. The placenta was noted to be somewhat adherent and with a possible abruption. Once separated, all appeared to be removed. Cord gases were taken and cord gas was  7.2. The uterus was cleared of clot and debris. The  hysterotomy was closed with 0 vicryl.  A second imbricating suture of 0-vicryl was used to reinforce the incision and aid in hemostasis. A bilateral tubal ligation was performed via modified pomeroy in the usual fashion.  Good hemostasis was noted before and after uterus and fallopian tubes placed back into abdomen. The fascia was closed with 0-Vicryl in a running fashion with good restoration of anatomy.  The subcutaneus tissue was irrigated and was reapproximated using three interrupted plain gut stitches.  The skin was closed with 4-0 Vicryl in a subcuticular fashion.   All surgical site and was hemostatic at end of procedure) without any further bleeding on exam.   It's a girl - "Ruby Mabel"!!    Pt tolerated the procedure well. All sponge/lap/needle counts were correct  X 2. Pt taken to recovery room in stable condition.     Lucillie Garfinkel MD

## 2020-03-15 NOTE — Anesthesia Preprocedure Evaluation (Addendum)
Anesthesia Evaluation  Patient identified by MRN, date of birth, ID band Patient awake    Reviewed: Allergy & Precautions, H&P , NPO status , Patient's Chart, lab work & pertinent test results, reviewed documented beta blocker date and time   Airway Mallampati: III       Dental no notable dental hx. (+) Teeth Intact   Pulmonary neg pulmonary ROS,    Pulmonary exam normal breath sounds clear to auscultation       Cardiovascular hypertension, Normal cardiovascular exam Rhythm:Regular Rate:Normal     Neuro/Psych PSYCHIATRIC DISORDERS Anxiety Depression negative neurological ROS     GI/Hepatic negative GI ROS, Neg liver ROS,   Endo/Other  Morbid obesity  Renal/GU negative Renal ROS     Musculoskeletal   Abdominal (+) + obese,   Peds  Hematology  (+) Blood dyscrasia, anemia ,   Anesthesia Other Findings   Reproductive/Obstetrics (+) Pregnancy                           Anesthesia Physical Anesthesia Plan  ASA: III  Anesthesia Plan: Spinal   Post-op Pain Management:    Induction:   PONV Risk Score and Plan: 3 and Ondansetron, Dexamethasone and Scopolamine patch - Pre-op  Airway Management Planned: Natural Airway and Nasal Cannula  Additional Equipment: None  Intra-op Plan:   Post-operative Plan:   Informed Consent:   Plan Discussed with:   Anesthesia Plan Comments:         Anesthesia Quick Evaluation

## 2020-03-15 NOTE — Patient Instructions (Addendum)
Monica [name]  03/15/2020   Your procedure is scheduled on:  03/16/2020  Arrive at 1200 at Entrance C on [address] at [facility]  and [employer]. You are invited to use the FREE valet parking or use the Visitor's parking deck.  Pick up the phone at the desk and dial [phone].  Call this number if you have problems the morning of surgery: [phone]  Remember:   Do not eat food:(After Midnight) Desps de medianoche.  Do not drink clear liquids: (After Midnight) Desps de medianoche.  Take these medicines the morning of surgery with A SIP OF WATER:  Take your labetalol as prescribed.   Do not wear jewelry, make-up or nail polish.  Do not wear lotions, powders, or perfumes. Do not wear deodorant.  Do not shave 48 hours prior to surgery.  Do not bring valuables to the hospital.  [facility] is not   responsible for any belongings or valuables brought to the hospital.  Contacts, dentures or bridgework may not be worn into surgery.  Leave suitcase in the car. After surgery it may be brought to your room.  For patients admitted to the hospital, checkout time is 11:00 AM the day of              discharge.      Please read over the following fact sheets that you were given:     Preparing for Surgery

## 2020-03-16 ENCOUNTER — Inpatient Hospital Stay (HOSPITAL_COMMUNITY)
Admission: RE | Admit: 2020-03-16 | Payer: Commercial Managed Care - PPO | Source: Home / Self Care | Admitting: Obstetrics and Gynecology

## 2020-03-16 ENCOUNTER — Encounter (HOSPITAL_COMMUNITY): Payer: Self-pay | Admitting: Obstetrics and Gynecology

## 2020-03-16 ENCOUNTER — Encounter (HOSPITAL_COMMUNITY): Admission: RE | Payer: Self-pay | Source: Home / Self Care

## 2020-03-16 LAB — CBC
HCT: 31.6 % — ABNORMAL LOW (ref 36.0–46.0)
Hemoglobin: 10.3 g/dL — ABNORMAL LOW (ref 12.0–15.0)
MCH: 27.8 pg (ref 26.0–34.0)
MCHC: 32.6 g/dL (ref 30.0–36.0)
MCV: 85.4 fL (ref 80.0–100.0)
Platelets: 275 10*3/uL (ref 150–400)
RBC: 3.7 MIL/uL — ABNORMAL LOW (ref 3.87–5.11)
RDW: 13.4 % (ref 11.5–15.5)
WBC: 20.2 10*3/uL — ABNORMAL HIGH (ref 4.0–10.5)
nRBC: 0 % (ref 0.0–0.2)

## 2020-03-16 LAB — RPR: RPR Ser Ql: NONREACTIVE

## 2020-03-16 SURGERY — Surgical Case
Anesthesia: Regional | Laterality: Bilateral

## 2020-03-16 NOTE — Progress Notes (Signed)
Subjective: Postpartum Day 1: Cesarean Delivery Patient reports incisional pain, tolerating PO and + flatus.    Objective: Vital signs in last 24 hours: Temp:  [97.6 F (36.4 C)-98.2 F (36.8 C)] 97.7 F (36.5 C) (04/27 0818) Pulse Rate:  [82-99] 89 (04/27 0818) Resp:  [0-38] 18 (04/27 0818) BP: (88-144)/(64-89) 125/76 (04/27 0818) SpO2:  [86 %-99 %] 95 % (04/27 0818) Weight:  [140.3 kg] 140.3 kg (04/26 1805)  Physical Exam:  General: alert, cooperative, appears stated age and no distress Lochia: appropriate Uterine Fundus: firm Incision: healing well DVT Evaluation: No evidence of DVT seen on physical exam.  Recent Labs    03/15/20 1818 03/16/20 0546  HGB 10.1* 10.3*  HCT 31.2* 31.6*    Assessment/Plan: Status post Cesarean section. Postoperative course complicated by SIPE.  cont Magnesium x 24 hours post delivery.  BPs stable and labs normal.  No severe features at this time Her Daughter is in the NICU but doing well  Turner Daniels 03/16/2020, 9:29 AM

## 2020-03-16 NOTE — Plan of Care (Signed)
  Problem: Education: Goal: Knowledge of disease or condition will improve Outcome: Progressing   

## 2020-03-16 NOTE — Plan of Care (Signed)
  Problem: Education: Goal: Knowledge of General Education information will improve Description: Including pain rating scale, medication(s)/side effects and non-pharmacologic comfort measures Outcome: Completed/Met

## 2020-03-16 NOTE — Anesthesia Postprocedure Evaluation (Signed)
Anesthesia Post Note  Patient: Monica [name]  Procedure(s) Performed: CESAREAN SECTION WITH BILATERAL TUBAL LIGATION (N/A )     Patient location during evaluation: PACU Anesthesia Type: Spinal Level of consciousness: awake Pain management: pain level controlled Vital Signs Assessment: post-procedure vital signs reviewed and stable Respiratory status: spontaneous breathing Cardiovascular status: stable Postop Assessment: no headache, no backache, spinal receding, patient able to bend at knees and no apparent nausea or vomiting Anesthetic complications: no    Last Vitals:  Vitals:   03/16/20 0001 03/16/20 0018  BP: 137/83 134/73  Pulse: 88 89  Resp: 18 19  Temp:    SpO2: 96% 96%    Last Pain:  Vitals:   03/16/20 0021  TempSrc:   PainSc: 0-No pain   Pain Goal: Patients Stated Pain Goal: 5 (03/16/20 0021)              Epidural/Spinal Function Cutaneous sensation: Normal sensation (03/16/20 0021), Patient able to flex knees: Yes (03/16/20 0021), Patient able to lift hips off bed: Yes (03/16/20 0021), Back pain beyond tenderness at insertion site: No (03/16/20 0021), Progressively worsening motor and/or sensory loss: No (03/16/20 0021), Bowel and/or bladder incontinence post epidural: No (03/16/20 0021)  Caren Macadam

## 2020-03-16 NOTE — Lactation Note (Signed)
This note was copied from a baby's chart. Lactation Consultation Note  Patient Name: Monica [name] [id number] Date: 03/16/2020 Reason for consult: Late-preterm 34-36.6wks;NICU baby;Initial assessment P3, 3 hour LPTI in NICU. Mom's hx: CHTN, Pre-eclampsia, AMA, depression and HSV. Mom is experienced at breastfeeding she breastfed 1st child for 13 months and 2nd child who is 43 years old for 15 months. Mom has Medela DEBP at home. Mom will follow NICU infant feeding policy and procedures.  Mom knows how to hand express and expressed a few drops of colostrum in bullet that dad took to NICU for infant with labels. Mom will use DEBP every 3 hours for 15 minutes on initial setting and will hand express afterwards to help establish milk supply due to infant separation ( infant in NICU). Mom was using the DEBP as LC left the room.  Mom shown how to use DEBP & how to disassemble, clean, & reassemble parts. Mom knows to call RN or LC if she has any questions or concerns. Mom made aware of O/P services, breastfeeding support groups, community resources, and our phone # for post-discharge questions.   Maternal Data Formula Feeding for Exclusion: No Has patient been taught Hand Expression?: Yes Does the patient have breastfeeding experience prior to this delivery?: Yes  Feeding    LATCH Score                   Interventions Interventions: Breast feeding basics reviewed;Hand express;Expressed milk;Breast massage;DEBP  Lactation Tools Discussed/Used WIC Program: No Pump Review: Setup, frequency, and cleaning;Milk Storage Initiated by:: Danelle Earthly, IBCLC Date initiated:: 03/16/20   Consult Status Consult Status: Follow-up Date: 03/16/20 Follow-up type: In-patient    Danelle Earthly 03/16/2020, 1:10 AM

## 2020-03-17 ENCOUNTER — Other Ambulatory Visit (HOSPITAL_COMMUNITY)
Admission: RE | Admit: 2020-03-17 | Discharge: 2020-03-17 | Disposition: A | Discharge status: Home / Self Care | Payer: Commercial Managed Care - PPO | Source: Ambulatory Visit | Attending: Obstetrics and Gynecology | Admitting: Obstetrics and Gynecology

## 2020-03-17 ENCOUNTER — Encounter: Payer: Self-pay | Admitting: *Deleted

## 2020-03-17 LAB — SURGICAL PATHOLOGY

## 2020-03-17 MED ORDER — IBUPROFEN 600 MG PO TABS: 600.0000 mg | ORAL_TABLET | Freq: Four times a day (QID) | ORAL | 0 refills | Status: DC | PRN

## 2020-03-17 MED ORDER — ACETAMINOPHEN 500 MG PO TABS: 1000.0000 mg | ORAL_TABLET | Freq: Four times a day (QID) | ORAL | 0 refills | Status: DC | PRN

## 2020-03-17 NOTE — Progress Notes (Signed)
CSW attempted to meet with MOB to complete psychosocial assessment. MOB requested that CSW come back in 10 minutes, CSW agreed to come back.   Celso Sickle, LCSW Clinical Social Worker [facility] Cell#: [phone]

## 2020-03-17 NOTE — Discharge Instructions (Signed)

## 2020-03-17 NOTE — Progress Notes (Addendum)
Discharge instructions about Postpartum Care after Cesarean & Pre Eclampsia/Eclampsia reviewed and given with pt understanding verbalized.

## 2020-03-17 NOTE — Clinical Social Work Maternal (Signed)
CLINICAL SOCIAL WORK MATERNAL/CHILD NOTE  Patient Details  Name: Monica [name] MRN: [id number] Date of Birth: 05/28/1976  Date:  03/17/2020  Clinical Social Worker Initiating Note:  Issak Goley, LCSW Date/Time: Initiated:  03/17/20/1338     Child's Name:  Ruby Mabel Debarr   Biological Parents:  Mother, Father(Father: John Sarver)   Need for Interpreter:  None   Reason for Referral:  Other (Comment), Behavioral Health Concerns(NICU Admission)   Address:  [address] [place] [address]    Phone number:  [phone] (home)     Additional phone number:   Household Members/Support Persons (HM/SP):   Household Member/Support Person 1, Household Member/Support Person 2, Household Member/Support Person 3   HM/SP Name Relationship DOB or Age  HM/SP -1 John Dame Husband/FOB    HM/SP -2 Millie Folz daughter 7 years old  HM/SP -3 Judson Chaplin son 2 years old  HM/SP -4        HM/SP -5        HM/SP -6        HM/SP -7        HM/SP -8          Natural Supports (not living in the home):  Immediate Family, Church, Extended Family   Professional Supports: None   Employment: Self-employed   Type of Work: Hair Stylist   Education:  Some College   Homebound arranged:    Financial Resources:  Private Insurance   Other Resources:      Cultural/Religious Considerations Which May Impact Care:    Strengths:  Ability to meet basic needs , Pediatrician chosen, Home prepared for child , Understanding of illness   Psychotropic Medications:         Pediatrician:    [place] area  Pediatrician List:   [place] [facility]  [place]    [place]    [place]    [place]    [place]      Pediatrician Fax Number:    Risk Factors/Current Problems:  None   Cognitive State:  Alert , Able to Concentrate , Insightful , Goal Oriented , Linear Thinking    Mood/Affect:  Calm , Happy , Interested , Comfortable     CSW Assessment: CSW met with MOB at bedside to discuss infant's NICU admission and behavioral health concerns. CSW introduced self and explained reason for visit. MOB was welcoming, talkative, open and pleasant during assessment. MOB reported that she resides with her husband and two older children. CSW and MOB discussed MOB's older children and their feelings/perspective feelings about having a new sibling. 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 MOB reported that they have all items needed to care for infant including a car seat, basinet and crib. CSW inquired about MOB's support system, MOB reported that FOB's mom, sister and nephew have been really helpful in caring for the older children while MOB has been in the hospital. MOB shared that her parents are moving to town in two weeks from [place], noting she is excited about their move. MOB reported that her church members are also a source of support. MOB reported having a good support system.  CSW and MOB discussed infant's NICU admission. CSW informed MOB about the NICU, what to expect and resources/supports available. MOB reported that she feels her husband is well informed about infant's care because he has been able to visit more. MOB shared frustrations around not having a time range for infant's discharge. MOB   also verbalized understanding about medical providers not having the ability to provide a range at this time and expressed how she is just ready for infant to be able to go home. CSW acknowledged and validated MOB's feelings. CSW normalized MOB's readiness for infant to discharge home and to have MOB's entire family together. MOB reported that she and FOB plan to stay until infant is ready to be discharged. CSW validated parents decision to stay with infant and informed MOB that it is okay if parents need to go home for any reason including to get rest. CSW emphasized the importance of staying healthy as parents which includes  getting rest and eating. MOB verbalized understanding and reported that they may alternate going home if needed. MOB denied any transportation barriers with visiting infant in the NICU. MOB reported that meal vouchers would be helpful, CSW agreed to leave meal vouchers at infant's bedside. MOB denied any current concerns/questions regarding the NICU.   FOB entered the room. MOB updated FOB on CSW assessment. CSW granted MOB's verbal permission to say anything in front of FOB and continue the assessment.   CSW inquired about MOB's mental health history, MOB reported that she has a history of anxiety and postpartum depression. MOB reported that she experienced postpartum depression with both of her older children. MOB reported that with her daughter the symptoms started immediatly after getting home. MOB reported that she was crying and having anxiety. MOB reported that she went to the doctor and was started on Zoloft which was helpful in treating symptoms. MOB reported that she also took Zoloft when experiencing postpartum depression with her son. MOB reported that she was able to wean off the medication both times and is no longer taking medication. MOB denied any anxiety or depression symptoms. MOB reported that she will contact doctor if she experiences any postpartum depression signs/symptoms and feels she needs to restart her medication. CSW positively affirmed MOB's awareness and willingness to seek treatment if needed. MOB presented calm and did not demonstrate any acute mental health signs/symptoms. MOB was open when discussing her mental health history and treatment. CSW assessed for safety, MOB denied SI and HI. CSW did not assess for domestic violence due to FOB being present.  CSW provided education regarding the baby blues period vs. perinatal mood disorders, discussed treatment and gave resources for mental health follow up if concerns arise.  CSW recommends self-evaluation during the postpartum  time period using the New Mom Checklist from Postpartum Progress and encouraged MOB to contact a medical professional if symptoms are noted at any time.    CSW provided review of Sudden Infant Death Syndrome (SIDS) precautions.    CSW will continue to offer resources/supports while infant is admitted to the NICU.    CSW Plan/Description:  Sudden Infant Death Syndrome (SIDS) Education, Perinatal Mood and Anxiety Disorder (PMADs) Education, Other Patient/Family Education    Baldo Hufnagle L Antanette Richwine, LCSW 03/17/2020, 1:41 PM 

## 2020-03-17 NOTE — Discharge Summary (Signed)
Obstetric Discharge Summary Reason for Admission: cesarean section Prenatal Procedures: ultrasound Intrapartum Procedures: cesarean: low cervical, transverse and tubal ligation Postpartum Procedures: none Complications-Operative and Postpartum: none Hemoglobin  Date Value Ref Range Status  03/16/2020 10.3 (L) 12.0 - 15.0 g/dL Final   HCT  Date Value Ref Range Status  03/16/2020 31.6 (L) 36.0 - 46.0 % Final    Physical Exam:  General: alert, cooperative and no distress Lochia: appropriate Uterine Fundus: firm Incision: healing well DVT Evaluation: No evidence of DVT seen on physical exam.  Discharge Diagnoses: Preelampsia and placental abruption  Discharge Information: Date: 03/17/2020 Activity: pelvic rest Diet: routine Medications: PNV and Ibuprofen Condition: stable Instructions: refer to practice specific booklet Discharge to: home   Newborn Data: Live born female  Birth Weight: 7 lb 2.6 oz (3250 g) APGAR: 8, 8  Newborn Delivery   Birth date/time: 03/15/2020 21:16:00 Delivery type: C-Section, Vacuum Assisted Trial of labor: No C-section categorization: Repeat      Home with mother.  Roselle Locus II 03/17/2020, 10:47 AM

## 2020-03-22 ENCOUNTER — Ambulatory Visit: Payer: Self-pay

## 2020-03-22 NOTE — Lactation Note (Signed)
This note was copied from a baby's chart. Lactation Consultation Note  Patient Name: Monica [name] [id number] Date: 03/22/2020 Reason for consult: Follow-up assessment   LC Follow Up Visit:  Attempted to visit with mother, however, she is asleep at this time.  Baby is getting her hearing screen completed.  Asked RN to call me if mother has any questions or concerns.     Consult Status Consult Status: PRN Date: 03/22/20 Follow-up type: Call as needed    Birt Reinoso R Jimesha Rising 03/22/2020, 11:07 AM

## 2020-03-24 ENCOUNTER — Ambulatory Visit: Payer: Self-pay

## 2020-03-24 NOTE — Lactation Note (Signed)
This note was copied from a baby's chart. Lactation Consultation Note  Patient Name: Monica [name] [id number] Date: 03/24/2020 Reason for consult: Follow-up assessment  P3 mother whose infant is now 60 days old.  This is a LPTI born at 36+3 weeks with a CGA of 37+5 weeks.  Mother breast fed her first child (now 42 years old) for 12 months and her second child (now 68 years old) for 15 months.  Mother was excited to inform me that baby breast fed well at the 0900 feeding and fed non stop for 10 minutes.  Mother heard swallows and baby still content at the 1200 feeding.  When I arrived mother was trying to latch her, however, she remained very sleepy and did not arouse.  I suggested mother limit attempt time to 5-10 minutes and if infant shows no interest she should stop trying to latch.  Mother verbalized understanding.    Mother had no other questions/concerns and is very knowledgeable about breast feeding.  She has a DEBP for home use and has worked out a good schedule between NICU time and family time.  She is spending some time at home sleeping rather than staying here every night.  Discussed a healthy balance between NICU visits, eating, sleeping and family time.  Mother enjoys her new routine.    Encouraged her to continue feeding as baby shows readiness.  She can call lactation for assistance as needed.   Maternal Data    Feeding Feeding Type: Breast Fed  LATCH Score Latch: Grasps breast easily, tongue down, lips flanged, rhythmical sucking.  Audible Swallowing: Spontaneous and intermittent  Type of Nipple: Everted at rest and after stimulation  Comfort (Breast/Nipple): Soft / non-tender  Hold (Positioning): No assistance needed to correctly position infant at breast.  LATCH Score: 10  Interventions    Lactation Tools Discussed/Used     Consult Status Consult Status: Follow-up Date: 03/25/20 Follow-up type: In-patient    Lashanda Storlie R Maks Cavallero 03/24/2020, 12:30 PM

## 2020-03-26 ENCOUNTER — Ambulatory Visit: Payer: Self-pay

## 2020-03-26 NOTE — Lactation Note (Signed)
This note was copied from a baby's chart. Lactation Consultation Note  Patient Name: Monica [name] [id number] Date: 03/26/2020 Reason for consult: NICU baby  Infant is 50 days old (CGA of 38w) & is going home today. Infant has been exclusively breast milk fed (& exclusively breastfed for the last 24 hrs) and has gained 75 g over the last 24 hrs. Mom is a P3. I asked if she had any interest in seeing an outpatient lactation consultant, but she declined.   Lurline Hare [facility] 03/26/2020, 9:38 AM

## 2022-02-23 ENCOUNTER — Encounter: Payer: Commercial Managed Care - PPO | Attending: Surgery | Admitting: Skilled Nursing Facility1

## 2022-02-23 ENCOUNTER — Encounter: Payer: Self-pay | Admitting: Skilled Nursing Facility1

## 2022-02-23 DIAGNOSIS — Z6841 Body Mass Index (BMI) 40.0 and over, adult: Secondary | ICD-10-CM | POA: Insufficient documentation

## 2022-02-23 DIAGNOSIS — E669 Obesity, unspecified: Secondary | ICD-10-CM

## 2022-02-23 NOTE — Progress Notes (Signed)
Nutrition Assessment for Bariatric Surgery ?Medical Nutrition Therapy ?Appt Start Time: 1:56    End Time: 2:56 ? ?Patient was seen on 02/23/2022 for Pre-Operative Nutrition Assessment. Letter of approval faxed to [facility] bariatric surgery program coordinator on 02/23/2022.  ? ?Referral stated Supervised Weight Loss (SWL) visits needed: 0 ? ?Pt completed visits. ?  ?Pt has cleared nutrition requirements.  ? ? ?Planned surgery: RYGB ?Pt expectation of surgery: to lose weight  ?Pt expectation of dietitian: none stated ? ?  ?NUTRITION ASSESSMENT ?  ?Anthropometrics  ?Start weight at [facility]: 284 lbs (date: 02/23/2022)  ?Height: 67.5 in ?BMI: 43.82 kg/m2   ?  ?Clinical  ?Medical hx: post partum depression, anxiety  ?Medications: see list: multivitamin   ?Labs: cholesterol 204, LDL 128, Iron 12, vitamin D 28.7 ?Notable signs/symptoms: N/A ?Any previous deficiencies? No ? ?Micronutrient Nutrition Focused Physical Exam: ?Hair: No issues observed ?Eyes: No issues observed ?Mouth: No issues observed ?Neck: No issues observed ?Nails: No issues observed ?Skin: No issues observed ? ?Lifestyle & Dietary Hx ? ?Pt state she really wants to be a good role model for her children outside of the context of weight focus.   ?Pt states her family does not eat non starchy vegetables but she herself likes them.  ?Pt states her focus is overall health and well being and that surgery will help her overall reach her goal of health and physical activity. ? ?24-Hr Dietary Recall ?First Meal: coffee + creamer + muffin or toast ?Snack: cookies or candy ?Second Meal: salad or egg salad sandwich ?Snack: granola bar or banana ?Third Meal: chicken alfredo ?Snack:  ?Beverages: seltzer water, coffee + creamer ?  ?Estimated Energy Needs ?Calories: 1600 ? ? ?NUTRITION DIAGNOSIS  ?Overweight/obesity ([place]-3.3) related to past poor dietary habits and physical inactivity as evidenced by patient w/ planned RYGB surgery following dietary guidelines  for continued weight loss. ?  ? ?NUTRITION INTERVENTION  ?Nutrition counseling (C-1) and education (E-2) to facilitate bariatric surgery goals. ? ?Educated pt on micronutrient deficiencies post surgery and strategies to mitigate that risk ?  ?Pre-Op Goals Reviewed with the Patient ?Track food and beverage intake (pen and paper, MyFitness Pal, Baritastic app, etc.) ?Make healthy food choices while monitoring portion sizes ?Consume 3 meals per day or try to eat every 3-5 hours ?Avoid concentrated sugars and fried foods ?Keep sugar & fat in the single digits per serving on food labels ?Practice CHEWING your food (aim for applesauce consistency) ?Practice not drinking 15 minutes before, during, and 30 minutes after each meal and snack ?Avoid all carbonated beverages (ex: soda, sparkling beverages)  ?Limit caffeinated beverages (ex: coffee, tea, energy drinks) ?Avoid all sugar-sweetened beverages (ex: regular soda, sports drinks)  ?Avoid alcohol  ?Aim for 64-100 ounces of FLUID daily (with at least half of fluid intake being plain water)  ?Aim for at least 60-80 grams of PROTEIN daily ?Look for a liquid protein source that contains ?15 g protein and ?5 g carbohydrate (ex: shakes, drinks, shots) ?Make a list of non-food related activities ?Physical activity is an important part of a healthy lifestyle so keep it moving! The goal is to reach 150 minutes of exercise per week, including cardiovascular and weight baring activity. ? ?*Goals that are bolded indicate the pt would like to start working towards these ? ?Handouts Provided Include  ?Bariatric Surgery handouts (Nutrition Visits, Pre-Op Goals, Protein Shakes, Vitamins & Minerals) ? ?Learning Style & Readiness for Change ?Teaching method utilized: Visual & Auditory  ?Demonstrated degree  of understanding via: Teach Back  ?Readiness Level: Ready ?Barriers to learning/adherence to lifestyle change: none identified ? ? ?  ?MONITORING & EVALUATION ?Dietary intake, weekly  physical activity, body weight, and pre-op goals reached at next nutrition visit.  ?  ?Next Steps  ?Patient is to follow up at [facility] for Pre-Op Class >2 weeks before surgery for further nutrition education.  ?Pt has completed visits. No further supervised visits required/recomended  ?

## 2022-02-27 ENCOUNTER — Other Ambulatory Visit (HOSPITAL_COMMUNITY): Payer: Self-pay | Admitting: Surgery

## 2022-02-27 ENCOUNTER — Other Ambulatory Visit: Payer: Self-pay | Admitting: Surgery

## 2022-02-28 ENCOUNTER — Ambulatory Visit: Payer: Self-pay | Admitting: Surgery

## 2022-03-07 ENCOUNTER — Ambulatory Visit (HOSPITAL_COMMUNITY)
Admission: RE | Admit: 2022-03-07 | Discharge: 2022-03-07 | Disposition: A | Discharge status: Home / Self Care | Payer: Commercial Managed Care - PPO | Source: Ambulatory Visit | Attending: Surgery | Admitting: Surgery

## 2022-03-07 DIAGNOSIS — Z6841 Body Mass Index (BMI) 40.0 and over, adult: Secondary | ICD-10-CM | POA: Diagnosis present

## 2022-03-16 ENCOUNTER — Ambulatory Visit (INDEPENDENT_AMBULATORY_CARE_PROVIDER_SITE_OTHER): Payer: Commercial Managed Care - PPO | Admitting: Licensed Clinical Social Worker

## 2022-03-16 DIAGNOSIS — F4322 Adjustment disorder with anxiety: Secondary | ICD-10-CM

## 2022-03-17 NOTE — Progress Notes (Signed)
Comprehensive Clinical Assessment (CCA) Note ? ?03/17/2022 ?Monica [name] ?[id number] ? ?Chief Complaint:  ?Chief Complaint  ?Patient presents with  ? Obesity  ? ?Visit Diagnosis: Adjustment disorder with anxious mood   ? ?CCA Biopsychosocial ?Intake/Chief Complaint:  Bariatric Surgery ? ?Current Symptoms/Problems: experienced post partum anxiety but nothing currently ? ? ?Patient Reported Schizophrenia/Schizoaffective Diagnosis in Past: No ? ? 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 ?Preferences: Prefers getting out with friends at times, prefers spending time with family, prefers chill time/relaxed time, prefers downtime,  doesn't prefer drama, ? 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 ? ?Type of Services Patient Feels are Needed: Bariatric ? ? ?Initial Clinical Notes/Concerns: Weight issues since childhood, weight has flucuated throughout life, all 3 childdren were C-sections: 2013, 2018, 2021, Bone spur removal/achellies heel-1996, tonciletimy 1986. throat surgery-2002, lasik-2005, ? ? ?Mental Health Symptoms ?Depression:   ?None ?  ?Duration of Depressive symptoms: No data recorded  ?Mania:   ?None ?  ?Anxiety:    ?None ?  ?Psychosis:   ?None ?  ?Duration of Psychotic symptoms: No data recorded  ?Trauma:   ?None ?  ?Obsessions:   ?None ?  ?Compulsions:   ?None ?  ?Inattention:   ?None ?  ?Hyperactivity/Impulsivity:  No data recorded  ?Oppositional/Defiant Behaviors:   ?None ?  ?Emotional Irregularity:   ?None ?  ?Other Mood/Personality Symptoms:   ?N/A ?  ? ?Mental Status Exam ?Appearance and self-care  ?Stature:  Average ?  ?Weight:  Obese ?  ?Clothing:  Casual ?  ?Grooming:  Normal ?  ?Cosmetic use:  Age appropriate ?  ?Posture/gait:  Normal ?  ?Motor activity:  Not Remarkable ?  ?Sensorium  ?Attention:  Normal ?  ?Concentration:  Normal ?  ?Orientation:  X5 ?   ?Recall/memory:  Normal ?  ?Affect and Mood  ?Affect:  Appropriate ?  ?Mood:  Euthymic ?  ?Relating  ?Eye contact:  Normal ?  ?Facial expression:  Responsive ?  ?Attitude toward examiner:  Cooperative ?  ?Thought and Language  ?Speech flow: Normal ?  ?Thought content:  Appropriate to Mood and Circumstances ?  ?Preoccupation:  None ?  ?Hallucinations:  None ?  ?Organization:  No data recorded  ?Executive Functions  ?Fund of Knowledge:  Good ?  ?Intelligence:  Below average ?  ?Abstraction:  Normal ?  ?Judgement:  Normal ?  ?Reality Testing:  Adequate ?  ?Insight:  Good ?  ?Decision Making:  Normal ?  ?Social Functioning  ?Social Maturity:  Responsible ?  ?Social Judgement:  Normal ?  ?Stress  ?Stressors:  Other (Comment) (Being a mother at times is stressful for her) ?  ?Coping Ability:  No data recorded  ?Skill Deficits:  Activities of daily living ?  ?Supports:  Family ?  ? ? ?Religion: ?Religion/Spirituality ?Are You A Religious Person?: Yes ?What is Your Religious Affiliation?: Non-Denominational ?How Might This Affect Treatment?: Support treatment ? ?Leisure/Recreation: ?Leisure / Recreation ?Do You Have Hobbies?: Yes ?Leisure and Hobbies: Barrister's clerk, creative ? ?Exercise/Diet: ?Exercise/Diet ?Do You Exercise?: No ?Have You Gained or Lost A Significant Amount of Weight in the Past Six Months?: No ?Do You Follow a Special Diet?: No ?Do You Have Any Trouble Sleeping?: No ? ? 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 ?Education: ?Education ?Is Patient Currently Attending School?: No ?Last Grade Completed: 12 ?Name of High School: [employer] ?Did You Graduate From [employer]?: Yes ?Did You Attend College?: No ([employer]) ?Did You Attend Graduate School?: No ?Did You Have Any Special Interests In School?: [place] ?Did You Have An Individualized Education Program (IIEP): No ?Did You Have Any Difficulty At School?: No ?Patient's Education Has Been Impacted by Current Illness: No ? ? ?CCA Family/Childhood History ?Family and Relationship History: ?Family history ?Marital status: Married ?Number of Years Married: 6 ?What types of issues is patient dealing with in the relationship?: None ?Additional relationship information: None ?Are you sexually active?: Yes ?What is your sexual orientation?: Heterosexual ?Has your sexual activity been affected by drugs, alcohol, medication, or emotional stress?: busy and tired as well as how they both feel about their bodies ?Does patient have children?: Yes ?How many children?: 3 ?How is patient's relationship with their children?: Great ? ?Childhood History:  ?Childhood History ?By whom was/is the patient raised?: Both parents ?Additional childhood history information: Both parents in the home. Patient describes childhood as "ok" ?Description of patient's relationship with caregiver when they were a child: Mother: mother yelled alot, good   Father: father worked a lot, good ?Patient's description of current relationship with people who raised him/her: Mother: better than before,  Father: better than before ?How were you disciplined when you got in trouble as a child/adolescent?: spanked, talked to things away, grounded ?Does patient have siblings?: Yes ?Number of Siblings: 2 ?Description of patient's current relationship with siblings: Older sister, younger brother: not great relationship ?Did patient suffer any verbal/emotional/physical/sexual abuse as a child?:   (mother made hurtful comments about her weight at times) ?Did patient suffer from severe childhood neglect?: No ?Has patient ever been sexually abused/assaulted/raped as an adolescent or adult?: No ?Was the patient ever a victim of a crime or a disaster?: No ?Witnessed domestic violence?: No ?Has patient been affected by domestic violence as an adult?: No ? ?Child/Adolescent Assessment: ?  ? ? ?CCA Substance Use ?Alcohol/Drug Use: ?Alcohol / Drug Use ?Pain Medications: See patient MAR ?Prescriptions: See patient MAR ?Over the Counter: See patient MAR ?History of alcohol / drug use?: No history of alcohol / drug abuse ?  ?  ?  ?  ?   ?  ?  ?  ?  ?  ? ? ? ?ASAM's:  Six Dimensions of Multidimensional Assessment ? ?Dimension 1:  Acute Intoxication and/or Withdrawal Potential:   ?Dimension 1:  Description of individual's past and current experiences of substance use and withdrawal: None  ?Dimension 2:  Biomedical Conditions and Complications:   ?Dimension 2:  Description of patient's biomedical conditions and  complications: None  ?Dimension 3:  Emotional, Behavioral, or Cognitive Conditions and Complications:  Dimension 3:  Description of emotional, behavioral, or cognitive conditions and complications: None  ?Dimension 4:  Readiness to Change:  Dimension 4:  Description of Readiness to Change criteria: None  ?Dimension 5:  Relapse, Continued use, or Continued Problem Potential:  Dimension 5:  Relapse, continued use, or continued problem potential critiera description: None  ?Dimension 6:  Recovery/Living Environment:  Dimension  6:  Recovery/Iiving environment criteria description: None  ?ASAM Severity Score: ASAM's Severity Rating Score: 0  ?ASAM Recommended Level of Treatment:    ? ?Substance use Disorder (SUD) ?  ? ?Recommendations for Services/Supports/Treatments: ?Recommendations for Services/Supports/Treatments ?Recommendations For Services/Supports/Treatments: Other (Comment) (Bariatric Surgery) ? ?DSM5  Diagnoses: ?Patient Active Problem List  ? Diagnosis Date Noted  ? Pregnant and not yet delivered in third trimester 03/15/2020  ? S/P cesarean section 10/20/2017  ? ? ?Patient Centered Plan: ?Patient is on the following Treatment Plan(

## 2022-05-22 NOTE — Progress Notes (Signed)
Attempted to obtain medical history via telephone, unable to reach at this time. HIPAA compliant voicemail message left requesting return call to pre surgical testing department. 

## 2022-05-31 ENCOUNTER — Ambulatory Visit (HOSPITAL_COMMUNITY)
Admission: RE | Admit: 2022-05-31 | Discharge: 2022-05-31 | Disposition: A | Discharge status: Home / Self Care | Payer: Commercial Managed Care - PPO | Attending: Surgery | Admitting: Surgery

## 2022-05-31 ENCOUNTER — Encounter (HOSPITAL_COMMUNITY)
Admission: RE | Disposition: A | Discharge status: Home / Self Care | Payer: Self-pay | Source: Home / Self Care | Attending: Surgery

## 2022-05-31 ENCOUNTER — Other Ambulatory Visit: Payer: Self-pay

## 2022-05-31 ENCOUNTER — Ambulatory Visit (HOSPITAL_COMMUNITY): Payer: Commercial Managed Care - PPO | Admitting: Anesthesiology

## 2022-05-31 ENCOUNTER — Ambulatory Visit (HOSPITAL_BASED_OUTPATIENT_CLINIC_OR_DEPARTMENT_OTHER): Payer: Commercial Managed Care - PPO | Admitting: Anesthesiology

## 2022-05-31 ENCOUNTER — Encounter (HOSPITAL_COMMUNITY): Payer: Self-pay | Admitting: Surgery

## 2022-05-31 DIAGNOSIS — K2970 Gastritis, unspecified, without bleeding: Secondary | ICD-10-CM | POA: Diagnosis not present

## 2022-05-31 DIAGNOSIS — K449 Diaphragmatic hernia without obstruction or gangrene: Secondary | ICD-10-CM | POA: Diagnosis not present

## 2022-05-31 DIAGNOSIS — F32A Depression, unspecified: Secondary | ICD-10-CM | POA: Insufficient documentation

## 2022-05-31 DIAGNOSIS — K2950 Unspecified chronic gastritis without bleeding: Secondary | ICD-10-CM | POA: Diagnosis not present

## 2022-05-31 DIAGNOSIS — J45909 Unspecified asthma, uncomplicated: Secondary | ICD-10-CM | POA: Diagnosis not present

## 2022-05-31 DIAGNOSIS — Z6841 Body Mass Index (BMI) 40.0 and over, adult: Secondary | ICD-10-CM | POA: Diagnosis not present

## 2022-05-31 DIAGNOSIS — F419 Anxiety disorder, unspecified: Secondary | ICD-10-CM | POA: Insufficient documentation

## 2022-05-31 DIAGNOSIS — Z01818 Encounter for other preprocedural examination: Secondary | ICD-10-CM | POA: Insufficient documentation

## 2022-05-31 DIAGNOSIS — K31A11 Gastric intestinal metaplasia without dysplasia, involving the antrum: Secondary | ICD-10-CM | POA: Diagnosis not present

## 2022-05-31 DIAGNOSIS — K219 Gastro-esophageal reflux disease without esophagitis: Secondary | ICD-10-CM | POA: Insufficient documentation

## 2022-05-31 HISTORY — DX: Other specified postprocedural states: Z98.890

## 2022-05-31 HISTORY — PX: ESOPHAGOGASTRODUODENOSCOPY: SHX5428

## 2022-05-31 HISTORY — DX: Nausea with vomiting, unspecified: R11.2

## 2022-05-31 HISTORY — PX: BIOPSY: SHX5522

## 2022-05-31 SURGERY — EGD (ESOPHAGOGASTRODUODENOSCOPY)
Anesthesia: Monitor Anesthesia Care

## 2022-05-31 MED ORDER — PROPOFOL 500 MG/50ML IV EMUL
INTRAVENOUS | Status: AC
  Filled 2022-05-31: qty 50

## 2022-05-31 MED ORDER — LIDOCAINE 2% (20 MG/ML) 5 ML SYRINGE
INTRAMUSCULAR | Status: DC | PRN
  Administered 2022-05-31: 100 mg via INTRAVENOUS

## 2022-05-31 MED ORDER — LACTATED RINGERS IV SOLN
INTRAVENOUS | Status: DC
  Administered 2022-05-31: 1000 mL via INTRAVENOUS

## 2022-05-31 MED ORDER — PROPOFOL 10 MG/ML IV BOLUS
INTRAVENOUS | Status: DC | PRN
  Administered 2022-05-31 (×5): 20 mg via INTRAVENOUS

## 2022-05-31 MED ORDER — PROPOFOL 500 MG/50ML IV EMUL
INTRAVENOUS | Status: DC | PRN
  Administered 2022-05-31: 125 ug/kg/min via INTRAVENOUS

## 2022-05-31 NOTE — Anesthesia Preprocedure Evaluation (Signed)
Anesthesia Evaluation  Patient identified by MRN, date of birth, ID band Patient awake    Reviewed: Allergy & Precautions, NPO status , Patient's Chart, lab work & pertinent test results  History of Anesthesia Complications (+) PONV and history of anesthetic complications  Airway Mallampati: III  TM Distance: >3 FB Neck ROM: Full    Dental  (+) Teeth Intact, Dental Advisory Given   Pulmonary asthma ,    Pulmonary exam normal breath sounds clear to auscultation       Cardiovascular negative cardio ROS Normal cardiovascular exam Rhythm:Regular Rate:Normal     Neuro/Psych PSYCHIATRIC DISORDERS Anxiety Depression negative neurological ROS     GI/Hepatic Neg liver ROS, GERD  ,  Endo/Other  Morbid obesity  Renal/GU negative Renal ROS     Musculoskeletal negative musculoskeletal ROS (+)   Abdominal   Peds  Hematology negative hematology ROS (+)   Anesthesia Other Findings Day of surgery medications reviewed with the patient.  Reproductive/Obstetrics                             Anesthesia Physical Anesthesia Plan  ASA: 3  Anesthesia Plan: MAC   Post-op Pain Management:    Induction: Intravenous  PONV Risk Score and Plan: 3 and TIVA and Treatment may vary due to age or medical condition  Airway Management Planned: Nasal Cannula and Natural Airway  Additional Equipment:   Intra-op Plan:   Post-operative Plan:   Informed Consent: I have reviewed the patients History and Physical, chart, labs and discussed the procedure including the risks, benefits and alternatives for the proposed anesthesia with the patient or authorized representative who has indicated his/her understanding and acceptance.     Dental advisory given  Plan Discussed with: CRNA and Anesthesiologist  Anesthesia Plan Comments:         Anesthesia Quick Evaluation

## 2022-05-31 NOTE — Op Note (Signed)
[facility] Patient Name: Monica [name] Procedure Date: 05/31/2022 MRN: [id number] Attending MD: Felicie Morn ,  Date of Birth: 17-Mar-1976 CSN: [id number] Age: 46 Admit Type: Outpatient Procedure:                Upper GI endoscopy Indications:               Providers:                Felicie Morn, Carlyn Reichert, RN, [facility]                            Technician, Technician Referring MD:              Medicines:                Monitored Anesthesia Care Complications:             Estimated Blood Loss:     Estimated blood loss was minimal. Procedure:                Pre-Anesthesia Assessment:                           - Prior to the procedure, a History and Physical                            was performed, and patient medications and                            allergies were reviewed. The patient is competent.                            The risks and benefits of the procedure and the                            sedation options and risks were discussed with the                            patient. All questions were answered and informed                            consent was obtained. Patient identification and                            proposed procedure were verified by the physician                            in the pre-procedure area. Mental Status                            Examination: alert and oriented. Airway                            Examination: normal oropharyngeal airway and neck                            mobility. Respiratory Examination: clear to  auscultation. CV Examination: normal. ASA Grade                            Assessment: II - A patient with mild systemic                            disease. After reviewing the risks and benefits,                            the patient was deemed in satisfactory condition to                            undergo the procedure. The anesthesia plan was to                             use monitored anesthesia care (MAC). Immediately                            prior to administration of medications, the patient                            was re-assessed for adequacy to receive sedatives.                            The heart rate, respiratory rate, oxygen                            saturations, blood pressure, adequacy of pulmonary                            ventilation, and response to care were monitored                            throughout the procedure. The physical status of                            the patient was re-assessed after the procedure.                           After obtaining informed consent, the endoscope was                            passed under direct vision. Throughout the                            procedure, the patient's blood pressure, pulse, and                            oxygen saturations were monitored continuously. The                            [id number]-H190 ([id number] Olympus endoscope was introduced  through the mouth, and advanced to the second part                            of duodenum. The upper GI endoscopy was                            accomplished without difficulty. The patient                            tolerated the procedure well. Scope In: Scope Out: Findings:      The gastroesophageal flap valve was visualized endoscopically and       classified as Hill Grade IV (no fold, wide open lumen, hiatal hernia       present).      The examined esophagus was normal.      Diffuse mild inflammation was found in the stomach. Biopsies were taken       with a cold forceps for Helicobacter pylori testing. Verification of       patient identification for the specimen was done. Estimated blood loss       was minimal.      The in the duodenum was normal. 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                           - Normal esophagus.                           - Gastritis. Biopsied.                           - Normal. Moderate Sedation:      Moderate (conscious) sedation was personally administered by an       anesthesia professional. The following parameters were monitored: oxygen       saturation, heart rate, blood pressure, and response to care. Total       physician intraservice time was 15 minutes. Recommendation:            Procedure Code(s):        --- Professional ---                           [phone], Esophagogastroduodenoscopy, flexible,                            transoral; with biopsy, single or multiple Diagnosis Code(s):        --- Professional ---                           K44.9, Diaphragmatic hernia without obstruction or  gangrene                           K29.70, Gastritis, unspecified, without bleeding CPT copyright 2019 American Medical Association. All rights reserved. The codes documented in this report are preliminary and upon coder review may  be revised to meet current compliance requirements. Kennedy,  05/31/2022 1:49:19 PM Number of Addenda: 0

## 2022-05-31 NOTE — Transfer of Care (Signed)
Immediate Anesthesia Transfer of Care Note  Patient: Monica [name]  Procedure(s) Performed: ESOPHAGOGASTRODUODENOSCOPY (EGD) BIOPSY  Patient Location: Endoscopy Unit  Anesthesia Type:MAC  Level of Consciousness: drowsy  Airway & Oxygen Therapy: Patient Spontanous Breathing and Patient connected to face mask oxygen  Post-op Assessment: Report given to RN and Post -op Vital signs reviewed and stable  Post vital signs: Reviewed and stable  Last Vitals:  Vitals Value Taken Time  BP 129/83 05/31/22 1351  Temp    Pulse 75 05/31/22 1352  Resp 25 05/31/22 1352  SpO2 100 % 05/31/22 1352  Vitals shown include unvalidated device data.  Last Pain:  Vitals:   05/31/22 1202  TempSrc: Temporal  PainSc: 0-No pain         Complications: No notable events documented.

## 2022-05-31 NOTE — H&P (Signed)
Admitting Physician: Hyman Hopes Reyes Aldaco  Service: Bariatric Surgery  CC: Preoperative testing, obesity  Subjective   HPI: Monica [name] is an 46 y.o. female who is here for EGD prior to gastric bypass.  Past Medical History:  Diagnosis Date   AMA (advanced maternal age) multigravida 35+    Anxiety    Asthma    Depression    had PP depression   HSV (herpes simplex virus) anogenital infection    Hx of HELLP syndrome, currently pregnant    PONV (postoperative nausea and vomiting)    Vaginal Pap smear, abnormal     Past Surgical History:  Procedure Laterality Date   ADENOIDECTOMY     bone spur removal     CESAREAN SECTION  2013   CESAREAN SECTION N/A 10/20/2017   Procedure: REPEAT CESAREAN SECTION WITH SUPRA CLITORAL SKIN TAG REMOVAL;  Surgeon: Zelphia Cairo, MD;  Location: [facility] B[facility]ING SU[facility];  Service: Obstetrics;  Laterality: N/A;  Repeat edc 11/05/17 nkda    CESAREAN SECTION WITH BILATERAL TUBAL LIGATION N/A 03/15/2020   Procedure: CESAREAN SECTION WITH BILATERAL TUBAL LIGATION;  Surgeon: Ranae Pila, MD;  Location: [facility] LD ORS;  Service: Obstetrics;  Laterality: N/A;   COLPOSCOPY     tonsil suregery     additional tissue removal   TONSILLECTOMY      Family History  Problem Relation Age of Onset   Hypertension Mother    Hashimoto's thyroiditis Mother    Hypertension Father    Skin cancer Paternal Grandfather    Stroke Paternal Grandfather    Allergic rhinitis Neg Hx    Angioedema Neg Hx    Asthma Neg Hx    Atopy Neg Hx    Eczema Neg Hx    Immunodeficiency Neg Hx    Urticaria Neg Hx     Social:  reports that she has never smoked. She has never used smokeless tobacco. She reports that she does not drink alcohol and does not use drugs.  Allergies: No Known Allergies  Medications: Current Outpatient Medications  Medication Instructions   cholecalciferol (VITAMIN D3) 1,000 Units, Oral, Daily at bedtime   ibuprofen (ADVIL) 800 mg, Oral,  Daily at bedtime   levocetirizine (XYZAL) 5 mg, Oral, Every evening   Multiple Vitamins-Minerals (MULTIVITAMIN WITH MINERALS) tablet 1 tablet, Oral, Daily   naproxen sodium (ALEVE) 220 mg, Oral, Daily PRN   valACYclovir (VALTREX) 500 mg, Oral, Daily    ROS - all of the below systems have been reviewed with the patient and positives are indicated with bold text General: chills, fever or night sweats Eyes: blurry vision or double vision ENT: epistaxis or sore throat Allergy/Immunology: itchy/watery eyes or nasal congestion Hematologic/Lymphatic: bleeding problems, blood clots or swollen lymph nodes Endocrine: temperature intolerance or unexpected weight changes Breast: new or changing breast lumps or nipple discharge Resp: cough, shortness of breath, or wheezing CV: chest pain or dyspnea on exertion GI: as per HPI GU: dysuria, trouble voiding, or hematuria MSK: joint pain or joint stiffness Neuro: TIA or stroke symptoms Derm: pruritus and skin lesion changes Psych: anxiety and depression  Objective   PE Blood pressure (!) 147/80, pulse 76, temperature 98 F (36.7 C), temperature source Temporal, resp. rate 18, height 5\' 7"  (1.702 m), weight 129.3 kg, SpO2 99 %, not currently breastfeeding. Constitutional: NAD; conversant; no deformities Eyes: Moist conjunctiva; no lid lag; anicteric; PERRL Neck: Trachea midline; no thyromegaly Lungs: Normal respiratory effort; no tactile fremitus CV: RRR; no palpable thrills; no pitting edema  GI: Abd Soft, nontender; no palpable hepatosplenomegaly MSK: Normal range of motion of extremities; no clubbing/cyanosis Psychiatric: Appropriate affect; alert and oriented x3 Lymphatic: No palpable cervical or axillary lymphadenopathy  No results found for this or any previous visit (from the past 24 hour(s)).  Imaging Orders  No imaging studies ordered today     Assessment and Plan   Monica [name] is a 46 y.o. female who is seen today as an office  consultation at the request of Dr. Seymour Bars for initial bariatric surgery consultation. The patient has morbid obesity with a BMI of Body mass index is 43.03 kg/m. and the following conditions related to obesity: None.  Today we discussed the surgical options to treat obesity and its associated comorbidity. After discussing the available procedures in the region, we discussed in great detail the surgeries I offer: robotic sleeve gastrectomy and robotic roux-en-y gastric bypass. We discussed the procedures themselves as well as their risks, benefits and alternatives. I entered the patient's basic information into the [facility] Metabolic Surgery Risk/Benefit Calculator to facilitate this discussion.   After a full discussion and all questions answered, the patient is interested in pursuing a robotic gastric bypass with upper endoscopy.  We will initiate the bariatric surgery preoperative pathway to include the following: - Bloodwork - Dietician consult - Completed with Serena Colonel, RD - Chest x-ray - Normal 03/07/22 - EKG - Normal 03/07/22 - Psychology evaluation  - Upper endoscopy with biopsy. I explained my rational for performing upper endoscopy in my bariatric patients. During the procedure I will biopsy for H. Pylori, evaluate for hiatal hernia, evaluate for reflux esophagitis and look for any other abnormalities that may influence the procedure. We discussed the risks, benefits and alternative to this procedure and the patient granted consent to proceed.  Today we will proceed with EGD.   Quentin Ore, MD  [facility] Use [email or web address] to contact on call provider

## 2022-05-31 NOTE — Anesthesia Procedure Notes (Addendum)
Date/Time: 05/31/2022 1:33 PM  Performed by: Florene Route, CRNAOxygen Delivery Method: Simple face mask

## 2022-06-01 ENCOUNTER — Encounter (HOSPITAL_COMMUNITY): Payer: Self-pay | Admitting: Surgery

## 2022-06-01 NOTE — Anesthesia Postprocedure Evaluation (Signed)
Anesthesia Post Note  Patient: Monica [name]  Procedure(s) Performed: ESOPHAGOGASTRODUODENOSCOPY (EGD) BIOPSY     Patient location during evaluation: Endoscopy Anesthesia Type: MAC Level of consciousness: oriented, awake and alert and awake Pain management: pain level controlled Vital Signs Assessment: post-procedure vital signs reviewed and stable Respiratory status: spontaneous breathing, nonlabored ventilation, respiratory function stable and patient connected to nasal cannula oxygen Cardiovascular status: blood pressure returned to baseline and stable Postop Assessment: no headache, no backache and no apparent nausea or vomiting Anesthetic complications: no   No notable events documented.  Last Vitals:  Vitals:   05/31/22 1419 05/31/22 1420  BP: (!) 160/85   Pulse: 70 72  Resp: (!) 21 18  Temp:    SpO2: 100% 99%    Last Pain:  Vitals:   05/31/22 1420  TempSrc:   PainSc: 0-No pain                 Santa Lighter

## 2022-06-02 LAB — SURGICAL PATHOLOGY

## 2022-06-19 ENCOUNTER — Ambulatory Visit: Payer: Self-pay | Admitting: Surgery

## 2022-06-19 DIAGNOSIS — Z01818 Encounter for other preprocedural examination: Secondary | ICD-10-CM

## 2022-07-03 ENCOUNTER — Encounter: Payer: Self-pay | Admitting: Skilled Nursing Facility1

## 2022-07-03 ENCOUNTER — Encounter: Payer: Commercial Managed Care - PPO | Attending: Surgery | Admitting: Skilled Nursing Facility1

## 2022-07-03 NOTE — Progress Notes (Signed)
Pre-Operative Nutrition Class:    Patient was seen on 07/03/2022 for Pre-Operative Bariatric Surgery Education at the [facility].    Surgery date: 08/07/2022 Surgery type: RYGB Start weight at [facility]: 284 Weight today: 284.8  Samples given per MNT protocol. Patient educated on appropriate usage: Bariatric Advantage Multivitamin Lot # [id number] Exp: 08/24   Bariatric Advantage Calcium  Lot # [id number] Exp: 03/01/2023   Protein Shake Lot # [id number] / [id number] Exp: 20 Aug 2022 /  20 Nov 2022  The following the learning objectives were met by the patient during this course: Identify Pre-Op Dietary Goals and will begin 2 weeks pre-operatively Identify appropriate sources of fluids and proteins  State protein recommendations and appropriate sources pre and post-operatively Identify Post-Operative Dietary Goals and will follow for 2 weeks post-operatively Identify appropriate multivitamin and calcium sources Describe the need for physical activity post-operatively and will follow MD recommendations State when to call healthcare provider regarding medication questions or post-operative complications When having a diagnosis of diabetes understanding hypoglycemia symptoms and the inclusion of 1 complex carbohydrate per meal  Handouts given during class include: Pre-Op Bariatric Surgery Diet Handout Protein Shake Handout Post-Op Bariatric Surgery Nutrition Handout BELT Program Information Flyer Support Group Information Flyer [facility] Bariatric Supplements Price List  Follow-Up Plan: Patient will follow-up at [facility] 2 weeks post operatively for diet advancement per MD.

## 2022-07-06 ENCOUNTER — Emergency Department (HOSPITAL_BASED_OUTPATIENT_CLINIC_OR_DEPARTMENT_OTHER): Payer: Commercial Managed Care - PPO

## 2022-07-06 ENCOUNTER — Inpatient Hospital Stay (HOSPITAL_BASED_OUTPATIENT_CLINIC_OR_DEPARTMENT_OTHER)
Admission: EM | Admit: 2022-07-06 | Discharge: 2022-07-11 | DRG: 372 | Disposition: A | Discharge status: Home / Self Care | Payer: Commercial Managed Care - PPO | Attending: General Surgery | Admitting: General Surgery

## 2022-07-06 ENCOUNTER — Other Ambulatory Visit: Payer: Self-pay

## 2022-07-06 ENCOUNTER — Encounter (HOSPITAL_BASED_OUTPATIENT_CLINIC_OR_DEPARTMENT_OTHER): Payer: Self-pay

## 2022-07-06 DIAGNOSIS — Z6841 Body Mass Index (BMI) 40.0 and over, adult: Secondary | ICD-10-CM | POA: Diagnosis not present

## 2022-07-06 DIAGNOSIS — F419 Anxiety disorder, unspecified: Secondary | ICD-10-CM | POA: Diagnosis present

## 2022-07-06 DIAGNOSIS — J45909 Unspecified asthma, uncomplicated: Secondary | ICD-10-CM | POA: Diagnosis present

## 2022-07-06 DIAGNOSIS — Z79899 Other long term (current) drug therapy: Secondary | ICD-10-CM

## 2022-07-06 DIAGNOSIS — Z808 Family history of malignant neoplasm of other organs or systems: Secondary | ICD-10-CM

## 2022-07-06 DIAGNOSIS — K3533 Acute appendicitis with perforation and localized peritonitis, with abscess: Principal | ICD-10-CM | POA: Diagnosis present

## 2022-07-06 DIAGNOSIS — Z98891 History of uterine scar from previous surgery: Secondary | ICD-10-CM | POA: Diagnosis not present

## 2022-07-06 DIAGNOSIS — K3532 Acute appendicitis with perforation and localized peritonitis, without abscess: Secondary | ICD-10-CM | POA: Diagnosis not present

## 2022-07-06 DIAGNOSIS — Z8249 Family history of ischemic heart disease and other diseases of the circulatory system: Secondary | ICD-10-CM | POA: Diagnosis not present

## 2022-07-06 DIAGNOSIS — Z823 Family history of stroke: Secondary | ICD-10-CM | POA: Diagnosis not present

## 2022-07-06 DIAGNOSIS — E669 Obesity, unspecified: Secondary | ICD-10-CM | POA: Diagnosis present

## 2022-07-06 LAB — URINALYSIS, ROUTINE W REFLEX MICROSCOPIC
Bilirubin Urine: NEGATIVE
Glucose, UA: NEGATIVE mg/dL
Hgb urine dipstick: NEGATIVE
Ketones, ur: NEGATIVE mg/dL
Leukocytes,Ua: NEGATIVE
Nitrite: NEGATIVE
Protein, ur: NEGATIVE mg/dL
Specific Gravity, Urine: 1.007 (ref 1.005–1.030)
pH: 6.5 (ref 5.0–8.0)

## 2022-07-06 LAB — COMPREHENSIVE METABOLIC PANEL
ALT: 8 U/L (ref 0–44)
AST: 13 U/L — ABNORMAL LOW (ref 15–41)
Albumin: 4.5 g/dL (ref 3.5–5.0)
Alkaline Phosphatase: 80 U/L (ref 38–126)
Anion gap: 10 (ref 5–15)
BUN: 9 mg/dL (ref 6–20)
CO2: 27 mmol/L (ref 22–32)
Calcium: 8.9 mg/dL (ref 8.9–10.3)
Chloride: 102 mmol/L (ref 98–111)
Creatinine, Ser: 0.74 mg/dL (ref 0.44–1.00)
GFR, Estimated: >60 mL/min (ref >60)
Glucose, Bld: 100 mg/dL — ABNORMAL HIGH (ref 70–99)
Potassium: 4 mmol/L (ref 3.5–5.1)
Sodium: 139 mmol/L (ref 135–145)
Total Bilirubin: 0.5 mg/dL (ref 0.3–1.2)
Total Protein: 8 g/dL (ref 6.5–8.1)

## 2022-07-06 LAB — CBC
HCT: 38.1 % (ref 36.0–46.0)
Hemoglobin: 12.5 g/dL (ref 12.0–15.0)
MCH: 27.2 pg (ref 26.0–34.0)
MCHC: 32.8 g/dL (ref 30.0–36.0)
MCV: 82.8 fL (ref 80.0–100.0)
Platelets: 394 10*3/uL (ref 150–400)
RBC: 4.6 MIL/uL (ref 3.87–5.11)
RDW: 12.8 % (ref 11.5–15.5)
WBC: 11 10*3/uL — ABNORMAL HIGH (ref 4.0–10.5)
nRBC: 0 % (ref 0.0–0.2)

## 2022-07-06 LAB — PREGNANCY, URINE: Preg Test, Ur: NEGATIVE

## 2022-07-06 LAB — LIPASE, BLOOD: Lipase: 20 U/L (ref 11–51)

## 2022-07-06 MED ORDER — SODIUM CHLORIDE 0.45 % IV SOLN: INTRAVENOUS | Status: DC

## 2022-07-06 MED ORDER — DIPHENHYDRAMINE HCL 25 MG PO CAPS: 25.0000 mg | ORAL_CAPSULE | Freq: Four times a day (QID) | ORAL | Status: DC | PRN

## 2022-07-06 MED ORDER — OXYCODONE HCL 5 MG PO TABS
5.0000 mg | ORAL_TABLET | ORAL | Status: DC | PRN
  Administered 2022-07-06 – 2022-07-07 (×2): 10 mg via ORAL
  Filled 2022-07-06 (×2): qty 2

## 2022-07-06 MED ORDER — PIPERACILLIN-TAZOBACTAM 3.375 G IVPB
3.3750 g | Freq: Three times a day (TID) | INTRAVENOUS | Status: DC
  Administered 2022-07-07 – 2022-07-11 (×13): 3.375 g via INTRAVENOUS
  Filled 2022-07-06 (×14): qty 50

## 2022-07-06 MED ORDER — POTASSIUM CHLORIDE IN NACL 20-0.45 MEQ/L-% IV SOLN
INTRAVENOUS | Status: DC
  Filled 2022-07-06 (×9): qty 1000

## 2022-07-06 MED ORDER — PANTOPRAZOLE SODIUM 40 MG IV SOLR
40.0000 mg | Freq: Every day | INTRAVENOUS | Status: DC
  Administered 2022-07-07 – 2022-07-09 (×4): 40 mg via INTRAVENOUS
  Filled 2022-07-06 (×4): qty 10

## 2022-07-06 MED ORDER — ACETAMINOPHEN 650 MG RE SUPP: 650.0000 mg | Freq: Four times a day (QID) | RECTAL | Status: DC | PRN

## 2022-07-06 MED ORDER — VALACYCLOVIR HCL 500 MG PO TABS
500.0000 mg | ORAL_TABLET | Freq: Every day | ORAL | Status: DC
  Administered 2022-07-07 – 2022-07-11 (×5): 500 mg via ORAL
  Filled 2022-07-06 (×6): qty 1

## 2022-07-06 MED ORDER — LORATADINE 10 MG PO TABS
10.0000 mg | ORAL_TABLET | Freq: Every evening | ORAL | Status: DC
  Administered 2022-07-07 – 2022-07-10 (×4): 10 mg via ORAL
  Filled 2022-07-06 (×4): qty 1

## 2022-07-06 MED ORDER — ACETAMINOPHEN 325 MG PO TABS
650.0000 mg | ORAL_TABLET | Freq: Four times a day (QID) | ORAL | Status: DC | PRN
  Administered 2022-07-08 – 2022-07-09 (×2): 650 mg via ORAL
  Filled 2022-07-06 (×2): qty 2

## 2022-07-06 MED ORDER — DIPHENHYDRAMINE HCL 50 MG/ML IJ SOLN: 25.0000 mg | Freq: Four times a day (QID) | INTRAMUSCULAR | Status: DC | PRN

## 2022-07-06 MED ORDER — ONDANSETRON HCL 4 MG/2ML IJ SOLN: 4.0000 mg | Freq: Four times a day (QID) | INTRAMUSCULAR | Status: DC | PRN

## 2022-07-06 MED ORDER — ONDANSETRON 4 MG PO TBDP: 4.0000 mg | ORAL_TABLET | Freq: Four times a day (QID) | ORAL | Status: DC | PRN

## 2022-07-06 MED ORDER — IOHEXOL 300 MG/ML  SOLN
100.0000 mL | Freq: Once | INTRAMUSCULAR | Status: AC | PRN
  Administered 2022-07-06: 100 mL via INTRAVENOUS

## 2022-07-06 MED ORDER — PIPERACILLIN-TAZOBACTAM 3.375 G IVPB 30 MIN
3.3750 g | Freq: Once | INTRAVENOUS | Status: AC
Start: 2022-07-06 — End: 2022-07-06
  Administered 2022-07-06: 3.375 g via INTRAVENOUS
  Filled 2022-07-06: qty 50

## 2022-07-06 MED ORDER — MORPHINE SULFATE (PF) 4 MG/ML IV SOLN: 4.0000 mg | INTRAVENOUS | Status: DC | PRN

## 2022-07-06 MED ORDER — SODIUM CHLORIDE 0.9 % IV SOLN: INTRAVENOUS | Status: DC

## 2022-07-06 NOTE — ED Provider Notes (Signed)
[facility] EMERGENCY DEPT Provider Note   CSN: [id number] Arrival date & time: 07/06/22  1424     History  Chief Complaint  Patient presents with   Abdominal Pain    Monica [name] is a 46 y.o. female.  She has no significant past medical history.  She is complaining of 4 days of right lower quadrant suprapubic abdominal pain.  Initially was sharp and stabbing now more low-grade although still has exacerbations of sharp pain.  Rates it as 5 out of 10 currently 8 out of 10 its maximum.  Associated with nausea and anorexia.  Has felt chilled but no documented fever.  Just finished her period.  Thought was constipated so took a laxative without significant change.  Prior history of C-section.  No urinary symptoms no cough shortness of breath vaginal bleeding or discharge.  The history is provided by the patient.  Abdominal Pain Pain location:  RLQ and suprapubic Pain quality: aching, pressure and stabbing   Pain radiates to:  Does not radiate Pain severity:  Moderate Onset quality:  Gradual Duration:  4 days Timing:  Constant Progression:  Waxing and waning Chronicity:  New Context: not sick contacts, not suspicious food intake and not trauma   Relieved by:  Nothing Worsened by:  Movement and position changes Ineffective treatments:  Bowel activity Associated symptoms: anorexia, chills and nausea   Associated symptoms: no chest pain, no cough, no diarrhea, no dysuria, no fever, no hematemesis, no hematochezia, no hematuria, no shortness of breath, no sore throat, no vaginal bleeding, no vaginal discharge and no vomiting        Home Medications Prior to Admission medications   Medication Sig Start Date End Date Taking? Authorizing Provider  cholecalciferol (VITAMIN D3) 25 MCG (1000 UNIT) tablet Take 1,000 Units by mouth at bedtime.    [provider]  ibuprofen (ADVIL) 200 MG tablet Take 800 mg by mouth at bedtime.    [provider]   levocetirizine (XYZAL) 5 MG tablet Take 5 mg by mouth every evening.    [provider]  Multiple Vitamins-Minerals (MULTIVITAMIN WITH MINERALS) tablet Take 1 tablet by mouth daily.    [provider]  naproxen sodium (ALEVE) 220 MG tablet Take 220 mg by mouth daily as needed (pain).    [provider]  valACYclovir (VALTREX) 500 MG tablet Take 500 mg by mouth daily. 12/24/21   [provider]      Allergies    Patient has no known allergies.    Review of Systems   Review of Systems  Constitutional:  Positive for chills. Negative for fever.  HENT:  Negative for sore throat.   Respiratory:  Negative for cough and shortness of breath.   Cardiovascular:  Negative for chest pain.  Gastrointestinal:  Positive for abdominal pain, anorexia and nausea. Negative for diarrhea, hematemesis, hematochezia and vomiting.  Genitourinary:  Negative for dysuria, hematuria, vaginal bleeding and vaginal discharge.  Musculoskeletal:  Negative for back pain.  Skin:  Negative for rash.    Physical Exam Updated Vital Signs BP 133/64 (BP Location: Right Arm)   Pulse 97   Temp 98.3 F (36.8 C)   Resp 18   Ht 5\' 7"  (1.702 m)   Wt 129.2 kg   SpO2 100%   BMI 44.61 kg/m  Physical Exam Vitals and nursing note reviewed.  Constitutional:      General: She is not in acute distress.    Appearance: Normal appearance. She is well-developed.  HENT:     Head: Normocephalic and atraumatic.  Eyes:     Conjunctiva/sclera: Conjunctivae normal.  Cardiovascular:     Rate and Rhythm: Normal rate and regular rhythm.     Heart sounds: No murmur heard. Pulmonary:     Effort: Pulmonary effort is normal. No respiratory distress.     Breath sounds: Normal breath sounds.  Abdominal:     Palpations: Abdomen is soft.     Tenderness: There is abdominal tenderness in the right lower quadrant and suprapubic area. There is no guarding or rebound.  Musculoskeletal:        General: Normal  range of motion.     Cervical back: Neck supple.     Right lower leg: No edema.     Left lower leg: No edema.  Skin:    General: Skin is warm and dry.     Capillary Refill: Capillary refill takes less than 2 seconds.  Neurological:     General: No focal deficit present.     Mental Status: She is alert.     ED Results / Procedures / Treatments   Labs (all labs ordered are listed, but only abnormal results are displayed) Labs Reviewed  COMPREHENSIVE METABOLIC PANEL - Abnormal; Notable for the following components:      Result Value   Glucose, Bld 100 (*)    AST 13 (*)    All other components within normal limits  CBC - Abnormal; Notable for the following components:   WBC 11.0 (*)    All other components within normal limits  BASIC METABOLIC PANEL - Abnormal; Notable for the following components:   Calcium 8.6 (*)    All other components within normal limits  CBC - Abnormal; Notable for the following components:   Hemoglobin 11.9 (*)    All other components within normal limits  LIPASE, BLOOD  URINALYSIS, ROUTINE W REFLEX MICROSCOPIC  PREGNANCY, URINE  HIV ANTIBODY (ROUTINE TESTING W REFLEX)    EKG None  Radiology US PELVIC COMPLETE W TRANSVAGINAL AND TORSION R/O  Result Date: 07/06/2022 CLINICAL DATA:  Right lower quadrant and suprapubic pelvic pain for 3-4 days C-section in 2021 EXAM: TRANSABDOMINAL AND TRANSVAGINAL ULTRASOUND OF PELVIS TECHNIQUE: Both transabdominal and transvaginal ultrasound examinations of the pelvis were performed. Transabdominal technique was performed for global imaging of the pelvis including uterus, ovaries, adnexal regions, and pelvic cul-de-sac. It was necessary to proceed with endovaginal exam following the transabdominal exam to visualize the adnexa. COMPARISON:  Same day CT abdomen and pelvis FINDINGS: Uterus Measurements: 10.3 x 4.1 x 4.8 cm = volume: 105.9 mL. No fibroids or other mass visualized. Endometrium Thickness: 5.1 mm.  No focal  abnormality visualized. Right ovary Measurements: 3.0 x 2.2 x 2.6 cm = volume: 8.7 mL. 2.5 cm dominant follicle. Left ovary Measurements: Not well visualized due to overlying bowel and body habitus Other findings Multiloculated inflammatory process in the right lower quadrant likely related to perforated appendicitis though tubo-ovarian abscess is difficult to exclude. Small amount of fluid in the cervix. Pulsed Doppler evaluation demonstrates normal low-resistance arterial and venous waveforms in the right ovary. The left ovary was not well visualized. IMPRESSION: Multiloculated inflammatory process about the right lower quadrant/adnexa likely related to perforated appendicitis as seen on CT earlier today. Tubo-ovarian abscesses thought less likely though difficult to exclude. Electronically Signed   By: Minerva Fester M.D.   On: 07/06/2022 18:46   CT Abdomen Pelvis W Contrast  Result Date: 07/06/2022 CLINICAL DATA:  RLQ abdominal  pain (Age >= 14y) EXAM: CT ABDOMEN AND PELVIS WITH CONTRAST TECHNIQUE: Multidetector CT imaging of the abdomen and pelvis was performed using the standard protocol following bolus administration of intravenous contrast. RADIATION DOSE REDUCTION: This exam was performed according to the departmental dose-optimization program which includes automated exposure control, adjustment of the mA and/or kV according to patient size and/or use of iterative reconstruction technique. CONTRAST:  14mL OMNIPAQUE IOHEXOL 300 MG/ML  SOLN COMPARISON:  None Available. FINDINGS: Lower chest: No acute abnormality. Hepatobiliary: No focal liver abnormality. Calcified gallstone noted within the gallbladder lumen. No gallbladder wall thickening or pericholecystic fluid. No biliary dilatation. Pancreas: No focal lesion. Normal pancreatic contour. No surrounding inflammatory changes. No main pancreatic ductal dilatation. Spleen: Normal in size without focal abnormality. Adrenals/Urinary Tract: There is a 1.3  cm left adrenal gland nodule with a density of 29 Hounsfield units. No right adrenal gland nodule. Bilateral kidneys enhance symmetrically. No hydronephrosis. No hydroureter. The urinary bladder is unremarkable. Stomach/Bowel: Stomach is within normal limits. No evidence of bowel wall thickening or dilatation. No normal appendix identified. Colonic diverticulosis. There is a 5.4 x 2.9 cm multiloculated thick-walled and peripherally enhancing cystic lesion within the expected region of the appendix/right adnexa. Vascular/Lymphatic: No abdominal aorta or iliac aneurysm. No abdominal, pelvic, or inguinal lymphadenopathy. Reproductive: Uterus and left adnexa are unremarkable. No right ovary identified. Other: No intraperitoneal free fluid. No intraperitoneal free gas. No organized fluid collection. Musculoskeletal: Tiny fat containing umbilical hernia. No suspicious lytic or blastic osseous lesions. No acute displaced fracture. Multilevel degenerative changes of the spine. IMPRESSION: 1. Likely perforated acute appendicitis with a like 5.4 x 2.9 cm multiloculated abscess formation in the expected region of the appendix/right adnexa. Differential diagnosis includes tubo-ovarian abscess. 2. Cholelithiasis with no acute cholecystitis. 3. Colonic diverticulosis with no acute diverticulitis. 4. A 1.3 cm left adrenal mass, probable benign adenoma. Recommend 1 year follow up adrenal washout CT. If stable for > 1 year, no further f/u imaging. JACR 2017 Aug; 14(8):1038-44, JCAT 2016 Mar-Apr; 40(2):194-200, Urol J 2006 Spring; 3(2):71-4. Electronically Signed   By: Iven Finn M.D.   On: 07/06/2022 16:05    Procedures Procedures    Medications Ordered in ED Medications  valACYclovir (VALTREX) tablet 500 mg (has no administration in time range)  loratadine (CLARITIN) tablet 10 mg (has no administration in time range)  piperacillin-tazobactam (ZOSYN) IVPB 3.375 g (3.375 g Intravenous New Bag/Given 07/07/22 0952)   acetaminophen (TYLENOL) tablet 650 mg (has no administration in time range)    Or  acetaminophen (TYLENOL) suppository 650 mg (has no administration in time range)  oxyCODONE (Oxy IR/ROXICODONE) immediate release tablet 5-10 mg (10 mg Oral Given 07/06/22 2351)  morphine (PF) 4 MG/ML injection 4 mg (has no administration in time range)  diphenhydrAMINE (BENADRYL) capsule 25 mg (has no administration in time range)    Or  diphenhydrAMINE (BENADRYL) injection 25 mg (has no administration in time range)  ondansetron (ZOFRAN-ODT) disintegrating tablet 4 mg (has no administration in time range)    Or  ondansetron (ZOFRAN) injection 4 mg (has no administration in time range)  pantoprazole (PROTONIX) injection 40 mg (40 mg Intravenous Given 07/07/22 0205)  0.45 % NaCl with KCl 20 mEq / L infusion ( Intravenous New Bag/Given 07/06/22 2353)  enoxaparin (LOVENOX) injection 40 mg (has no administration in time range)  iohexol (OMNIPAQUE) 300 MG/ML solution 100 mL (100 mLs Intravenous Contrast Given 07/06/22 1538)  piperacillin-tazobactam (ZOSYN) IVPB 3.375 g (0 g Intravenous Stopped 07/06/22 1734)  ED Course/ Medical Decision Making/ A&P Clinical Course as of 07/07/22 0951  Thu Jul 06, 2022  1609 Coming back with likely perforated appendicitis.  Ordered antibiotics and placed consult called to general surgery.  Updated patient [MB]  1619 Discussed with Saverio Danker general surgery.  She is going to check with her attending Dr. Donne Hazel and call me back with the plan [MB]  [place] surgery recommending pelvic ultrasound.  Reviewed with patient. [MB]  [id number] Discussed with Dr. Grandville Silos general surgery who asked that the patient be admitted to his service over at [facility].  Anticipated for MedSurg admission [MB]    Clinical Course User Index [MB] Hayden Rasmussen, MD                           Medical Decision Making Amount and/or Complexity of Data Reviewed Labs: ordered. Radiology:  ordered.  Risk Prescription drug management. Decision regarding hospitalization.  This patient complains of right lower quadrant abdominal pain and anorexia; this involves an extensive number of treatment Options and is a complaint that carries with it a high risk of complications and morbidity. The differential includes appendicitis, diverticulitis, PID, TOA, constipation, UTI  I ordered, reviewed and interpreted labs, which included CBC with mildly elevated white count normal hemoglobin, chemistries and LFTs fairly unremarkable, urinalysis negative, pregnancy negative I ordered medication IV fluids IV antibiotics and reviewed PMP when indicated. I ordered imaging studies which included CT abdomen and pelvis and pelvic ultrasound and I independently    visualized and interpreted imaging which showed inflammatory changes right lower quadrant likely perforated appendicitis Additional history obtained from patient's husband Previous records obtained and reviewed in epic no recent admissions.  Patient has seen general surgery recently for anticipated [place]ri surgery I consulted general surgery Dr. Durward Fortes and Dr. Grandville Silos and discussed lab and imaging findings and discussed disposition.  Cardiac monitoring reviewed, normal sinus rhythm Social determinants considered, no significant barriers Critical Interventions: None  After the interventions stated above, I reevaluated the patient and found patient symptoms to be improved Admission and further testing considered, she will need to be admitted to the hospital for likely interventional drainage of her abscess and further management of perforated appendicitis.  Patient in agreement with plan.          Final Clinical Impression(s) / ED Diagnoses Final diagnoses:  Perforated appendicitis    Rx / DC Orders ED Discharge Orders     None         Hayden Rasmussen, MD 07/07/22 [phone]

## 2022-07-06 NOTE — H&P (Signed)
Monica [name] is an 46 y.o. female.   Chief Complaint: RLQ pain HPI: 46yo F known to our practice because she is scheduled for bariatric surgery with Dr. Dossie Der next month developed right lower quadrant abdominal pain this past Monday evening while she was at a pre-bariatric surgery class.  She thought it was constipation.  She took 2 doses of MiraLAX the following day without relief.  The pain persisted and she took some magnesium citrate.  She said she had multiple bowel movements but the pain did not get better.  She did a televisit with her PCP and he recommended she be evaluated in the emergency department.  [facility] where white blood cell count was 11,000.  CT scan of the abdomen pelvis demonstrated a 5.4 x 2.9 cm abscess in her right lower quadrant due to perforated appendicitis versus TOA.  She underwent further evaluation with an ultrasound which showed findings more consistent with perforated appendicitis.  I accepted her in transfer for surgical care at [facility].  Past Medical History:  Diagnosis Date   AMA (advanced maternal age) multigravida 35+    Anxiety    Asthma    Depression    had PP depression   HSV (herpes simplex virus) anogenital infection    Hx of HELLP syndrome, currently pregnant    PONV (postoperative nausea and vomiting)    Vaginal Pap smear, abnormal     Past Surgical History:  Procedure Laterality Date   ADENOIDECTOMY     BIOPSY  05/31/2022   Procedure: BIOPSY;  Surgeon: Quentin Ore, MD;  Location: [facility];  Service: General;;   bone spur removal     CESAREAN SECTION  2013   CESAREAN SECTION N/A 10/20/2017   Procedure: REPEAT CESAREAN SECTION WITH SUPRA CLITORAL SKIN TAG REMOVAL;  Surgeon: Zelphia Cairo, MD;  Location: [facility] BIRTHING SUITES;  Service: Obstetrics;  Laterality: N/A;  Repeat edc 11/05/17 nkda    CESAREAN SECTION WITH BILATERAL TUBAL LIGATION N/A 03/15/2020   Procedure: CESAREAN SECTION WITH  BILATERAL TUBAL LIGATION;  Surgeon: Ranae Pila, MD;  Location: [facility] LD ORS;  Service: Obstetrics;  Laterality: N/A;   COLPOSCOPY     ESOPHAGOGASTRODUODENOSCOPY N/A 05/31/2022   Procedure: ESOPHAGOGASTRODUODENOSCOPY (EGD);  Surgeon: Quentin Ore, MD;  Location: [facility];  Service: General;  Laterality: N/A;   tonsil suregery     additional tissue removal   TONSILLECTOMY      Family History  Problem Relation Age of Onset   Hypertension Mother    Hashimoto's thyroiditis Mother    Hypertension Father    Skin cancer Paternal Grandfather    Stroke Paternal Grandfather    Allergic rhinitis Neg Hx    Angioedema Neg Hx    Asthma Neg Hx    Atopy Neg Hx    Eczema Neg Hx    Immunodeficiency Neg Hx    Urticaria Neg Hx    Social History:  reports that she has never smoked. She has never used smokeless tobacco. She reports that she does not drink alcohol and does not use drugs.  Allergies: No Known Allergies  Medications Prior to Admission  Medication Sig Dispense Refill   cholecalciferol (VITAMIN D3) 25 MCG (1000 UNIT) tablet Take 1,000 Units by mouth at bedtime.     ibuprofen (ADVIL) 200 MG tablet Take 800 mg by mouth at bedtime.     levocetirizine (XYZAL) 5 MG tablet Take 5 mg by mouth every evening.  Multiple Vitamins-Minerals (MULTIVITAMIN WITH MINERALS) tablet Take 1 tablet by mouth daily.     naproxen sodium (ALEVE) 220 MG tablet Take 220 mg by mouth daily as needed (pain).     valACYclovir (VALTREX) 500 MG tablet Take 500 mg by mouth daily.      Results for orders placed or performed during the hospital encounter of 07/06/22 (from the past 48 hour(s))  Lipase, blood     Status: None   Collection Time: 07/06/22  2:45 PM  Result Value Ref Range   Lipase 20 11 - 51 U/L    Comment: Performed at [facility], [address] [place], [place] [address]  Comprehensive metabolic panel     Status: Abnormal   Collection Time: 07/06/22  2:45 PM   Result Value Ref Range   Sodium 139 135 - 145 mmol/L   Potassium 4.0 3.5 - 5.1 mmol/L   Chloride 102 98 - 111 mmol/L   CO2 27 22 - 32 mmol/L   Glucose, Bld 100 (H) 70 - 99 mg/dL    Comment: Glucose reference range applies only to samples taken after fasting for at least 8 hours.   BUN 9 6 - 20 mg/dL   Creatinine, Ser 6.27 0.44 - 1.00 mg/dL   Calcium 8.9 8.9 - 03.5 mg/dL   Total Protein 8.0 6.5 - 8.1 g/dL   Albumin 4.5 3.5 - 5.0 g/dL   AST 13 (L) 15 - 41 U/L   ALT 8 0 - 44 U/L   Alkaline Phosphatase 80 38 - 126 U/L   Total Bilirubin 0.5 0.3 - 1.2 mg/dL   GFR, Estimated >00 >93 mL/min    Comment: (NOTE) Calculated using the CKD-EPI Creatinine Equation (2021)    Anion gap 10 5 - 15    Comment: Performed at [facility], [address] [place], [place] [address]  CBC     Status: Abnormal   Collection Time: 07/06/22  2:45 PM  Result Value Ref Range   WBC 11.0 (H) 4.0 - 10.5 K/uL   RBC 4.60 3.87 - 5.11 MIL/uL   Hemoglobin 12.5 12.0 - 15.0 g/dL   HCT 93.7 16.9 - 67.8 %   MCV 82.8 80.0 - 100.0 fL   MCH 27.2 26.0 - 34.0 pg   MCHC 32.8 30.0 - 36.0 g/dL   RDW 93.8 10.1 - 75.1 %   Platelets 394 150 - 400 K/uL   nRBC 0.0 0.0 - 0.2 %    Comment: Performed at [facility], [address], [place], [place] [address]  Urinalysis, Routine w reflex microscopic Urine, Clean Catch     Status: None   Collection Time: 07/06/22  2:45 PM  Result Value Ref Range   Color, Urine YELLOW YELLOW   APPearance CLEAR CLEAR   Specific Gravity, Urine 1.007 1.005 - 1.030   pH 6.5 5.0 - 8.0   Glucose, UA NEGATIVE NEGATIVE mg/dL   Hgb urine dipstick NEGATIVE NEGATIVE   Bilirubin Urine NEGATIVE NEGATIVE   Ketones, ur NEGATIVE NEGATIVE mg/dL   Protein, ur NEGATIVE NEGATIVE mg/dL   Nitrite NEGATIVE NEGATIVE   Leukocytes,Ua NEGATIVE NEGATIVE    Comment: Performed at [facility], [address] [place], [place] [address]  Pregnancy, urine      Status: None   Collection Time: 07/06/22  2:45 PM  Result Value Ref Range   Preg Test, Ur NEGATIVE NEGATIVE    Comment:        THE SENSITIVITY OF THIS METHODOLOGY IS >  20 mIU/mL. Performed at [facility], [address], [place], [place] [address]    US PELVIC COMPLETE W TRANSVAGINAL AND TORSION R/O  Result Date: 07/06/2022 CLINICAL DATA:  Right lower quadrant and suprapubic pelvic pain for 3-4 days C-section in 2021 EXAM: TRANSABDOMINAL AND TRANSVAGINAL ULTRASOUND OF PELVIS TECHNIQUE: Both transabdominal and transvaginal ultrasound examinations of the pelvis were performed. Transabdominal technique was performed for global imaging of the pelvis including uterus, ovaries, adnexal regions, and pelvic cul-de-sac. It was necessary to proceed with endovaginal exam following the transabdominal exam to visualize the adnexa. COMPARISON:  Same day CT abdomen and pelvis FINDINGS: Uterus Measurements: 10.3 x 4.1 x 4.8 cm = volume: 105.9 mL. No fibroids or other mass visualized. Endometrium Thickness: 5.1 mm.  No focal abnormality visualized. Right ovary Measurements: 3.0 x 2.2 x 2.6 cm = volume: 8.7 mL. 2.5 cm dominant follicle. Left ovary Measurements: Not well visualized due to overlying bowel and body habitus Other findings Multiloculated inflammatory process in the right lower quadrant likely related to perforated appendicitis though tubo-ovarian abscess is difficult to exclude. Small amount of fluid in the cervix. Pulsed Doppler evaluation demonstrates normal low-resistance arterial and venous waveforms in the right ovary. The left ovary was not well visualized. IMPRESSION: Multiloculated inflammatory process about the right lower quadrant/adnexa likely related to perforated appendicitis as seen on CT earlier today. Tubo-ovarian abscesses thought less likely though difficult to exclude. Electronically Signed   By: Minerva Fester M.D.   On: 07/06/2022 18:46   CT Abdomen Pelvis W  Contrast  Result Date: 07/06/2022 CLINICAL DATA:  RLQ abdominal pain (Age >= 14y) EXAM: CT ABDOMEN AND PELVIS WITH CONTRAST TECHNIQUE: Multidetector CT imaging of the abdomen and pelvis was performed using the standard protocol following bolus administration of intravenous contrast. RADIATION DOSE REDUCTION: This exam was performed according to the departmental dose-optimization program which includes automated exposure control, adjustment of the mA and/or kV according to patient size and/or use of iterative reconstruction technique. CONTRAST:  OMNIPAQUE IOHEXOL 300 MG/ML  SOLN COMPARISON:  None Available. FINDINGS: Lower chest: No acute abnormality. Hepatobiliary: No focal liver abnormality. Calcified gallstone noted within the gallbladder lumen. No gallbladder wall thickening or pericholecystic fluid. No biliary dilatation. Pancreas: No focal lesion. Normal pancreatic contour. No surrounding inflammatory changes. No main pancreatic ductal dilatation. Spleen: Normal in size without focal abnormality. Adrenals/Urinary Tract: There is a 1.3 cm left adrenal gland nodule with a density of 29 Hounsfield units. No right adrenal gland nodule. Bilateral kidneys enhance symmetrically. No hydronephrosis. No hydroureter. The urinary bladder is unremarkable. Stomach/Bowel: Stomach is within normal limits. No evidence of bowel wall thickening or dilatation. No normal appendix identified. Colonic diverticulosis. There is a 5.4 x 2.9 cm multiloculated thick-walled and peripherally enhancing cystic lesion within the expected region of the appendix/right adnexa. Vascular/Lymphatic: No abdominal aorta or iliac aneurysm. No abdominal, pelvic, or inguinal lymphadenopathy. Reproductive: Uterus and left adnexa are unremarkable. No right ovary identified. Other: No intraperitoneal free fluid. No intraperitoneal free gas. No organized fluid collection. Musculoskeletal: Tiny fat containing umbilical hernia. No suspicious lytic or  blastic osseous lesions. No acute displaced fracture. Multilevel degenerative changes of the spine. IMPRESSION: 1. Likely perforated acute appendicitis with a like 5.4 x 2.9 cm multiloculated abscess formation in the expected region of the appendix/right adnexa. Differential diagnosis includes tubo-ovarian abscess. 2. Cholelithiasis with no acute cholecystitis. 3. Colonic diverticulosis with no acute diverticulitis. 4. A 1.3 cm left adrenal mass, probable benign adenoma. Recommend 1 year follow up  adrenal washout CT. If stable for > 1 year, no further f/u imaging. JACR 2017 Aug; 14(8):1038-44, JCAT 2016 Mar-Apr; 40(2):194-200, Urol J 2006 Spring; 3(2):71-4. Electronically Signed   By: Tish Frederickson M.D.   On: 07/06/2022 16:05    Review of Systems  Constitutional:  Positive for appetite change.  HENT: Negative.    Eyes: Negative.   Respiratory: Negative.    Cardiovascular: Negative.   Gastrointestinal:  Positive for abdominal pain, constipation and nausea. Negative for blood in stool.  Endocrine: Negative.   Genitourinary: Negative.   Musculoskeletal: Negative.   Skin: Negative.   Allergic/Immunologic: Negative.   Neurological: Negative.   Hematological: Negative.   Psychiatric/Behavioral: Negative.      Blood pressure (!) 142/90, pulse 92, temperature 98.6 F (37 C), temperature source Oral, resp. rate 16, height 5\' 7"  (1.702 m), weight 129.2 kg, SpO2 97 %. Physical Exam Constitutional:      General: She is not in acute distress.    Appearance: She is well-developed.  HENT:     Head: Normocephalic.  Eyes:     Extraocular Movements: Extraocular movements intact.     Pupils: Pupils are equal, round, and reactive to light.  Cardiovascular:     Rate and Rhythm: Normal rate and regular rhythm.     Heart sounds: Normal heart sounds.  Pulmonary:     Effort: Pulmonary effort is normal.     Breath sounds: Normal breath sounds.  Abdominal:     General: Abdomen is flat. Bowel sounds are  decreased.     Palpations: Abdomen is soft.     Tenderness: There is abdominal tenderness in the right lower quadrant. There is no guarding or rebound.     Hernia: No hernia is present.  Skin:    General: Skin is warm and dry.  Neurological:     Mental Status: She is alert and oriented to person, place, and time.  Psychiatric:        Mood and Affect: Mood normal.      Assessment/Plan Perforated appendicitis with abscess -IV Zosyn, will ask interventional radiology to evaluate for placement of a drain.  I discussed the plan with her in detail.  We will also notify Dr. .  Admit to inpatient.  Dossie Der, MD 07/06/2022, 10:58 PM

## 2022-07-06 NOTE — ED Triage Notes (Signed)
Patient here POV from Home.  Endorses RLQ and Suprapubic ABD Pain that began 3-4 Days. Worsened Since, Especially yesterday.   Some Constipation. Less PO Intake. Moderate Nausea. No Emesis. No Known Fevers.   NAD Noted during Triage. A&Ox4. GCS 15. Ambulatory.

## 2022-07-07 LAB — BASIC METABOLIC PANEL
Anion gap: 7 (ref 5–15)
BUN: 7 mg/dL (ref 6–20)
CO2: 26 mmol/L (ref 22–32)
Calcium: 8.6 mg/dL — ABNORMAL LOW (ref 8.9–10.3)
Chloride: 105 mmol/L (ref 98–111)
Creatinine, Ser: 0.74 mg/dL (ref 0.44–1.00)
GFR, Estimated: >60 mL/min (ref >60)
Glucose, Bld: 99 mg/dL (ref 70–99)
Potassium: 3.7 mmol/L (ref 3.5–5.1)
Sodium: 138 mmol/L (ref 135–145)

## 2022-07-07 LAB — CBC
HCT: 36.8 % (ref 36.0–46.0)
Hemoglobin: 11.9 g/dL — ABNORMAL LOW (ref 12.0–15.0)
MCH: 27.5 pg (ref 26.0–34.0)
MCHC: 32.3 g/dL (ref 30.0–36.0)
MCV: 85 fL (ref 80.0–100.0)
Platelets: 334 10*3/uL (ref 150–400)
RBC: 4.33 MIL/uL (ref 3.87–5.11)
RDW: 12.6 % (ref 11.5–15.5)
WBC: 9.3 10*3/uL (ref 4.0–10.5)
nRBC: 0 % (ref 0.0–0.2)

## 2022-07-07 LAB — HIV ANTIBODY (ROUTINE TESTING W REFLEX): HIV Screen 4th Generation wRfx: NONREACTIVE

## 2022-07-07 MED ORDER — ENOXAPARIN SODIUM 40 MG/0.4ML IJ SOSY: 40.0000 mg | PREFILLED_SYRINGE | INTRAMUSCULAR | Status: DC

## 2022-07-07 MED ORDER — ENOXAPARIN SODIUM 60 MG/0.6ML IJ SOSY
60.0000 mg | PREFILLED_SYRINGE | INTRAMUSCULAR | Status: DC
  Administered 2022-07-08 – 2022-07-11 (×3): 60 mg via SUBCUTANEOUS
  Filled 2022-07-07 (×4): qty 0.6

## 2022-07-07 NOTE — Progress Notes (Signed)
Pt is angry due to the fact that no one has come and talked to her about the plan of care for today. Reached out to IR and [facility] PA's for someone to speak to the patient. IR states that hospitalitis or PA's should talk to the patient because they have not "build a relationship" with the patient. Paged on call MD, he wanted [place] to explain to the patient that we will continue with conservative management. Charge RN has explained this to patient but she would like for MD or PA's to speak with her.

## 2022-07-07 NOTE — Progress Notes (Signed)
IR Procedure request intra abdominal abscess drain placement.   46 y.o. female inpatient. No significant medical history.. Presented to the ED at [facility] on 8.17.23 with RLQ X 4 days. Found to have perforated appendicitis. CT abd pelvis from 1978/04/15 reads Likely perforated acute appendicitis with a like 5.4 x 2.9 cm multiloculated abscess formation in the expected region of the appendix/right adnexa. Differential diagnosis includes tubo-ovarian abscess.  Case reviewed with IR Attending Dr. Donne Hazel. After review of images  the collection in question is not amenable to percutaneous access. Recommend medical management at this time. This was communicated to the Team. Should her condition change please rescan for possible changed and re-consult IR.

## 2022-07-07 NOTE — Progress Notes (Signed)
[facility] Progress Note     Subjective: CC-  Abdomen still sore but feeling better since admission. Denies n/v. Feels thirsty. WBC down 9.3, VSS  Objective: Vital signs in last 24 hours: Temp:  [98.1 F (36.7 C)-99 F (37.2 C)] 98.1 F (36.7 C) (08/18 0725) Pulse Rate:  [87-97] 87 (08/18 0725) Resp:  [16-18] 16 (08/18 0725) BP: (102-142)/(62-91) 111/68 (08/18 0725) SpO2:  [95 %-100 %] 98 % (08/18 0725) Weight:  [129.2 kg] 129.2 kg (08/17 1440) Last BM Date : 07/06/22  Intake/Output from previous day: 08/17 0701 - 08/18 0700 In: 50 [IV Piggyback:50] Out: -  Intake/Output this shift: No intake/output data recorded.  PE: Gen:  Alert, NAD, pleasant Abd: soft, nondistended, TTP RLQ without rebound or guarding  Lab Results:  Recent Labs    07/06/22 1445 07/07/22 0110  WBC 11.0* 9.3  HGB 12.5 11.9*  HCT 38.1 36.8  PLT 394 334   BMET Recent Labs    07/06/22 1445 07/07/22 0110  NA 139 138  K 4.0 3.7  CL 102 105  CO2 27 26  GLUCOSE 100* 99  BUN 9 7  CREATININE 0.74 0.74  CALCIUM 8.9 8.6*   PT/INR No results for input(s): "LABPROT", "INR" in the last 72 hours. CMP     Component Value Date/Time   NA 138 07/07/2022 0110   K 3.7 07/07/2022 0110   CL 105 07/07/2022 0110   CO2 26 07/07/2022 0110   GLUCOSE 99 07/07/2022 0110   BUN 7 07/07/2022 0110   CREATININE 0.74 07/07/2022 0110   CALCIUM 8.6 (L) 07/07/2022 0110   PROT 8.0 07/06/2022 1445   ALBUMIN 4.5 07/06/2022 1445   AST 13 (L) 07/06/2022 1445   ALT 8 07/06/2022 1445   ALKPHOS 80 07/06/2022 1445   BILITOT 0.5 07/06/2022 1445   GFRNONAA >60 07/07/2022 0110   GFRAA >60 03/15/2020 1818   Lipase     Component Value Date/Time   LIPASE 20 07/06/2022 1445       Studies/Results: US PELVIC COMPLETE W TRANSVAGINAL AND TORSION R/O  Result Date: 07/06/2022 CLINICAL DATA:  Right lower quadrant and suprapubic pelvic pain for 3-4 days C-section in 2021 EXAM: TRANSABDOMINAL AND  TRANSVAGINAL ULTRASOUND OF PELVIS TECHNIQUE: Both transabdominal and transvaginal ultrasound examinations of the pelvis were performed. Transabdominal technique was performed for global imaging of the pelvis including uterus, ovaries, adnexal regions, and pelvic cul-de-sac. It was necessary to proceed with endovaginal exam following the transabdominal exam to visualize the adnexa. COMPARISON:  Same day CT abdomen and pelvis FINDINGS: Uterus Measurements: 10.3 x 4.1 x 4.8 cm = volume: 105.9 mL. No fibroids or other mass visualized. Endometrium Thickness: 5.1 mm.  No focal abnormality visualized. Right ovary Measurements: 3.0 x 2.2 x 2.6 cm = volume: 8.7 mL. 2.5 cm dominant follicle. Left ovary Measurements: Not well visualized due to overlying bowel and body habitus Other findings Multiloculated inflammatory process in the right lower quadrant likely related to perforated appendicitis though tubo-ovarian abscess is difficult to exclude. Small amount of fluid in the cervix. Pulsed Doppler evaluation demonstrates normal low-resistance arterial and venous waveforms in the right ovary. The left ovary was not well visualized. IMPRESSION: Multiloculated inflammatory process about the right lower quadrant/adnexa likely related to perforated appendicitis as seen on CT earlier today. Tubo-ovarian abscesses thought less likely though difficult to exclude. Electronically Signed   By: Minerva Fester M.D.   On: 07/06/2022 18:46   CT Abdomen Pelvis W Contrast  Result Date: 07/06/2022 CLINICAL  DATA:  RLQ abdominal pain (Age >= 14y) EXAM: CT ABDOMEN AND PELVIS WITH CONTRAST TECHNIQUE: Multidetector CT imaging of the abdomen and pelvis was performed using the standard protocol following bolus administration of intravenous contrast. RADIATION DOSE REDUCTION: This exam was performed according to the departmental dose-optimization program which includes automated exposure control, adjustment of the mA and/or kV according to patient  size and/or use of iterative reconstruction technique. CONTRAST:  OMNIPAQUE IOHEXOL 300 MG/ML  SOLN COMPARISON:  None Available. FINDINGS: Lower chest: No acute abnormality. Hepatobiliary: No focal liver abnormality. Calcified gallstone noted within the gallbladder lumen. No gallbladder wall thickening or pericholecystic fluid. No biliary dilatation. Pancreas: No focal lesion. Normal pancreatic contour. No surrounding inflammatory changes. No main pancreatic ductal dilatation. Spleen: Normal in size without focal abnormality. Adrenals/Urinary Tract: There is a 1.3 cm left adrenal gland nodule with a density of 29 Hounsfield units. No right adrenal gland nodule. Bilateral kidneys enhance symmetrically. No hydronephrosis. No hydroureter. The urinary bladder is unremarkable. Stomach/Bowel: Stomach is within normal limits. No evidence of bowel wall thickening or dilatation. No normal appendix identified. Colonic diverticulosis. There is a 5.4 x 2.9 cm multiloculated thick-walled and peripherally enhancing cystic lesion within the expected region of the appendix/right adnexa. Vascular/Lymphatic: No abdominal aorta or iliac aneurysm. No abdominal, pelvic, or inguinal lymphadenopathy. Reproductive: Uterus and left adnexa are unremarkable. No right ovary identified. Other: No intraperitoneal free fluid. No intraperitoneal free gas. No organized fluid collection. Musculoskeletal: Tiny fat containing umbilical hernia. No suspicious lytic or blastic osseous lesions. No acute displaced fracture. Multilevel degenerative changes of the spine. IMPRESSION: 1. Likely perforated acute appendicitis with a like 5.4 x 2.9 cm multiloculated abscess formation in the expected region of the appendix/right adnexa. Differential diagnosis includes tubo-ovarian abscess. 2. Cholelithiasis with no acute cholecystitis. 3. Colonic diverticulosis with no acute diverticulitis. 4. A 1.3 cm left adrenal mass, probable benign adenoma. Recommend 1  year follow up adrenal washout CT. If stable for > 1 year, no further f/u imaging. JACR 2017 Aug; 14(8):1038-44, JCAT 2016 Mar-Apr; 40(2):194-200, Urol J 2006 Spring; 3(2):71-4. Electronically Signed   By: Tish Frederickson M.D.   On: 07/06/2022 16:05    Anti-infectives: Anti-infectives (From admission, onward)    Start     Dose/Rate Route Frequency Ordered Stop   07/07/22 1000  valACYclovir (VALTREX) tablet 500 mg        500 mg Oral Daily 07/06/22 2318     07/07/22 0000  piperacillin-tazobactam (ZOSYN) IVPB 3.375 g        3.375 g 12.5 mL/hr over 240 Minutes Intravenous Every 8 hours 07/06/22 2318 07/14/22 0159   07/06/22 1615  piperacillin-tazobactam (ZOSYN) IVPB 3.375 g        3.375 g 100 mL/hr over 30 Minutes Intravenous  Once 07/06/22 1609 07/06/22 1734        Assessment/Plan Perforated appendicitis with abscess  - IR consult for drain placement. Continue IV zosyn. Ok for clear liquids when she returns from IR. - Scheduled for bariatric surgery with Dr. Dossie Der next month, will notify him that she is here and figure out follow up.  ID - zosyn 8/17>> FEN - IVF, NPO VTE - start lovenox after procedure Foley - none  Obesity BMI 44.67  I reviewed last 24 h vitals and pain scores, last 48 h intake and output, last 24 h labs and trends, and last 24 h imaging results.    LOS: 1 day    Franne Forts, [facility] 07/07/2022,  9:45 AM Please see Amion for pager number during day hours 7:00am-4:30pm

## 2022-07-07 NOTE — TOC CM/SW Note (Signed)
  Transition of Care [facility]) Screening Note   Patient Details  Name: Monica [name] Date of Birth: Feb 26, 1976     Transition of Care Department [facility]) has reviewed patient and no TOC needs have been identified at this time. We will continue to monitor patient advancement through interdisciplinary progression rounds. If new patient transition needs arise, please place a TOC consult.

## 2022-07-08 LAB — CBC
HCT: 30 % — ABNORMAL LOW (ref 36.0–46.0)
Hemoglobin: 9.9 g/dL — ABNORMAL LOW (ref 12.0–15.0)
MCH: 27.6 pg (ref 26.0–34.0)
MCHC: 33 g/dL (ref 30.0–36.0)
MCV: 83.6 fL (ref 80.0–100.0)
Platelets: 326 10*3/uL (ref 150–400)
RBC: 3.59 MIL/uL — ABNORMAL LOW (ref 3.87–5.11)
RDW: 12.4 % (ref 11.5–15.5)
WBC: 7.4 10*3/uL (ref 4.0–10.5)
nRBC: 0 % (ref 0.0–0.2)

## 2022-07-08 LAB — BASIC METABOLIC PANEL
Anion gap: 9 (ref 5–15)
BUN: 5 mg/dL — ABNORMAL LOW (ref 6–20)
CO2: 22 mmol/L (ref 22–32)
Calcium: 8.4 mg/dL — ABNORMAL LOW (ref 8.9–10.3)
Chloride: 106 mmol/L (ref 98–111)
Creatinine, Ser: 0.77 mg/dL (ref 0.44–1.00)
GFR, Estimated: >60 mL/min (ref >60)
Glucose, Bld: 97 mg/dL (ref 70–99)
Potassium: 4.1 mmol/L (ref 3.5–5.1)
Sodium: 137 mmol/L (ref 135–145)

## 2022-07-08 NOTE — Progress Notes (Signed)
Assessment & Plan: HD#3 - Perforated appendicitis with abscess  - IV Zosyn - clear liquid diet - IR consult for drain placement - no suitable window for percutaneous drainage - Scheduled for bariatric surgery with Dr. Dossie Der next month, notified.  Patient and husband unhappy with care from surgical team.  No explanation given yesterday regarding inability to have percutaneous drainage procedure.  Maintained NPO for a long time.  I spend 30 minutes with patient and family.  I explained radiographic findings and recommendations and provided them with copies of the reports to review.  Plan clear liquid diet today, IV abx.  Will follow clinically.  WBC has normalized.  May advance diet tomorrow.  May wait and plan repeat CT scan early this week.  Discussed possibility of surgery this admission versus interval appendectomy.  Patient and her husband understand.   ID - zosyn 8/17>> FEN - IVF, NPO VTE - start lovenox after procedure Foley - none   Obesity BMI 44.67        Darnell Level, MD [facility] A [facility] practice Office: [phone]        Chief Complaint: Perforated appendicitis  Subjective: Patient in bed, some pain.  Unhappy with lack of communication by surgical team.  Family at bedside.  Objective: Vital signs in last 24 hours: Temp:  [98.2 F (36.8 C)-98.7 F (37.1 C)] 98.3 F (36.8 C) (08/19 0800) Pulse Rate:  [58-100] 74 (08/19 0800) Resp:  [16-17] 17 (08/19 0800) BP: (101-124)/(60-85) 106/66 (08/19 0800) SpO2:  [95 %-100 %] 96 % (08/19 0800) Last BM Date : 07/06/22  Intake/Output from previous day: 08/18 0701 - 08/19 0700 In: 1966.7 [I.V.:1804.7; IV Piggyback:162] Out: -  Intake/Output this shift: Total I/O In: 240 [P.O.:240] Out: -   Physical Exam: HEENT - sclerae clear, mucous membranes moist Neck - soft Abdomen - soft without distension; mild to moderate tenderness RLQ, no mass Ext - no edema, non-tender Neuro - alert &  oriented, no focal deficits  Lab Results:  Recent Labs    07/07/22 0110 07/08/22 0051  WBC 9.3 7.4  HGB 11.9* 9.9*  HCT 36.8 30.0*  PLT 334 326   BMET Recent Labs    07/07/22 0110 07/08/22 0610  NA 138 137  K 3.7 4.1  CL 105 106  CO2 26 22  GLUCOSE 99 97  BUN 7 5*  CREATININE 0.74 0.77  CALCIUM 8.6* 8.4*   PT/INR No results for input(s): "LABPROT", "INR" in the last 72 hours. Comprehensive Metabolic Panel:    Component Value Date/Time   NA 137 07/08/2022 0610   NA 138 07/07/2022 0110   K 4.1 07/08/2022 0610   K 3.7 07/07/2022 0110   CL 106 07/08/2022 0610   CL 105 07/07/2022 0110   CO2 22 07/08/2022 0610   CO2 26 07/07/2022 0110   BUN 5 (L) 07/08/2022 0610   BUN 7 07/07/2022 0110   CREATININE 0.77 07/08/2022 0610   CREATININE 0.74 07/07/2022 0110   GLUCOSE 97 07/08/2022 0610   GLUCOSE 99 07/07/2022 0110   CALCIUM 8.4 (L) 07/08/2022 0610   CALCIUM 8.6 (L) 07/07/2022 0110   AST 13 (L) 07/06/2022 1445   AST 19 03/15/2020 1818   ALT 8 07/06/2022 1445   ALT 13 03/15/2020 1818   ALKPHOS 80 07/06/2022 1445   ALKPHOS 105 03/15/2020 1818   BILITOT 0.5 07/06/2022 1445   BILITOT 0.3 03/15/2020 1818   PROT 8.0 07/06/2022 1445   PROT 5.4 (L) 03/15/2020 1818  ALBUMIN 4.5 07/06/2022 1445   ALBUMIN 2.4 (L) 03/15/2020 1818    Studies/Results: US PELVIC COMPLETE W TRANSVAGINAL AND TORSION R/O  Result Date: 07/06/2022 CLINICAL DATA:  Right lower quadrant and suprapubic pelvic pain for 3-4 days C-section in 2021 EXAM: TRANSABDOMINAL AND TRANSVAGINAL ULTRASOUND OF PELVIS TECHNIQUE: Both transabdominal and transvaginal ultrasound examinations of the pelvis were performed. Transabdominal technique was performed for global imaging of the pelvis including uterus, ovaries, adnexal regions, and pelvic cul-de-sac. It was necessary to proceed with endovaginal exam following the transabdominal exam to visualize the adnexa. COMPARISON:  Same day CT abdomen and pelvis FINDINGS:  Uterus Measurements: 10.3 x 4.1 x 4.8 cm = volume: 105.9 mL. No fibroids or other mass visualized. Endometrium Thickness: 5.1 mm.  No focal abnormality visualized. Right ovary Measurements: 3.0 x 2.2 x 2.6 cm = volume: 8.7 mL. 2.5 cm dominant follicle. Left ovary Measurements: Not well visualized due to overlying bowel and body habitus Other findings Multiloculated inflammatory process in the right lower quadrant likely related to perforated appendicitis though tubo-ovarian abscess is difficult to exclude. Small amount of fluid in the cervix. Pulsed Doppler evaluation demonstrates normal low-resistance arterial and venous waveforms in the right ovary. The left ovary was not well visualized. IMPRESSION: Multiloculated inflammatory process about the right lower quadrant/adnexa likely related to perforated appendicitis as seen on CT earlier today. Tubo-ovarian abscesses thought less likely though difficult to exclude. Electronically Signed   By: Minerva Fester M.D.   On: 07/06/2022 18:46   CT Abdomen Pelvis W Contrast  Result Date: 07/06/2022 CLINICAL DATA:  RLQ abdominal pain (Age >= 14y) EXAM: CT ABDOMEN AND PELVIS WITH CONTRAST TECHNIQUE: Multidetector CT imaging of the abdomen and pelvis was performed using the standard protocol following bolus administration of intravenous contrast. RADIATION DOSE REDUCTION: This exam was performed according to the departmental dose-optimization program which includes automated exposure control, adjustment of the mA and/or kV according to patient size and/or use of iterative reconstruction technique. CONTRAST:  OMNIPAQUE IOHEXOL 300 MG/ML  SOLN COMPARISON:  None Available. FINDINGS: Lower chest: No acute abnormality. Hepatobiliary: No focal liver abnormality. Calcified gallstone noted within the gallbladder lumen. No gallbladder wall thickening or pericholecystic fluid. No biliary dilatation. Pancreas: No focal lesion. Normal pancreatic contour. No surrounding  inflammatory changes. No main pancreatic ductal dilatation. Spleen: Normal in size without focal abnormality. Adrenals/Urinary Tract: There is a 1.3 cm left adrenal gland nodule with a density of 29 Hounsfield units. No right adrenal gland nodule. Bilateral kidneys enhance symmetrically. No hydronephrosis. No hydroureter. The urinary bladder is unremarkable. Stomach/Bowel: Stomach is within normal limits. No evidence of bowel wall thickening or dilatation. No normal appendix identified. Colonic diverticulosis. There is a 5.4 x 2.9 cm multiloculated thick-walled and peripherally enhancing cystic lesion within the expected region of the appendix/right adnexa. Vascular/Lymphatic: No abdominal aorta or iliac aneurysm. No abdominal, pelvic, or inguinal lymphadenopathy. Reproductive: Uterus and left adnexa are unremarkable. No right ovary identified. Other: No intraperitoneal free fluid. No intraperitoneal free gas. No organized fluid collection. Musculoskeletal: Tiny fat containing umbilical hernia. No suspicious lytic or blastic osseous lesions. No acute displaced fracture. Multilevel degenerative changes of the spine. IMPRESSION: 1. Likely perforated acute appendicitis with a like 5.4 x 2.9 cm multiloculated abscess formation in the expected region of the appendix/right adnexa. Differential diagnosis includes tubo-ovarian abscess. 2. Cholelithiasis with no acute cholecystitis. 3. Colonic diverticulosis with no acute diverticulitis. 4. A 1.3 cm left adrenal mass, probable benign adenoma. Recommend 1 year follow up adrenal washout  CT. If stable for > 1 year, no further f/u imaging. JACR 2017 Aug; 14(8):1038-44, JCAT 2016 Mar-Apr; 40(2):194-200, Urol J 2006 Spring; 3(2):71-4. Electronically Signed   By: Tish Frederickson M.D.   On: 07/06/2022 16:05      Darnell Level 07/08/2022   Patient ID: Doretha Imus, female   DOB: 1976/11/19, 46 y.o.   MRN: [id number]

## 2022-07-08 NOTE — Plan of Care (Signed)
  Problem: Nutrition: Goal: Adequate nutrition will be maintained Outcome: Progressing   Problem: Pain Managment: Goal: General experience of comfort will improve Outcome: Progressing   Problem: Safety: Goal: Ability to remain free from injury will improve Outcome: Progressing   

## 2022-07-09 LAB — CBC
HCT: 33.5 % — ABNORMAL LOW (ref 36.0–46.0)
Hemoglobin: 11 g/dL — ABNORMAL LOW (ref 12.0–15.0)
MCH: 27.2 pg (ref 26.0–34.0)
MCHC: 32.8 g/dL (ref 30.0–36.0)
MCV: 82.7 fL (ref 80.0–100.0)
Platelets: 376 10*3/uL (ref 150–400)
RBC: 4.05 MIL/uL (ref 3.87–5.11)
RDW: 12.6 % (ref 11.5–15.5)
WBC: 7.1 10*3/uL (ref 4.0–10.5)
nRBC: 0 % (ref 0.0–0.2)

## 2022-07-09 LAB — BASIC METABOLIC PANEL
Anion gap: 9 (ref 5–15)
BUN: <5 mg/dL — ABNORMAL LOW (ref 6–20)
CO2: 24 mmol/L (ref 22–32)
Calcium: 8.6 mg/dL — ABNORMAL LOW (ref 8.9–10.3)
Chloride: 107 mmol/L (ref 98–111)
Creatinine, Ser: 0.75 mg/dL (ref 0.44–1.00)
GFR, Estimated: >60 mL/min (ref >60)
Glucose, Bld: 97 mg/dL (ref 70–99)
Potassium: 4.1 mmol/L (ref 3.5–5.1)
Sodium: 140 mmol/L (ref 135–145)

## 2022-07-09 MED ORDER — FLUCONAZOLE 100 MG PO TABS
200.0000 mg | ORAL_TABLET | Freq: Every day | ORAL | Status: DC
  Administered 2022-07-10 – 2022-07-11 (×2): 200 mg via ORAL
  Filled 2022-07-09 (×3): qty 2

## 2022-07-09 NOTE — Plan of Care (Signed)
  Problem: Nutrition: Goal: Adequate nutrition will be maintained Outcome: Progressing   Problem: Pain Managment: Goal: General experience of comfort will improve Outcome: Progressing   Problem: Safety: Goal: Ability to remain free from injury will improve Outcome: Progressing   

## 2022-07-09 NOTE — Progress Notes (Signed)
    Assessment & Plan: HD#4 - Perforated appendicitis with abscess  - continue IV Zosyn - advance to regular diet - hopefully home in AM 8/21 on oral abx's and follow up with Dr. Dossie Der   ID - zosyn 8/17>> FEN - IVF, advance to regular diet today VTE - Lovenox Foley - none   Obesity BMI 44.67        Darnell Level, MD [facility] A [facility] practice Office: [phone]        Chief Complaint: Perforated appendicitis with abscess  Subjective: Patient much improved, less pain, tolerating clear liquid diet.  Ambulatory.  Objective: Vital signs in last 24 hours: Temp:  [98.1 F (36.7 C)-98.7 F (37.1 C)] 98.1 F (36.7 C) (08/20 0838) Pulse Rate:  [67-79] 71 (08/20 0838) Resp:  [16-17] 16 (08/20 0838) BP: (104-131)/(68-90) 110/68 (08/20 0838) SpO2:  [96 %-100 %] 96 % (08/20 0838) Last BM Date : 07/06/22  Intake/Output from previous day: 08/19 0701 - 08/20 0700 In: 790 [P.O.:790] Out: -  Intake/Output this shift: No intake/output data recorded.  Physical Exam: HEENT - sclerae clear, mucous membranes moist Neck - soft Abdomen - soft without distension; minimal RLQ tenderness; no mass; no guarding Ext - no edema, non-tender Neuro - alert & oriented, no focal deficits  Lab Results:  Recent Labs    07/08/22 0051 07/09/22 0134  WBC 7.4 7.1  HGB 9.9* 11.0*  HCT 30.0* 33.5*  PLT 326 376   BMET Recent Labs    07/08/22 0610 07/09/22 0134  NA 137 140  K 4.1 4.1  CL 106 107  CO2 22 24  GLUCOSE 97 97  BUN 5* <5*  CREATININE 0.77 0.75  CALCIUM 8.4* 8.6*   PT/INR No results for input(s): "LABPROT", "INR" in the last 72 hours. Comprehensive Metabolic Panel:    Component Value Date/Time   NA 140 07/09/2022 0134   NA 137 07/08/2022 0610   K 4.1 07/09/2022 0134   K 4.1 07/08/2022 0610   CL 107 07/09/2022 0134   CL 106 07/08/2022 0610   CO2 24 07/09/2022 0134   CO2 22 07/08/2022 0610   BUN <5 (L) 07/09/2022 0134   BUN 5 (L)  07/08/2022 0610   CREATININE 0.75 07/09/2022 0134   CREATININE 0.77 07/08/2022 0610   GLUCOSE 97 07/09/2022 0134   GLUCOSE 97 07/08/2022 0610   CALCIUM 8.6 (L) 07/09/2022 0134   CALCIUM 8.4 (L) 07/08/2022 0610   AST 13 (L) 07/06/2022 1445   AST 19 03/15/2020 1818   ALT 8 07/06/2022 1445   ALT 13 03/15/2020 1818   ALKPHOS 80 07/06/2022 1445   ALKPHOS 105 03/15/2020 1818   BILITOT 0.5 07/06/2022 1445   BILITOT 0.3 03/15/2020 1818   PROT 8.0 07/06/2022 1445   PROT 5.4 (L) 03/15/2020 1818   ALBUMIN 4.5 07/06/2022 1445   ALBUMIN 2.4 (L) 03/15/2020 1818    Studies/Results: No results found.    Darnell Level 07/09/2022   Patient ID: Monica [name], female   DOB: October 09, 1976, 46 y.o.   MRN: [id number]

## 2022-07-10 LAB — BASIC METABOLIC PANEL
Anion gap: 11 (ref 5–15)
BUN: 5 mg/dL — ABNORMAL LOW (ref 6–20)
CO2: 23 mmol/L (ref 22–32)
Calcium: 9 mg/dL (ref 8.9–10.3)
Chloride: 103 mmol/L (ref 98–111)
Creatinine, Ser: 0.86 mg/dL (ref 0.44–1.00)
GFR, Estimated: >60 mL/min (ref >60)
Glucose, Bld: 97 mg/dL (ref 70–99)
Potassium: 3.7 mmol/L (ref 3.5–5.1)
Sodium: 137 mmol/L (ref 135–145)

## 2022-07-10 LAB — MAGNESIUM: Magnesium: 1.9 mg/dL (ref 1.7–2.4)

## 2022-07-10 LAB — CBC
HCT: 36.1 % (ref 36.0–46.0)
Hemoglobin: 12.1 g/dL (ref 12.0–15.0)
MCH: 27.4 pg (ref 26.0–34.0)
MCHC: 33.5 g/dL (ref 30.0–36.0)
MCV: 81.9 fL (ref 80.0–100.0)
Platelets: 408 10*3/uL — ABNORMAL HIGH (ref 150–400)
RBC: 4.41 MIL/uL (ref 3.87–5.11)
RDW: 12.5 % (ref 11.5–15.5)
WBC: 6.1 10*3/uL (ref 4.0–10.5)
nRBC: 0 % (ref 0.0–0.2)

## 2022-07-10 MED ORDER — ONDANSETRON 4 MG PO TBDP: 4.0000 mg | ORAL_TABLET | Freq: Four times a day (QID) | ORAL | 0 refills | Status: DC | PRN

## 2022-07-10 MED ORDER — SACCHAROMYCES BOULARDII 250 MG PO CAPS: 250.0000 mg | ORAL_CAPSULE | Freq: Two times a day (BID) | ORAL | Status: DC

## 2022-07-10 MED ORDER — AMOXICILLIN-POT CLAVULANATE 875-125 MG PO TABS: 1.0000 | ORAL_TABLET | Freq: Two times a day (BID) | ORAL | 0 refills | Status: AC

## 2022-07-10 MED ORDER — CALCIUM CARBONATE ANTACID 500 MG PO CHEW: 1.0000 | CHEWABLE_TABLET | Freq: Four times a day (QID) | ORAL | Status: DC | PRN

## 2022-07-10 MED ORDER — POLYETHYLENE GLYCOL 3350 17 G PO PACK: 17.0000 g | PACK | Freq: Every day | ORAL | 0 refills | Status: AC | PRN

## 2022-07-10 MED ORDER — BOOST / RESOURCE BREEZE PO LIQD CUSTOM
1.0000 | Freq: Two times a day (BID) | ORAL | Status: DC
  Administered 2022-07-10 – 2022-07-11 (×3): 1 via ORAL

## 2022-07-10 MED ORDER — PANTOPRAZOLE SODIUM 40 MG PO TBEC
40.0000 mg | DELAYED_RELEASE_TABLET | Freq: Every day | ORAL | Status: DC
  Administered 2022-07-10: 40 mg via ORAL
  Filled 2022-07-10: qty 1

## 2022-07-10 MED ORDER — MELATONIN 5 MG PO TABS
5.0000 mg | ORAL_TABLET | Freq: Every day | ORAL | Status: DC
  Administered 2022-07-10: 5 mg via ORAL
  Filled 2022-07-10: qty 1

## 2022-07-10 MED ORDER — FLUCONAZOLE 200 MG PO TABS: 200.0000 mg | ORAL_TABLET | Freq: Every day | ORAL | 0 refills | Status: AC

## 2022-07-10 MED ORDER — DIPHENHYDRAMINE HCL 50 MG/ML IJ SOLN
25.0000 mg | Freq: Every evening | INTRAMUSCULAR | Status: DC | PRN
Start: 2022-07-10 — End: 2022-07-11

## 2022-07-10 MED ORDER — SACCHAROMYCES BOULARDII 250 MG PO CAPS
250.0000 mg | ORAL_CAPSULE | Freq: Two times a day (BID) | ORAL | Status: DC
  Administered 2022-07-10 – 2022-07-11 (×3): 250 mg via ORAL
  Filled 2022-07-10 (×3): qty 1

## 2022-07-10 MED ORDER — BISACODYL 10 MG RE SUPP
10.0000 mg | Freq: Once | RECTAL | Status: AC
  Administered 2022-07-10: 10 mg via RECTAL
  Filled 2022-07-10: qty 1

## 2022-07-10 MED ORDER — DOCUSATE SODIUM 100 MG PO CAPS
100.0000 mg | ORAL_CAPSULE | Freq: Two times a day (BID) | ORAL | Status: DC
  Administered 2022-07-10 – 2022-07-11 (×3): 100 mg via ORAL
  Filled 2022-07-10 (×3): qty 1

## 2022-07-10 NOTE — Progress Notes (Signed)
[facility] Progress Note     Subjective: CC-  Lower abdominal pain still improved, but after starting solid food she has developed some upper abdominal pain, indigestion and nausea with PO intake, no emesis. No BM since last Wednesday. Was passing flatus but did not pass any yesterday or today.  Objective: Vital signs in last 24 hours: Temp:  [98.1 F (36.7 C)-98.3 F (36.8 C)] 98.1 F (36.7 C) (08/21 0714) Pulse Rate:  [63-85] 69 (08/21 0714) Resp:  [16-18] 16 (08/21 0714) BP: (105-116)/(60-74) 105/60 (08/21 0714) SpO2:  [97 %-100 %] 97 % (08/21 0714) Last BM Date : 07/09/22  Intake/Output from previous day: 08/20 0701 - 08/21 0700 In: 400 [P.O.:400] Out: -  Intake/Output this shift: No intake/output data recorded.  PE: Gen:  Alert, NAD, pleasant Abd: soft, nondistended, no lower abdominal tenderness, mild RUQ and epigastric TTP without rebound or guarding  Lab Results:  Recent Labs    07/08/22 0051 07/09/22 0134  WBC 7.4 7.1  HGB 9.9* 11.0*  HCT 30.0* 33.5*  PLT 326 376   BMET Recent Labs    07/08/22 0610 07/09/22 0134  NA 137 140  K 4.1 4.1  CL 106 107  CO2 22 24  GLUCOSE 97 97  BUN 5* <5*  CREATININE 0.77 0.75  CALCIUM 8.4* 8.6*   PT/INR No results for input(s): "LABPROT", "INR" in the last 72 hours. CMP     Component Value Date/Time   NA 140 07/09/2022 0134   K 4.1 07/09/2022 0134   CL 107 07/09/2022 0134   CO2 24 07/09/2022 0134   GLUCOSE 97 07/09/2022 0134   BUN <5 (L) 07/09/2022 0134   CREATININE 0.75 07/09/2022 0134   CALCIUM 8.6 (L) 07/09/2022 0134   PROT 8.0 07/06/2022 1445   ALBUMIN 4.5 07/06/2022 1445   AST 13 (L) 07/06/2022 1445   ALT 8 07/06/2022 1445   ALKPHOS 80 07/06/2022 1445   BILITOT 0.5 07/06/2022 1445   GFRNONAA >60 07/09/2022 0134   GFRAA >60 03/15/2020 1818   Lipase     Component Value Date/Time   LIPASE 20 07/06/2022 1445       Studies/Results: No results  found.  Anti-infectives: Anti-infectives (From admission, onward)    Start     Dose/Rate Route Frequency Ordered Stop   07/10/22 1000  fluconazole (DIFLUCAN) tablet 200 mg        200 mg Oral Daily 07/09/22 2223 07/13/22 0959   07/07/22 1000  valACYclovir (VALTREX) tablet 500 mg        500 mg Oral Daily 07/06/22 2318     07/07/22 0000  piperacillin-tazobactam (ZOSYN) IVPB 3.375 g        3.375 g 12.5 mL/hr over 240 Minutes Intravenous Every 8 hours 07/06/22 2318 07/14/22 0159   07/06/22 1615  piperacillin-tazobactam (ZOSYN) IVPB 3.375 g        3.375 g 100 mL/hr over 30 Minutes Intravenous  Once 07/06/22 1609 07/06/22 1734        Assessment/Plan Perforated appendicitis with abscess  - CT 8/17 with likely perforated appendicitis and 5.4x2.9cm multiloculated abscess - IR unable to drain - continue IV zosyn with plans to convert to PO abx at discharge - May have developed a mild ileus. Check labs this morning. Add colace, dulcolax suppository. She is on protonix, add Tums PRN. Mobilize.    ID - zosyn 8/17>> FEN - IVF, reg diet, Boost VTE - lovenox  Foley - none Follow up - Dr. Dossie Der   Obesity  BMI 44.67   I reviewed last 24 h vitals and pain scores, last 48 h intake and output, last 24 h labs and trends     LOS: 4 days    Franne Forts, [facility] 07/10/2022, 8:40 AM Please see Amion for pager number during day hours 7:00am-4:30pm

## 2022-07-10 NOTE — Discharge Summary (Incomplete)
[facility] Discharge Summary   Patient ID: Monica [name] MRN: [id number] DOB/AGE: 1976/05/05 46 y.o.  Admit date: 07/06/2022 Discharge date: 07/11/2022  Admitting Diagnosis: Perforated appendicitis   Discharge Diagnosis Patient Active Problem List   Diagnosis Date Noted   Perforated appendicitis 07/06/2022   Acute appendicitis with perforation, localized peritonitis, and abscess 07/06/2022   Pregnant and not yet delivered in third trimester 03/15/2020   S/P cesarean section 10/20/2017    Consultants Interventional radiology  Imaging: No results found.  Procedures None  Hospital Course:  Monica [name] is a 46 y.o. female scheduled for bariatric surgery with Dr. Dossie Der next month, who developed right lower quadrant abdominal pain this past Monday evening while she was at a pre-bariatric surgery class. [facility] where white blood cell count was 11,000.  CT scan of the abdomen pelvis demonstrated a 5.4 x 2.9 cm abscess in her right lower quadrant due to perforated appendicitis versus TOA.  She underwent further evaluation with an ultrasound which showed findings more consistent with perforated appendicitis.  General surgery accepted her in transfer for surgical [facility] at [facility]. Patient was admitted to the hospital and started on IV zosyn. Interventional radiology was consulted and advised that the abscess was small and had no safe window for percutaneous drainage. She was monitored on antibiotics and clinically improved. Leukocytosis resolved. Diet was advanced as tolerated. On 07/11/22 the patient was felt stable for discharge home. She will be discharged with 6 days of augmentin to complete a 10 day course. Patient will follow up as below and knows to call with questions or concerns.      Allergies as of 07/10/2022   No Known Allergies      Medication List     TAKE these medications    amoxicillin-clavulanate 875-125 MG  tablet Commonly known as: AUGMENTIN Take 1 tablet by mouth 2 (two) times daily for 6 days.   fluconazole 200 MG tablet Commonly known as: DIFLUCAN Take 1 tablet (200 mg total) by mouth daily for 2 days. Start taking on: July 11, 2022   fluticasone 50 MCG/ACT nasal spray Commonly known as: FLONASE Place 1-2 sprays into both nostrils daily as needed for allergies.   ibuprofen 200 MG tablet Commonly known as: ADVIL Take 800 mg by mouth at bedtime.   ketotifen 0.025 % ophthalmic solution Commonly known as: ZADITOR Place 1 drop into both eyes daily as needed (itchy eyes).   levocetirizine 5 MG tablet Commonly known as: XYZAL Take 5 mg by mouth every evening.   multivitamin with minerals tablet Take 1 tablet by mouth daily.   omeprazole 40 MG capsule Commonly known as: PRILOSEC Take 40 mg by mouth daily.   polyethylene glycol 17 g packet Commonly known as: MiraLax Take 17 g by mouth daily as needed for mild constipation.   saccharomyces boulardii 250 MG capsule Commonly known as: FLORASTOR Take 1 capsule (250 mg total) by mouth 2 (two) times daily. You can find a probiotic over the counter. Recommend taking while you are on antibiotics.   valACYclovir 500 MG tablet Commonly known as: VALTREX Take 500 mg by mouth daily.   VITAMIN D-3 PO Take 1 capsule by mouth daily.          Follow-up Information     Stechschulte, Hyman Hopes, MD. Go on 07/26/2022.   Specialty: Surgery Why: Your appointment is 9/6 at 4pm Please arrive 15 minutes early for check in and paperwork Contact information:  [address] [place] [address] [phone]                  Signed: Franne Forts, [facility] 07/10/2022, 2:19 PM Please see Amion for pager number during day hours 7:00am-4:30pm

## 2022-07-11 LAB — CBC
HCT: 34 % — ABNORMAL LOW (ref 36.0–46.0)
Hemoglobin: 11.4 g/dL — ABNORMAL LOW (ref 12.0–15.0)
MCH: 27.1 pg (ref 26.0–34.0)
MCHC: 33.5 g/dL (ref 30.0–36.0)
MCV: 81 fL (ref 80.0–100.0)
Platelets: 375 10*3/uL (ref 150–400)
RBC: 4.2 MIL/uL (ref 3.87–5.11)
RDW: 12.6 % (ref 11.5–15.5)
WBC: 8.1 10*3/uL (ref 4.0–10.5)
nRBC: 0 % (ref 0.0–0.2)

## 2022-07-11 LAB — BASIC METABOLIC PANEL
Anion gap: 8 (ref 5–15)
BUN: 7 mg/dL (ref 6–20)
CO2: 25 mmol/L (ref 22–32)
Calcium: 8.8 mg/dL — ABNORMAL LOW (ref 8.9–10.3)
Chloride: 105 mmol/L (ref 98–111)
Creatinine, Ser: 0.88 mg/dL (ref 0.44–1.00)
GFR, Estimated: >60 mL/min (ref >60)
Glucose, Bld: 98 mg/dL (ref 70–99)
Potassium: 3.8 mmol/L (ref 3.5–5.1)
Sodium: 138 mmol/L (ref 135–145)

## 2022-07-11 NOTE — Progress Notes (Signed)
Mobility Specialist Progress Note:   07/11/22 1100  Mobility  Activity Ambulated independently in hallway  Level of Assistance Modified independent, requires aide device or extra time  Assistive Device Other (Comment) (IV Pole)  Distance Ambulated (ft) 550 ft  Activity Response Tolerated well  $Mobility charge 1 Mobility   Pt ambulating independently in hallway. No c/o pain throughout. Left with all needs met, eager for d/c.  Nelta Numbers Acute Rehab Secure Chat or Office Phone: [phone]

## 2022-07-11 NOTE — Plan of Care (Signed)
  Problem: Education: Goal: Knowledge of General Education information will improve Description: Including pain rating scale, medication(s)/side effects and non-pharmacologic comfort measures 07/11/2022 0312 by Caroll Rancher, RN Outcome: Progressing 07/11/2022 0307 by Caroll Rancher, RN Outcome: Progressing   Problem: Health Behavior/Discharge Planning: Goal: Ability to manage health-related needs will improve 07/11/2022 0312 by Caroll Rancher, RN Outcome: Progressing 07/11/2022 0307 by Caroll Rancher, RN Outcome: Progressing   Problem: Clinical Measurements: Goal: Ability to maintain clinical measurements within normal limits will improve 07/11/2022 0312 by Caroll Rancher, RN Outcome: Progressing 07/11/2022 0307 by Caroll Rancher, RN Outcome: Progressing Goal: Will remain free from infection 07/11/2022 0312 by Caroll Rancher, RN Outcome: Progressing 07/11/2022 0307 by Caroll Rancher, RN Outcome: Progressing Goal: Diagnostic test results will improve 07/11/2022 0312 by Caroll Rancher, RN Outcome: Progressing 07/11/2022 0307 by Caroll Rancher, RN Outcome: Progressing Goal: Respiratory complications will improve 07/11/2022 0312 by Caroll Rancher, RN Outcome: Progressing 07/11/2022 0307 by Caroll Rancher, RN Outcome: Progressing Goal: Cardiovascular complication will be avoided 07/11/2022 8250 by Caroll Rancher, RN Outcome: Progressing 07/11/2022 0307 by Caroll Rancher, RN Outcome: Progressing

## 2022-07-11 NOTE — Progress Notes (Signed)
Reviewed discharge instructions with pt. Pt discharged to the D/C lounge to wait for ride home without complications

## 2022-07-11 NOTE — Plan of Care (Signed)

## 2022-07-12 ENCOUNTER — Other Ambulatory Visit: Payer: Self-pay | Admitting: Surgery

## 2022-07-12 ENCOUNTER — Other Ambulatory Visit (HOSPITAL_COMMUNITY): Payer: Self-pay | Admitting: Surgery

## 2022-07-12 DIAGNOSIS — K3532 Acute appendicitis with perforation and localized peritonitis, without abscess: Secondary | ICD-10-CM

## 2022-07-18 SURGERY — Surgical Case
Anesthesia: *Unknown

## 2022-07-19 ENCOUNTER — Ambulatory Visit (HOSPITAL_COMMUNITY)
Admission: RE | Admit: 2022-07-19 | Discharge: 2022-07-19 | Disposition: A | Discharge status: Home / Self Care | Payer: Commercial Managed Care - PPO | Source: Ambulatory Visit | Attending: Surgery | Admitting: Surgery

## 2022-07-19 DIAGNOSIS — K3532 Acute appendicitis with perforation and localized peritonitis, without abscess: Secondary | ICD-10-CM | POA: Diagnosis present

## 2022-07-19 MED ORDER — IOHEXOL 350 MG/ML SOLN
100.0000 mL | Freq: Once | INTRAVENOUS | Status: AC | PRN
  Administered 2022-07-19: 100 mL via INTRAVENOUS

## 2022-07-27 ENCOUNTER — Other Ambulatory Visit (HOSPITAL_COMMUNITY): Payer: Self-pay | Admitting: Surgery

## 2022-07-27 ENCOUNTER — Other Ambulatory Visit: Payer: Self-pay | Admitting: Surgery

## 2022-07-27 ENCOUNTER — Ambulatory Visit (HOSPITAL_COMMUNITY)
Admission: RE | Admit: 2022-07-27 | Discharge: 2022-07-27 | Disposition: A | Discharge status: Home / Self Care | Payer: Commercial Managed Care - PPO | Source: Ambulatory Visit | Attending: Surgery | Admitting: Surgery

## 2022-07-27 DIAGNOSIS — K3532 Acute appendicitis with perforation and localized peritonitis, without abscess: Secondary | ICD-10-CM | POA: Insufficient documentation

## 2022-07-27 MED ORDER — SODIUM CHLORIDE (PF) 0.9 % IJ SOLN
INTRAMUSCULAR | Status: AC
  Filled 2022-07-27: qty 50

## 2022-07-27 MED ORDER — IOHEXOL 300 MG/ML  SOLN
100.0000 mL | Freq: Once | INTRAMUSCULAR | Status: AC | PRN
  Administered 2022-07-27: 100 mL via INTRAVENOUS

## 2022-07-28 ENCOUNTER — Other Ambulatory Visit (HOSPITAL_COMMUNITY): Payer: Self-pay

## 2022-07-28 NOTE — Patient Instructions (Addendum)
DUE TO COVID-19 ONLY TWO VISITORS  (aged 44 and older)  ARE ALLOWED TO COME WITH YOU AND STAY IN THE WAITING ROOM ONLY DURING PRE OP AND PROCEDURE.   **NO VISITORS ARE ALLOWED IN THE SHORT STAY AREA OR RECOVERY ROOM!!**  IF YOU WILL BE ADMITTED INTO THE HOSPITAL YOU ARE ALLOWED ONLY FOUR SUPPORT PEOPLE DURING VISITATION HOURS ONLY (7 AM -8PM)   The support person(s) must pass our screening, gel in and out, and wear a mask at all times, including in the patient's room. Patients must also wear a mask when staff or their support person are in the room. Visitors GUEST BADGE MUST BE WORN VISIBLY  One adult visitor may remain with you overnight and MUST be in the room by 8 P.M.     Your procedure is scheduled on: 08/07/22   Report to [facility] Main Entrance    Report to admitting at : 8:30 AM   Call this number if you have problems the morning of surgery [phone]  Clear liquids starting the day before surgery at: 6:00 PM until 7:30 AM DAY OF SURGERY  Water Black Coffee (sugar ok, NO MILK/CREAM OR CREAMERS)  Tea (sugar ok, NO MILK/CREAM OR CREAMERS) regular and decaf                             Plain Jell-O (NO RED)                                           Fruit ices (not with fruit pulp, NO RED)                                     Popsicles (NO RED)                                                                  Juice: apple, WHITE grape, WHITE cranberry Sports drinks like Gatorade (NO RED)              MORNING OF SURGERY DRINK:   DRINK 1 G2 drink BEFORE YOU LEAVE HOME, DRINK ALL OF THE  G2 DRINK AT ONE TIME.   NO SOLID FOOD AFTER 600 PM THE NIGHT BEFORE YOUR SURGERY. YOU MAY DRINK CLEAR FLUIDS. THE G2 DRINK YOU DRINK BEFORE YOU LEAVE HOME WILL BE THE LAST FLUIDS YOU DRINK BEFORE SURGERY.  PAIN IS EXPECTED AFTER SURGERY AND WILL NOT BE COMPLETELY ELIMINATED. AMBULATION AND TYLENOL WILL HELP REDUCE INCISIONAL AND GAS PAIN. MOVEMENT IS KEY!  YOU ARE EXPECTED TO BE OUT OF BED  WITHIN 4 HOURS OF ADMISSION TO YOUR PATIENT ROOM.  SITTING IN THE RECLINER THROUGHOUT THE DAY IS IMPORTANT FOR DRINKING FLUIDS AND MOVING GAS THROUGHOUT THE GI TRACT.  COMPRESSION STOCKINGS SHOULD BE WORN [facility] STAY UNLESS YOU ARE WALKING.   INCENTIVE SPIROMETER SHOULD BE USED EVERY HOUR WHILE AWAKE TO DECREASE POST-OPERATIVE COMPLICATIONS SUCH AS PNEUMONIA.  WHEN DISCHARGED HOME, IT IS IMPORTANT TO CONTINUE TO WALK EVERY HOUR AND USE THE INCENTIVE SPIROMETER EVERY  HOUR.      The day of surgery:  Drink ONE (1) Pre-Surgery Clear Ensure or G2 at AM the morning of surgery. Drink in one sitting. Do not sip.  This drink was given to you during your hospital  pre-op appointment visit. Nothing else to drink after completing the  Pre-Surgery Clear Ensure or G2.          If you have questions, please contact your surgeon's office.   Oral Hygiene is also important to reduce your risk of infection.                                    Remember - BRUSH YOUR TEETH THE MORNING OF SURGERY WITH YOUR REGULAR TOOTHPASTE   Do NOT smoke after Midnight   Take these medicines the morning of surgery with A SIP OF WATER: N/A  DO NOT TAKE ANY ORAL DIABETIC MEDICATIONS DAY OF YOUR SURGERY  Bring CPAP mask and tubing day of surgery.                              You may not have any metal on your body including hair pins, jewelry, and body piercing             Do not wear make-up, lotions, powders, perfumes/cologne, or deodorant  Do not wear nail polish including gel and S&S, artificial/acrylic nails, or any other type of covering on natural nails including finger and toenails. If you have artificial nails, gel coating, etc. that needs to be removed by a nail salon please have this removed prior to surgery or surgery may need to be canceled/ delayed if the surgeon/ anesthesia feels like they are unable to be safely monitored.   Do not shave  48 hours prior to surgery.    Do not bring  valuables to the hospital. [facility] IS NOT             RESPONSIBLE   FOR VALUABLES.   Contacts, dentures or bridgework may not be worn into surgery.   Bring small overnight bag day of surgery.   DO NOT BRING YOUR HOME MEDICATIONS TO THE HOSPITAL. PHARMACY WILL DISPENSE MEDICATIONS LISTED ON YOUR MEDICATION LIST TO YOU DURING YOUR ADMISSION IN THE HOSPITAL!    Patients discharged on the day of surgery will not be allowed to drive home.  Someone NEEDS to stay with you for the first 24 hours after anesthesia.   Special Instructions: Bring a copy of your healthcare power of attorney and living will documents         the day of surgery if you haven't scanned them before.              Please read over the following fact sheets you were given: IF YOU HAVE QUESTIONS ABOUT YOUR PRE-OP INSTRUCTIONS PLEASE CALL [phone]     [facility] - Preparing for Surgery Before surgery, you can play an important role.  Because skin is not sterile, your skin needs to be as free of germs as possible.  You can reduce the number of germs on your skin by washing with CHG (chlorahexidine gluconate) soap before surgery.  CHG is an antiseptic cleaner which kills germs and bonds with the skin to continue killing germs even after washing. Please DO NOT use if you have an allergy to CHG or antibacterial soaps.  If your skin becomes reddened/irritated stop using the CHG and inform your nurse when you arrive at Short Stay. Do not shave (including legs and underarms) for at least 48 hours prior to the first CHG shower.  You may shave your face/neck. Please follow these instructions carefully:  1.  Shower with CHG Soap the night before surgery and the  morning of Surgery.  2.  If you choose to wash your hair, wash your hair first as usual with your  normal  shampoo.  3.  After you shampoo, rinse your hair and body thoroughly to remove the  shampoo.                           4.  Use CHG as you would any other liquid soap.   You can apply chg directly  to the skin and wash                       Gently with a scrungie or clean washcloth.  5.  Apply the CHG Soap to your body ONLY FROM THE NECK DOWN.   Do not use on face/ open                           Wound or open sores. Avoid contact with eyes, ears mouth and genitals (private parts).                       Wash face,  Genitals (private parts) with your normal soap.             6.  Wash thoroughly, paying special attention to the area where your surgery  will be performed.  7.  Thoroughly rinse your body with warm water from the neck down.  8.  DO NOT shower/wash with your normal soap after using and rinsing off  the CHG Soap.                9.  Pat yourself dry with a clean towel.            10.  Wear clean pajamas.            11.  Place clean sheets on your bed the night of your first shower and do not  sleep with pets. Day of Surgery : Do not apply any lotions/deodorants the morning of surgery.  Please wear clean clothes to the hospital/surgery center.  FAILURE TO FOLLOW THESE INSTRUCTIONS MAY RESULT IN THE CANCELLATION OF YOUR SURGERY PATIENT SIGNATURE_________________________________  NURSE SIGNATURE__________________________________  ________________________________________________________________________

## 2022-07-31 ENCOUNTER — Encounter (HOSPITAL_COMMUNITY)
Admission: RE | Admit: 2022-07-31 | Discharge: 2022-07-31 | Disposition: A | Discharge status: Home / Self Care | Payer: Commercial Managed Care - PPO | Source: Ambulatory Visit | Attending: Surgery | Admitting: Surgery

## 2022-07-31 ENCOUNTER — Other Ambulatory Visit: Payer: Self-pay

## 2022-07-31 ENCOUNTER — Encounter (HOSPITAL_COMMUNITY): Payer: Self-pay

## 2022-07-31 DIAGNOSIS — Z01818 Encounter for other preprocedural examination: Secondary | ICD-10-CM

## 2022-07-31 DIAGNOSIS — Z01812 Encounter for preprocedural laboratory examination: Secondary | ICD-10-CM | POA: Diagnosis present

## 2022-07-31 LAB — CBC WITH DIFFERENTIAL/PLATELET
Abs Immature Granulocytes: 0.01 10*3/uL (ref 0.00–0.07)
Basophils Absolute: 0 10*3/uL (ref 0.0–0.1)
Basophils Relative: 1 %
Eosinophils Absolute: 0 10*3/uL (ref 0.0–0.5)
Eosinophils Relative: 1 %
HCT: 37.7 % (ref 36.0–46.0)
Hemoglobin: 12.2 g/dL (ref 12.0–15.0)
Immature Granulocytes: 0 %
Lymphocytes Relative: 34 %
Lymphs Abs: 1.8 10*3/uL (ref 0.7–4.0)
MCH: 27.2 pg (ref 26.0–34.0)
MCHC: 32.4 g/dL (ref 30.0–36.0)
MCV: 84 fL (ref 80.0–100.0)
Monocytes Absolute: 0.4 10*3/uL (ref 0.1–1.0)
Monocytes Relative: 8 %
Neutro Abs: 3 10*3/uL (ref 1.7–7.7)
Neutrophils Relative %: 56 %
Platelets: 307 10*3/uL (ref 150–400)
RBC: 4.49 MIL/uL (ref 3.87–5.11)
RDW: 13.2 % (ref 11.5–15.5)
WBC: 5.2 10*3/uL (ref 4.0–10.5)
nRBC: 0 % (ref 0.0–0.2)

## 2022-07-31 LAB — COMPREHENSIVE METABOLIC PANEL
ALT: 14 U/L (ref 0–44)
AST: 16 U/L (ref 15–41)
Albumin: 4.3 g/dL (ref 3.5–5.0)
Alkaline Phosphatase: 71 U/L (ref 38–126)
Anion gap: 6 (ref 5–15)
BUN: 15 mg/dL (ref 6–20)
CO2: 26 mmol/L (ref 22–32)
Calcium: 9.1 mg/dL (ref 8.9–10.3)
Chloride: 106 mmol/L (ref 98–111)
Creatinine, Ser: 0.7 mg/dL (ref 0.44–1.00)
GFR, Estimated: >60 mL/min (ref >60)
Glucose, Bld: 104 mg/dL — ABNORMAL HIGH (ref 70–99)
Potassium: 4.2 mmol/L (ref 3.5–5.1)
Sodium: 138 mmol/L (ref 135–145)
Total Bilirubin: 0.4 mg/dL (ref 0.3–1.2)
Total Protein: 7.4 g/dL (ref 6.5–8.1)

## 2022-07-31 NOTE — Progress Notes (Signed)
For Short Stay: COVID SWAB appointment date: Date of COVID positive in last 90 days:  Bowel Prep reminder:   For Anesthesia: PCP - NO PCP Cardiologist -   Chest x-ray - 03/08/22 EKG - 03/07/22 Stress Test -  ECHO -  Cardiac Cath -  Pacemaker/ICD device last checked: Pacemaker orders received: Device Rep notified:  Spinal Cord Stimulator:  Sleep Study -  CPAP -   Fasting Blood Sugar -  Checks Blood Sugar _____ times a day Date and result of last Hgb A1c-  Blood Thinner Instructions: Aspirin Instructions: Last Dose:  Activity level: Can go up a flight of stairs and activities of daily living without stopping and without chest pain and/or shortness of breath   Able to exercise without chest pain and/or shortness of breath   Unable to go up a flight of stairs without chest pain and/or shortness of breath     Anesthesia review:   Patient denies shortness of breath, fever, cough and chest pain at PAT appointment   Patient verbalized understanding of instructions that were given to them at the PAT appointment. Patient was also instructed that they will need to review over the PAT instructions again at home before surgery.

## 2022-08-07 ENCOUNTER — Inpatient Hospital Stay (HOSPITAL_COMMUNITY)
Admission: RE | Admit: 2022-08-07 | Discharge: 2022-08-09 | DRG: 619 | Disposition: A | Discharge status: Home / Self Care | Payer: Commercial Managed Care - PPO | Attending: Surgery | Admitting: Surgery

## 2022-08-07 ENCOUNTER — Encounter (HOSPITAL_COMMUNITY): Payer: Self-pay | Admitting: Surgery

## 2022-08-07 ENCOUNTER — Other Ambulatory Visit: Payer: Self-pay

## 2022-08-07 ENCOUNTER — Encounter (HOSPITAL_COMMUNITY)
Admission: RE | Disposition: A | Discharge status: Home / Self Care | Payer: Self-pay | Source: Home / Self Care | Attending: Surgery

## 2022-08-07 ENCOUNTER — Inpatient Hospital Stay (HOSPITAL_COMMUNITY): Payer: Commercial Managed Care - PPO | Admitting: Anesthesiology

## 2022-08-07 DIAGNOSIS — K219 Gastro-esophageal reflux disease without esophagitis: Secondary | ICD-10-CM | POA: Diagnosis present

## 2022-08-07 DIAGNOSIS — Z6841 Body Mass Index (BMI) 40.0 and over, adult: Secondary | ICD-10-CM

## 2022-08-07 DIAGNOSIS — J45909 Unspecified asthma, uncomplicated: Secondary | ICD-10-CM | POA: Diagnosis present

## 2022-08-07 DIAGNOSIS — Z79899 Other long term (current) drug therapy: Secondary | ICD-10-CM | POA: Diagnosis not present

## 2022-08-07 DIAGNOSIS — F419 Anxiety disorder, unspecified: Secondary | ICD-10-CM | POA: Diagnosis present

## 2022-08-07 DIAGNOSIS — Z8249 Family history of ischemic heart disease and other diseases of the circulatory system: Secondary | ICD-10-CM | POA: Diagnosis not present

## 2022-08-07 DIAGNOSIS — K449 Diaphragmatic hernia without obstruction or gangrene: Secondary | ICD-10-CM

## 2022-08-07 DIAGNOSIS — Z8619 Personal history of other infectious and parasitic diseases: Secondary | ICD-10-CM

## 2022-08-07 DIAGNOSIS — K3532 Acute appendicitis with perforation and localized peritonitis, without abscess: Secondary | ICD-10-CM | POA: Diagnosis present

## 2022-08-07 DIAGNOSIS — F418 Other specified anxiety disorders: Secondary | ICD-10-CM | POA: Diagnosis not present

## 2022-08-07 DIAGNOSIS — Z823 Family history of stroke: Secondary | ICD-10-CM

## 2022-08-07 DIAGNOSIS — Z01818 Encounter for other preprocedural examination: Secondary | ICD-10-CM

## 2022-08-07 HISTORY — PX: LAPAROSCOPIC APPENDECTOMY: SHX408

## 2022-08-07 HISTORY — PX: HIATAL HERNIA REPAIR: SHX195

## 2022-08-07 LAB — POCT PREGNANCY, URINE: Preg Test, Ur: NEGATIVE

## 2022-08-07 LAB — CREATININE, SERUM
Creatinine, Ser: 0.67 mg/dL (ref 0.44–1.00)
GFR, Estimated: >60 mL/min (ref >60)

## 2022-08-07 LAB — TYPE AND SCREEN
ABO/RH(D): O POS
Antibody Screen: NEGATIVE

## 2022-08-07 LAB — CBC
HCT: 38.2 % (ref 36.0–46.0)
Hemoglobin: 12.1 g/dL (ref 12.0–15.0)
MCH: 27 pg (ref 26.0–34.0)
MCHC: 31.7 g/dL (ref 30.0–36.0)
MCV: 85.3 fL (ref 80.0–100.0)
Platelets: 312 10*3/uL (ref 150–400)
RBC: 4.48 MIL/uL (ref 3.87–5.11)
RDW: 13.3 % (ref 11.5–15.5)
WBC: 11.4 10*3/uL — ABNORMAL HIGH (ref 4.0–10.5)
nRBC: 0 % (ref 0.0–0.2)

## 2022-08-07 SURGERY — CREATION, GASTRIC BYPASS, ROUX-EN-Y, ROBOT-ASSISTED
Anesthesia: General

## 2022-08-07 MED ORDER — PANTOPRAZOLE SODIUM 40 MG IV SOLR
40.0000 mg | Freq: Every day | INTRAVENOUS | Status: DC
  Administered 2022-08-07 – 2022-08-08 (×2): 40 mg via INTRAVENOUS
  Filled 2022-08-07 (×2): qty 10

## 2022-08-07 MED ORDER — BUPIVACAINE-EPINEPHRINE (PF) 0.25% -1:200000 IJ SOLN
INTRAMUSCULAR | Status: DC | PRN
  Administered 2022-08-07: 30 mL

## 2022-08-07 MED ORDER — OXYCODONE HCL 5 MG PO TABS: 5.0000 mg | ORAL_TABLET | Freq: Once | ORAL | Status: DC | PRN

## 2022-08-07 MED ORDER — LABETALOL HCL 5 MG/ML IV SOLN
INTRAVENOUS | Status: AC
  Filled 2022-08-07: qty 4

## 2022-08-07 MED ORDER — KETOTIFEN FUMARATE 0.025 % OP SOLN: 1.0000 [drp] | Freq: Every day | OPHTHALMIC | Status: DC | PRN

## 2022-08-07 MED ORDER — FENTANYL CITRATE PF 50 MCG/ML IJ SOSY
PREFILLED_SYRINGE | INTRAMUSCULAR | Status: AC
  Administered 2022-08-07: 50 ug via INTRAVENOUS
  Filled 2022-08-07: qty 1

## 2022-08-07 MED ORDER — FLUTICASONE PROPIONATE 50 MCG/ACT NA SUSP: 1.0000 | Freq: Every day | NASAL | Status: DC | PRN

## 2022-08-07 MED ORDER — ROCURONIUM BROMIDE 10 MG/ML (PF) SYRINGE
PREFILLED_SYRINGE | INTRAVENOUS | Status: AC
  Filled 2022-08-07: qty 10

## 2022-08-07 MED ORDER — FENTANYL CITRATE (PF) 100 MCG/2ML IJ SOLN
INTRAMUSCULAR | Status: AC
  Filled 2022-08-07: qty 2

## 2022-08-07 MED ORDER — SODIUM CHLORIDE 0.9 % IV SOLN
2.0000 g | INTRAVENOUS | Status: AC
  Administered 2022-08-07: 2 g via INTRAVENOUS
  Filled 2022-08-07: qty 2

## 2022-08-07 MED ORDER — BUPIVACAINE LIPOSOME 1.3 % IJ SUSP
INTRAMUSCULAR | Status: DC | PRN
  Administered 2022-08-07: 20 mL

## 2022-08-07 MED ORDER — LACTATED RINGERS IV SOLN: INTRAVENOUS | Status: DC

## 2022-08-07 MED ORDER — ONDANSETRON HCL 4 MG/2ML IJ SOLN
4.0000 mg | INTRAMUSCULAR | Status: DC | PRN
  Administered 2022-08-07: 4 mg via INTRAVENOUS
  Filled 2022-08-07: qty 2

## 2022-08-07 MED ORDER — LIDOCAINE HCL (CARDIAC) PF 100 MG/5ML IV SOSY
PREFILLED_SYRINGE | INTRAVENOUS | Status: DC | PRN
  Administered 2022-08-07: 60 mg via INTRAVENOUS

## 2022-08-07 MED ORDER — MORPHINE SULFATE (PF) 2 MG/ML IV SOLN
1.0000 mg | INTRAVENOUS | Status: DC | PRN
  Administered 2022-08-07 – 2022-08-08 (×4): 2 mg via INTRAVENOUS
  Filled 2022-08-07 (×5): qty 1

## 2022-08-07 MED ORDER — ONDANSETRON HCL 4 MG/2ML IJ SOLN
INTRAMUSCULAR | Status: AC
  Filled 2022-08-07: qty 2

## 2022-08-07 MED ORDER — LABETALOL HCL 5 MG/ML IV SOLN
INTRAVENOUS | Status: AC
  Administered 2022-08-07: 5 mg via INTRAVENOUS
  Filled 2022-08-07: qty 4

## 2022-08-07 MED ORDER — KETAMINE HCL 10 MG/ML IJ SOLN
INTRAMUSCULAR | Status: DC | PRN
  Administered 2022-08-07: 40 mg via INTRAVENOUS

## 2022-08-07 MED ORDER — ONDANSETRON HCL 4 MG/2ML IJ SOLN
INTRAMUSCULAR | Status: DC | PRN
  Administered 2022-08-07: 4 mg via INTRAVENOUS

## 2022-08-07 MED ORDER — ACETAMINOPHEN 500 MG PO TABS
1000.0000 mg | ORAL_TABLET | Freq: Three times a day (TID) | ORAL | Status: DC
  Administered 2022-08-07 – 2022-08-08 (×2): 1000 mg via ORAL
  Filled 2022-08-07 (×3): qty 2

## 2022-08-07 MED ORDER — SUGAMMADEX SODIUM 200 MG/2ML IV SOLN
INTRAVENOUS | Status: DC | PRN
  Administered 2022-08-07: 200 mg via INTRAVENOUS

## 2022-08-07 MED ORDER — CHLORHEXIDINE GLUCONATE CLOTH 2 % EX PADS: 6.0000 | MEDICATED_PAD | Freq: Once | CUTANEOUS | Status: DC

## 2022-08-07 MED ORDER — LIDOCAINE HCL (PF) 2 % IJ SOLN
INTRAMUSCULAR | Status: DC | PRN
  Administered 2022-08-07: 1.5 mg/kg/h via INTRADERMAL

## 2022-08-07 MED ORDER — BUPIVACAINE-EPINEPHRINE (PF) 0.25% -1:200000 IJ SOLN
INTRAMUSCULAR | Status: AC
  Filled 2022-08-07: qty 30

## 2022-08-07 MED ORDER — ENSURE MAX PROTEIN PO LIQD
2.0000 [oz_av] | ORAL | Status: DC
  Administered 2022-08-08 – 2022-08-09 (×8): 2 [oz_av] via ORAL

## 2022-08-07 MED ORDER — LACTATED RINGERS IV SOLN: INTRAVENOUS | Status: DC | PRN

## 2022-08-07 MED ORDER — LABETALOL HCL 5 MG/ML IV SOLN
INTRAVENOUS | Status: DC | PRN
  Administered 2022-08-07: 2.5 mg via INTRAVENOUS
  Administered 2022-08-07: 5 mg via INTRAVENOUS
  Administered 2022-08-07: 2.5 mg via INTRAVENOUS
  Administered 2022-08-07: 5 mg via INTRAVENOUS

## 2022-08-07 MED ORDER — ACETAMINOPHEN 500 MG PO TABS
1000.0000 mg | ORAL_TABLET | ORAL | Status: AC
  Administered 2022-08-07: 1000 mg via ORAL
  Filled 2022-08-07: qty 2

## 2022-08-07 MED ORDER — OXYCODONE HCL 5 MG/5ML PO SOLN
5.0000 mg | Freq: Four times a day (QID) | ORAL | Status: DC | PRN
  Administered 2022-08-07 – 2022-08-09 (×6): 5 mg via ORAL
  Filled 2022-08-07 (×6): qty 5

## 2022-08-07 MED ORDER — PROPOFOL 10 MG/ML IV BOLUS
INTRAVENOUS | Status: DC | PRN
  Administered 2022-08-07: 200 mg via INTRAVENOUS

## 2022-08-07 MED ORDER — ACETAMINOPHEN 160 MG/5ML PO SOLN
1000.0000 mg | Freq: Three times a day (TID) | ORAL | Status: DC
  Administered 2022-08-09: 1000 mg via ORAL
  Filled 2022-08-07: qty 40.6

## 2022-08-07 MED ORDER — BUPIVACAINE LIPOSOME 1.3 % IJ SUSP
INTRAMUSCULAR | Status: AC
  Filled 2022-08-07: qty 20

## 2022-08-07 MED ORDER — SIMETHICONE 80 MG PO CHEW
80.0000 mg | CHEWABLE_TABLET | Freq: Four times a day (QID) | ORAL | Status: DC | PRN
  Administered 2022-08-07 – 2022-08-08 (×2): 80 mg via ORAL
  Filled 2022-08-07 (×3): qty 1

## 2022-08-07 MED ORDER — LIDOCAINE HCL 2 % IJ SOLN
INTRAMUSCULAR | Status: AC
  Filled 2022-08-07: qty 40

## 2022-08-07 MED ORDER — STERILE WATER FOR IRRIGATION IR SOLN
Status: DC | PRN
  Administered 2022-08-07: 1000 mL
  Administered 2022-08-07: 500 mL

## 2022-08-07 MED ORDER — SODIUM CHLORIDE 0.9 % IR SOLN
Status: DC | PRN
  Administered 2022-08-07: 1000 mL

## 2022-08-07 MED ORDER — PROMETHAZINE HCL 25 MG/ML IJ SOLN: 6.2500 mg | INTRAMUSCULAR | Status: DC | PRN

## 2022-08-07 MED ORDER — HEPARIN SODIUM (PORCINE) 5000 UNIT/ML IJ SOLN
5000.0000 [IU] | INTRAMUSCULAR | Status: AC
  Administered 2022-08-07: 5000 [IU] via SUBCUTANEOUS
  Filled 2022-08-07: qty 1

## 2022-08-07 MED ORDER — MIDAZOLAM HCL 5 MG/5ML IJ SOLN
INTRAMUSCULAR | Status: DC | PRN
  Administered 2022-08-07: 2 mg via INTRAVENOUS

## 2022-08-07 MED ORDER — MIDAZOLAM HCL 2 MG/2ML IJ SOLN
INTRAMUSCULAR | Status: AC
  Filled 2022-08-07: qty 2

## 2022-08-07 MED ORDER — LACTATED RINGERS IR SOLN
Status: DC | PRN
  Administered 2022-08-07: 1000 mL

## 2022-08-07 MED ORDER — LORATADINE 10 MG PO TABS
10.0000 mg | ORAL_TABLET | Freq: Every evening | ORAL | Status: DC
  Administered 2022-08-07: 10 mg via ORAL
  Filled 2022-08-07 (×3): qty 1

## 2022-08-07 MED ORDER — APREPITANT 40 MG PO CAPS
40.0000 mg | ORAL_CAPSULE | ORAL | Status: AC
  Administered 2022-08-07: 40 mg via ORAL
  Filled 2022-08-07: qty 1

## 2022-08-07 MED ORDER — LABETALOL HCL 5 MG/ML IV SOLN: 5.0000 mg | Freq: Once | INTRAVENOUS | Status: AC | PRN

## 2022-08-07 MED ORDER — ROCURONIUM BROMIDE 100 MG/10ML IV SOLN
INTRAVENOUS | Status: DC | PRN
  Administered 2022-08-07: 20 mg via INTRAVENOUS
  Administered 2022-08-07: 70 mg via INTRAVENOUS
  Administered 2022-08-07: 30 mg via INTRAVENOUS

## 2022-08-07 MED ORDER — DEXAMETHASONE SODIUM PHOSPHATE 10 MG/ML IJ SOLN
INTRAMUSCULAR | Status: AC
  Filled 2022-08-07: qty 1

## 2022-08-07 MED ORDER — FENTANYL CITRATE PF 50 MCG/ML IJ SOSY
25.0000 ug | PREFILLED_SYRINGE | INTRAMUSCULAR | Status: DC | PRN
  Administered 2022-08-07: 25 ug via INTRAVENOUS
  Administered 2022-08-07: 50 ug via INTRAVENOUS

## 2022-08-07 MED ORDER — CHLORHEXIDINE GLUCONATE 0.12 % MT SOLN
15.0000 mL | Freq: Once | OROMUCOSAL | Status: AC
  Administered 2022-08-07: 15 mL via OROMUCOSAL

## 2022-08-07 MED ORDER — BUPIVACAINE LIPOSOME 1.3 % IJ SUSP: 20.0000 mL | Freq: Once | INTRAMUSCULAR | Status: DC

## 2022-08-07 MED ORDER — OXYCODONE HCL 5 MG/5ML PO SOLN: 5.0000 mg | Freq: Once | ORAL | Status: DC | PRN

## 2022-08-07 MED ORDER — FENTANYL CITRATE (PF) 100 MCG/2ML IJ SOLN
INTRAMUSCULAR | Status: DC | PRN
  Administered 2022-08-07: 100 ug via INTRAVENOUS
  Administered 2022-08-07 (×4): 50 ug via INTRAVENOUS

## 2022-08-07 MED ORDER — PROPOFOL 10 MG/ML IV BOLUS
INTRAVENOUS | Status: AC
  Filled 2022-08-07: qty 20

## 2022-08-07 MED ORDER — FENTANYL CITRATE PF 50 MCG/ML IJ SOSY
PREFILLED_SYRINGE | INTRAMUSCULAR | Status: AC
  Administered 2022-08-07: 25 ug via INTRAVENOUS
  Filled 2022-08-07: qty 2

## 2022-08-07 MED ORDER — LIDOCAINE HCL (PF) 2 % IJ SOLN
INTRAMUSCULAR | Status: AC
  Filled 2022-08-07: qty 5

## 2022-08-07 MED ORDER — ORAL CARE MOUTH RINSE: 15.0000 mL | Freq: Once | OROMUCOSAL | Status: AC

## 2022-08-07 MED ORDER — DEXAMETHASONE SODIUM PHOSPHATE 10 MG/ML IJ SOLN
INTRAMUSCULAR | Status: DC | PRN
  Administered 2022-08-07: 8 mg via INTRAVENOUS

## 2022-08-07 MED ORDER — HEPARIN SODIUM (PORCINE) 5000 UNIT/ML IJ SOLN
5000.0000 [IU] | Freq: Three times a day (TID) | INTRAMUSCULAR | Status: DC
  Administered 2022-08-07 – 2022-08-09 (×6): 5000 [IU] via SUBCUTANEOUS
  Filled 2022-08-07 (×6): qty 1

## 2022-08-07 SURGICAL SUPPLY — 96 items
APPLIER CLIP 5 13 M/L LIGAMAX5 (MISCELLANEOUS)
APPLIER CLIP ROT 10 11.4 M/L (STAPLE)
BAG COUNTER SPONGE SURGICOUNT (BAG) ×1 IMPLANT
BLADE SURG SZ11 CARB STEEL (BLADE) ×1 IMPLANT
CABLE HIGH FREQUENCY MONO STRZ (ELECTRODE) ×1 IMPLANT
CANNULA REDUC XI 12-8 STAPL (CANNULA) ×1
CANNULA REDUCER 12-8 DVNC XI (CANNULA) ×1 IMPLANT
CHLORAPREP W/TINT 26 (MISCELLANEOUS) ×2 IMPLANT
CLIP APPLIE 5 13 M/L LIGAMAX5 (MISCELLANEOUS) IMPLANT
CLIP APPLIE ROT 10 11.4 M/L (STAPLE) IMPLANT
COVER SURGICAL LIGHT HANDLE (MISCELLANEOUS) ×1 IMPLANT
COVER TIP SHEARS 8 DVNC (MISCELLANEOUS) ×1 IMPLANT
COVER TIP SHEARS 8MM DA VINCI (MISCELLANEOUS) ×1
CUTTER FLEX LINEAR 45M (STAPLE) ×1 IMPLANT
DERMABOND ADVANCED .7 DNX12 (GAUZE/BANDAGES/DRESSINGS) ×1 IMPLANT
DRAIN CHANNEL 19F RND (DRAIN) IMPLANT
DRAPE ARM DVNC X/XI (DISPOSABLE) ×4 IMPLANT
DRAPE COLUMN DVNC XI (DISPOSABLE) ×1 IMPLANT
DRAPE DA VINCI XI ARM (DISPOSABLE) ×4
DRAPE DA VINCI XI COLUMN (DISPOSABLE) ×1
ELECT REM PT RETURN 15FT ADLT (MISCELLANEOUS) ×1 IMPLANT
ENDOLOOP SUT PDS II  0 18 (SUTURE)
ENDOLOOP SUT PDS II 0 18 (SUTURE) IMPLANT
EVACUATOR SILICONE 100CC (DRAIN) IMPLANT
GAUZE 4X4 16PLY ~~LOC~~+RFID DBL (SPONGE) ×1 IMPLANT
GLOVE BIO SURGEON STRL SZ 6 (GLOVE) ×3 IMPLANT
GLOVE BIO SURGEON STRL SZ7.5 (GLOVE) ×2 IMPLANT
GLOVE INDICATOR 6.5 STRL GRN (GLOVE) ×3 IMPLANT
GLOVE INDICATOR 8.0 STRL GRN (GLOVE) ×2 IMPLANT
GOWN STRL REUS W/ TWL XL LVL3 (GOWN DISPOSABLE) ×3 IMPLANT
GOWN STRL REUS W/TWL XL LVL3 (GOWN DISPOSABLE) ×3
GRASPER SUT TROCAR 14GX15 (MISCELLANEOUS) ×1 IMPLANT
IRRIG SUCT STRYKERFLOW 2 WTIP (MISCELLANEOUS) ×2
IRRIGATION SUCT STRKRFLW 2 WTP (MISCELLANEOUS) ×2 IMPLANT
KIT BASIN OR (CUSTOM PROCEDURE TRAY) ×1 IMPLANT
KIT GASTRIC LAVAGE 34FR ADT (SET/KITS/TRAYS/PACK) ×1 IMPLANT
KIT TURNOVER KIT A (KITS) IMPLANT
LUBRICANT JELLY K Y 4OZ (MISCELLANEOUS) IMPLANT
MARKER SKIN DUAL TIP RULER LAB (MISCELLANEOUS) ×1 IMPLANT
MAT PREVALON FULL STRYKER (MISCELLANEOUS) ×1 IMPLANT
NDL INSUFFLATION 14GA 120MM (NEEDLE) ×1 IMPLANT
NDL SPNL 18GX3.5 QUINCKE PK (NEEDLE) ×1 IMPLANT
NEEDLE INSUFFLATION 14GA 120MM (NEEDLE) ×1 IMPLANT
NEEDLE SPNL 18GX3.5 QUINCKE PK (NEEDLE) ×1 IMPLANT
OBTURATOR OPTICAL STANDARD 8MM (TROCAR) ×1
OBTURATOR OPTICAL STND 8 DVNC (TROCAR) ×1
OBTURATOR OPTICALSTD 8 DVNC (TROCAR) ×1 IMPLANT
PACK CARDIOVASCULAR III (CUSTOM PROCEDURE TRAY) ×1 IMPLANT
PENCIL SMOKE EVACUATOR (MISCELLANEOUS) ×1 IMPLANT
RELOAD 45 VASCULAR/THIN (ENDOMECHANICALS) IMPLANT
RELOAD STAPLE 45 2.5 WHT GRN (ENDOMECHANICALS) IMPLANT
RELOAD STAPLE 45 3.5 BLU ETS (ENDOMECHANICALS) IMPLANT
RELOAD STAPLE 60 2.5 WHT DVNC (STAPLE) ×1 IMPLANT
RELOAD STAPLE 60 3.5 BLU DVNC (STAPLE) IMPLANT
RELOAD STAPLE TA45 3.5 REG BLU (ENDOMECHANICALS) IMPLANT
RELOAD STAPLER 2.5X60 WHT DVNC (STAPLE) ×10 IMPLANT
RELOAD STAPLER 3.5X60 BLU DVNC (STAPLE) IMPLANT
SCISSORS LAP 5X35 DISP (ENDOMECHANICALS) IMPLANT
SEAL CANN UNIV 5-8 DVNC XI (MISCELLANEOUS) ×3 IMPLANT
SEAL XI 5MM-8MM UNIVERSAL (MISCELLANEOUS) ×3
SEALER VESSEL DA VINCI XI (MISCELLANEOUS) ×1
SEALER VESSEL EXT DVNC XI (MISCELLANEOUS) ×1 IMPLANT
SET TUBE SMOKE EVAC HIGH FLOW (TUBING) ×1 IMPLANT
SHEARS HARMONIC ACE PLUS 36CM (ENDOMECHANICALS) ×1 IMPLANT
SLEEVE ADV FIXATION 5X100MM (TROCAR) ×1 IMPLANT
SOL ANTI FOG 6CC (MISCELLANEOUS) ×1 IMPLANT
SOLUTION ANTI FOG 6CC (MISCELLANEOUS) ×1
SOLUTION ELECTROLUBE (MISCELLANEOUS) ×1 IMPLANT
SPIKE FLUID TRANSFER (MISCELLANEOUS) ×1 IMPLANT
STAPLER 60 DA VINCI SURE FORM (STAPLE) ×1
STAPLER 60 SUREFORM DVNC (STAPLE) ×1 IMPLANT
STAPLER CANNULA SEAL DVNC XI (STAPLE) ×1 IMPLANT
STAPLER CANNULA SEAL XI (STAPLE) ×1
STAPLER RELOAD 2.5X60 WHITE (STAPLE) ×10
STAPLER RELOAD 2.5X60 WHT DVNC (STAPLE) ×10
STAPLER RELOAD 3.5X60 BLU DVNC (STAPLE)
STAPLER RELOAD 3.5X60 BLUE (STAPLE)
SUT ETHILON 3 0 PS 1 (SUTURE) IMPLANT
SUT MNCRL AB 4-0 PS2 18 (SUTURE) ×1 IMPLANT
SUT SILK 2 0 SH (SUTURE) ×2 IMPLANT
SUT V-LOC BARB 180 2/0GR6 GS22 (SUTURE) ×2
SUT VIC AB 2-0 SH 27 (SUTURE) ×2
SUT VIC AB 2-0 SH 27XBRD (SUTURE) IMPLANT
SUT VICRYL 0 TIES 12 18 (SUTURE) ×1 IMPLANT
SUT VLOC BARB 180 ABS3/0GR12 (SUTURE) ×2
SUTURE V-LC BRB 180 2/0GR6GS22 (SUTURE) ×2 IMPLANT
SUTURE VLOC BRB 180 ABS3/0GR12 (SUTURE) ×2 IMPLANT
SYR 20ML LL LF (SYRINGE) ×1 IMPLANT
TOWEL OR 17X26 10 PK STRL BLUE (TOWEL DISPOSABLE) ×1 IMPLANT
TRAY FOLEY MTR SLVR 14FR STAT (SET/KITS/TRAYS/PACK) IMPLANT
TRAY FOLEY MTR SLVR 16FR STAT (SET/KITS/TRAYS/PACK) IMPLANT
TRAY LAPAROSCOPIC (CUSTOM PROCEDURE TRAY) ×1 IMPLANT
TROCAR ADV FIXATION 12X100MM (TROCAR) ×1 IMPLANT
TROCAR ADV FIXATION 5X100MM (TROCAR) ×1 IMPLANT
TROCAR Z-THREAD FIOS 5X100MM (TROCAR) ×1 IMPLANT
TUBE CALIBRATION LAPBAND (TUBING) IMPLANT

## 2022-08-07 NOTE — Discharge Instructions (Signed)

## 2022-08-07 NOTE — Op Note (Signed)
Patient: Monica [name] (07-Aug-1976, [id number])  Date of Surgery: 08/07/2022   Preoperative Diagnosis: MORBID OBESITY   Postoperative Diagnosis: MORBID OBESITY   Surgical Procedure:  XI ROBOT ASSISTED GASTRIC BYPASS ROUX-EN-Y WITH UPPER ENDO: 50932 (CPT) HIATAL HERNIA REPAIR:  XI ROBOT ASSISTED APPENDECTOMY:    Operative Team Members:  Surgeon(s) and Role:    * Annslee Tercero, Nickola Major, MD - Primary    * Greer Pickerel, MD - Assistant  Anesthesiologist: Audry Pili, MD CRNA: Victoriano Lain, CRNA; [place] Stephanie C, CRNA; Milford Cage, CRNA   Anesthesia: General   Fluids:  Total I/O In: 1000 [I.Z.:1245] Out: -   Complications: None  Drains:  none   Specimen:  ID Type Source Tests Collected by Time Destination  1 : Appendix Tissue PATH Appendix SURGICAL PATHOLOGY Monica [name], Nickola Major, MD 08/07/2022 1230      Disposition:  PACU - hemodynamically stable.  Plan of Care: Admit to inpatient     Indications for Procedure:  Monica [name] is a 46 y.o. female who was seen as an office consultation for initial bariatric surgery consultation. The patient has morbid obesity with a BMI of Body mass index is 42 kg/m.  We discussed the surgical options to treat obesity and its associated comorbidity. After discussing the available procedures in the region, we discussed in great detail the surgeries I offer: robotic sleeve gastrectomy and robotic roux-en-y gastric bypass. We discussed the procedures themselves as well as their risks, benefits and alternatives. I entered the patient's basic information into the [facility] Metabolic Surgery Risk/Benefit Calculator to facilitate this discussion.   After a full discussion and all questions answered, the patient is interested in pursuing a robotic gastric bypass with upper endoscopy.  She completed the bariatric surgery preoperative pathway to include the following: - Bloodwork - Dietician consult - Chest x-ray - EKG -  Psychology evaluation - Upper endoscopy (hiatal hernia present)   After completing the above workup, Monica [name] was scheduled for a robotic gastric bypass on 08/07/2022. Unfortunately, she presented to the hospital on 07/06/2022 with ruptured appendicitis. A drain was unable to be placed during initial presentation. She was started on antibiotics. Repeat imaging slowly improved, she continued antibiotics till the day of surgery.  We discussed the plan moving forward.  I recommended robotic appendectomy with hiatal hernia repair and gastric bypass with upper endoscopy.  We discussed if the appendectomy was difficult, we may have to do the remainder of the operation another day.    We discussed the risks, benefits and alternatives on multiple occasions and the patient granted consent to proceed.  We will proceed as scheduled.     Findings: Normal appearing appendix, Intra-op GYN consult found no clear abnormality to explain the recent infection.  My assumption is the ruptured appendix had healed over the last month.  Infection status: Patient: Private Patient Elective Case Case: Elective Infection Present At Time Of Surgery (PATOS): Some spillage of foregut  and jejunal contents while creating anastomoses   Description of Procedure:   On the date stated above, the patient was taken to the operating room suite and placed in supine positioning.  General endotracheal anesthesia was induced.  A timeout was completed verifying the correct patient, procedure, positioning and equipment needed for the case.  The patient's abdomen was prepped and draped in the usual sterile fashion.  A 5 mm trocar was used to enter the right upper quadrant using optical technique.  The abdomen was entered safely without any  trauma the underlying viscera.  Three additional incisions were made and 4 robotic trochars were placed across the abdomen, replacing the 5 mm trocar with the 12 mm robotic stapler trocar.  The [facility]  liver retractor was placed through the subxiphoid region and under the left lobe of the liver and was connected to the rail of the bed.  A TAP block was placed using marcaine and Exparel under direct vision of the laparoscope.  The [employer] XI robotic platform was docked and we transitioned to robotic surgery.  First the patient was placed in Trendelenburg and left side down position and the right lower quadrant was inspected.  The anatomy appeared relatively normal with no evidence of ongoing infection.  The appendix was identified and elevated.  The mesoappendix was divided using the vessel sealer.  The appendix was divided off the cecum using a white load of the robotic linear stapler.  The staple line was well formed with good hemostasis.  To ensure there were no other explanations for the patient's recent infection, the GYN team was asked to inspect the pelvic.  Reproductive organs.  They felt there were no clear abnormalities and it would be safe to proceed with gastric bypass.  We then undocked the robot and redocked the robot facing now into the upper abdomen.  The bed was repositioned into a reverse Trendelenburg positioning.  At this point we proceeded with the bariatric operation per the standard fashion.  A hiatal hernia was identified and repaired.    The gastrohepatic ligament was divided and the right crus was identified.  A blunt dissection was carried out between the right crus and the esophagus.  The phrenoesophageal ligament was divided and dissection was carried up over the top of the esophagus towards the left crus.  The fundus was partially mobilized off of the left hemidiaphragm.    All posterior gastric attachments to the lesser sac and retroperitoneum were divided.  The left crus was further delineated.  A retroesophageal window was created and used to for esophageal retraction.    A high, circumferential mediastinal dissection was performed in an effort to mobilize the esophagus  and provide for adequate intraabdominal esophageal length.  The mediastinal dissection was performed bluntly, with little to no thermal energy.  The anterior and posterior vagus nerves were both identified and preserved.  The crural defect was reapproximated with two, interrupted, 0 silk sutures.  The crural pillars came together well without tearing of the adjacent diaphragmatic tissue.    Using the tip up grasper, fenestrated bipolar, 30 degree camera and Vessel Sealer from the patient's right to left, we began by dissecting the angle of His off the left crus of the diaphragm.  The adhesions between the stomach, spleen and diaphragm were divided using the Vessel Sealer to define the angle of His.  I then started 4-6 cm down on the lesser curve of the stomach and created a defect in the gastrohepatic ligament tracking behind the lesser curve of the stomach to enter the lesser sac.  I then used multiple white loads of the robotic 60 mm Sureform linear stapler to form the gastric pouch.  I then directed my attention to the lower abdomen.  The omentum was divided with the Vessel Sealer and I identified the ligament of treitz.  The jejunum was run to a point 50 cm from the ligament of Treitz.  This loop of bowel was then brought into the left upper quadrant, over the transverse colon, between the  split omentum.  A 2-0 v-loc suture was used to create the posterior outer row of the gastrojejunal anastomosis.  An approximately 2 cm gastrotomy was made in the pouch and a matching 2 cm enterotomy was created in the roux limb.  Then, two 3-0 v-loc sutures were used to create a posterior, inner, full thickness layer of the anastomosis.  While sewing the anterior inner layer, the Ewald tube was passed through the anastomosis to ensure patency.  The outer, anterior layer was then created using 2-0 v-loc suture.  A window was made in the jejunal mesentery and the jejunum was divided just proximal to the gastrojejunal  anastomosis using a white load of the robotic 60 mm Sureform linear stapler to divide the roux limb from the hepatobiliary limb.  At this point the gastrojejunal anastomosis was complete and the Ewald tube was removed.  The roux limb was then run 100 cm from the gastrojejunal anastomosis.  This loop of bowel was brought into the left upper quadrant for anastomosis to the hepatobiliary limb.  A robotic 60 mm white load on the Sureform linear stapler was introduced into both limbs and fired to create the jejunojejunostomy.  A second 60 mm white load of the Sureform linear stapler was fired to connect the distal Roux limb to the proximal common channel creating a W shaped anastomosis.  The common enterotomy was closed with a final 60 mm white load of the Sureform linear stapler.  There was bleeding from the staple line so it was oversewn using a 2-0 Vicryl suture.  There was good hemostasis after this oversewing.  The jejunojejunostomy mesenteric defect was closed using running 0-silk suture.   The retro-roux defect was closed, closing the roux limb mesentery to the transverse mesocolon mesentery using a running 0-silk suture.   I ran the roux limb from the gastrojejunal anastomosis to the jejunojejunostomy and found the anatomy as expected without any twisting of the mesentery.  The intra-abdominal pressure was decreased to for the remainder of the case to check for hemostasis.  I then transitioned to endoscopic portion of the procedure.  The adult upper endoscope was inserted into the pouch.  The pouch appeared appropriately sized.  There was good intra-luminal hemostasis.  The endoscope was inserted through the anastomosis into the roux limb.  The anastomosis appeared well formed without any stricture.  The anastomosis was submerged in irrigation in the left upper quadrant and there was no leakage of air bubbles with endoscopic insufflation suggesting a negative leak test and an air tight anastomosis.     The foregut was decompressed with the endoscope and the endoscope was removed.  The robotic instruments were removed and the robot was undocked.  The stapler port in the right abdomen was closed at the fascial level using 0-vicryl on a PMI suture passer.  The liver retractor was removed under direct vision.  The pneumoperitoneum was evacuated.  The skin was closed using 4-0 Monocryl and Dermabond.  All sponge and needle counts were correct at the end of the case.    Ivar Drape, MD General, Bariatric, & Minimally Invasive Surgery [facility], [place]

## 2022-08-07 NOTE — Progress Notes (Signed)

## 2022-08-07 NOTE — Anesthesia Procedure Notes (Signed)
Procedure Name: Intubation Date/Time: 08/07/2022 11:00 AM  Performed by: [place] Manus Rudd, CRNAPre-anesthesia Checklist: Patient identified, Emergency Drugs available, Suction available and Patient being monitored Patient Re-evaluated:Patient Re-evaluated prior to induction Oxygen Delivery Method: Circle system utilized Preoxygenation: Pre-oxygenation with 100% oxygen Induction Type: IV induction Ventilation: Mask ventilation without difficulty Laryngoscope Size: Mac and 3 Grade View: Grade I Tube type: Oral Tube size: 7.0 mm Number of attempts: 1 Airway Equipment and Method: Stylet and Oral airway Placement Confirmation: ETT inserted through vocal cords under direct vision, positive ETCO2 and breath sounds checked- equal and bilateral Secured at: 21 cm Tube secured with: Tape Dental Injury: Teeth and Oropharynx as per pre-operative assessment

## 2022-08-07 NOTE — H&P (Signed)
Admitting Physician: [place]  Service: Bariatric surgery  CC: Obesity, appendicitis  Subjective   HPI: Monica [name] is an 46 y.o. female who is here for robotic hiatal hernia repair with gastric bypass and appendectomy.  Past Medical History:  Diagnosis Date   AMA (advanced maternal age) multigravida 35+    Anxiety    Asthma    Depression    had PP depression   HSV (herpes simplex virus) anogenital infection    Hx of HELLP syndrome, currently pregnant    PONV (postoperative nausea and vomiting)    Vaginal Pap smear, abnormal     Past Surgical History:  Procedure Laterality Date   ADENOIDECTOMY     BIOPSY  05/31/2022   Procedure: BIOPSY;  Surgeon: Felicie Morn, MD;  Location: [facility];  Service: General;;   bone spur removal     CESAREAN SECTION  2013   CESAREAN SECTION N/A 10/20/2017   Procedure: REPEAT CESAREAN SECTION WITH SUPRA CLITORAL SKIN TAG REMOVAL;  Surgeon: Marylynn Pearson, MD;  Location: [place];  Service: Obstetrics;  Laterality: N/A;  Repeat edc 11/05/17 nkda    CESAREAN SECTION WITH BILATERAL TUBAL LIGATION N/A 03/15/2020   Procedure: CESAREAN SECTION WITH BILATERAL TUBAL LIGATION;  Surgeon: Tyson Dense, MD;  Location: [place] [facility];  Service: Obstetrics;  Laterality: N/A;   COLPOSCOPY     ESOPHAGOGASTRODUODENOSCOPY N/A 05/31/2022   Procedure: ESOPHAGOGASTRODUODENOSCOPY (EGD);  Surgeon: Felicie Morn, MD;  Location: [facility];  Service: General;  Laterality: N/A;   tonsil suregery     additional tissue removal   TONSILLECTOMY      Family History  Problem Relation Age of Onset   Hypertension Mother    Hashimoto's thyroiditis Mother    Hypertension Father    Skin cancer Paternal Grandfather    Stroke Paternal Grandfather    Allergic rhinitis Neg Hx    Angioedema Neg Hx    Asthma Neg Hx    Atopy Neg Hx    Eczema Neg Hx    Immunodeficiency Neg Hx    Urticaria Neg Hx     Social:  reports  that she has never smoked. She has never used smokeless tobacco. She reports that she does not drink alcohol and does not use drugs.  Allergies: No Known Allergies  Medications: Current Outpatient Medications  Medication Instructions   amoxicillin-clavulanate (AUGMENTIN) 875-125 MG tablet 1 tablet, Oral, Every 12 hours   Cholecalciferol (VITAMIN D-3 PO) 1 capsule, Oral, Daily   fluticasone (FLONASE) 50 MCG/ACT nasal spray 1-2 sprays, Each Nare, Daily PRN   ibuprofen (ADVIL) 800 mg, Oral, At bedtime PRN   ketotifen (ZADITOR) 0.025 % ophthalmic solution 1 drop, Both Eyes, Daily PRN   levocetirizine (XYZAL) 5 mg, Oral, Every evening   Multiple Vitamins-Minerals (MULTIVITAMIN WITH MINERALS) tablet 1 tablet, Oral, Daily   omeprazole (PRILOSEC) 40 mg, Daily   ondansetron (ZOFRAN-ODT) 4 mg, Oral, Every 6 hours PRN   polyethylene glycol (MIRALAX) 17 g, Oral, Daily PRN   saccharomyces boulardii (FLORASTOR) 250 mg, Oral, 2 times daily, You can find a probiotic over the counter. Recommend taking while you are on antibiotics.   valACYclovir (VALTREX) 500 mg, Oral, Daily    ROS - all of the below systems have been reviewed with the patient and positives are indicated with bold text General: chills, fever or night sweats Eyes: blurry vision or double vision ENT: epistaxis or sore throat Allergy/Immunology: itchy/watery eyes or nasal congestion Hematologic/Lymphatic: bleeding problems, blood  clots or swollen lymph nodes Endocrine: temperature intolerance or unexpected weight changes Breast: new or changing breast lumps or nipple discharge Resp: cough, shortness of breath, or wheezing CV: chest pain or dyspnea on exertion GI: as per HPI GU: dysuria, trouble voiding, or hematuria MSK: joint pain or joint stiffness Neuro: TIA or stroke symptoms Derm: pruritus and skin lesion changes Psych: anxiety and depression  Objective   PE Height 5\' 7"  (1.702 m), weight 122.3 kg, last menstrual period  07/25/2022. Constitutional: NAD; conversant; no deformities Eyes: Moist conjunctiva; no lid lag; anicteric; PERRL Neck: Trachea midline; no thyromegaly Lungs: Normal respiratory effort; no tactile fremitus CV: RRR; no palpable thrills; no pitting edema GI: Abd Soft, nontender; no palpable hepatosplenomegaly MSK: Normal range of motion of extremities; no clubbing/cyanosis Psychiatric: Appropriate affect; alert and oriented x3 Lymphatic: No palpable cervical or axillary lymphadenopathy  Results for orders placed or performed during the hospital encounter of 08/07/22 (from the past 24 hour(s))  Pregnancy, urine POC     Status: None   Collection Time: 08/07/22  8:58 AM  Result Value Ref Range   Preg Test, Ur NEGATIVE NEGATIVE    Imaging Orders  No imaging studies ordered today  EGD 05/31/22 - Gastroesophageal flap valve classified as Hill Grade IV (no fold, wide open lumen, hiatal hernia present). - Normal esophagus. - Gastritis. Biopsied. - Normal.  Assessment and Plan   Monica [name] is a 46 y.o. female who was seen as an office consultation for initial bariatric surgery consultation. The patient has morbid obesity with a BMI of Body mass index is 42 kg/m.  We discussed the surgical options to treat obesity and its associated comorbidity. After discussing the available procedures in the region, we discussed in great detail the surgeries I offer: robotic sleeve gastrectomy and robotic roux-en-y gastric bypass. We discussed the procedures themselves as well as their risks, benefits and alternatives. I entered the patient's basic information into the [facility] Risk/Benefit Calculator to facilitate this discussion.   After a full discussion and all questions answered, the patient is interested in pursuing a robotic gastric bypass with upper endoscopy.  She completed the bariatric surgery preoperative pathway to include the following: - Bloodwork - Dietician consult -  Chest x-ray - EKG - Psychology evaluation - Upper endoscopy (hiatal hernia present)  After completing the above workup, [name] was scheduled for a robotic gastric bypass on 08/07/2022. Unfortunately, she presented to the hospital on 07/06/2022 with ruptured appendicitis. A drain was unable to be placed during initial presentation. She was started on antibiotics. Repeat imaging slowly improved, she continued antibiotics till the day of surgery.  We discussed the plan moving forward.  I recommended robotic appendectomy with hiatal hernia repair and gastric bypass with upper endoscopy.  We discussed if the appendectomy was difficult, we may have to do the remainder of the operation another day.    We discussed the risks, benefits and alternatives on multiple occasions and the patient granted consent to proceed.  We will proceed as scheduled.  Felicie Morn, MD  [facility] Use [email or web address] to contact on call provider

## 2022-08-07 NOTE — Anesthesia Preprocedure Evaluation (Addendum)
Anesthesia Evaluation  Patient identified by MRN, date of birth, ID band Patient awake    Reviewed: Allergy & Precautions, NPO status , Patient's Chart, lab work & pertinent test results  History of Anesthesia Complications (+) PONV and history of anesthetic complications  Airway Mallampati: II  TM Distance: >3 FB Neck ROM: Full    Dental  (+) Dental Advisory Given   Pulmonary asthma ,    Pulmonary exam normal        Cardiovascular negative cardio ROS Normal cardiovascular exam     Neuro/Psych PSYCHIATRIC DISORDERS Anxiety Depression negative neurological ROS     GI/Hepatic Neg liver ROS, GERD  Medicated and Controlled,  Endo/Other  Morbid obesity  Renal/GU negative Renal ROS     Musculoskeletal negative musculoskeletal ROS (+)   Abdominal (+) + obese,   Peds  Hematology negative hematology ROS (+)   Anesthesia Other Findings HSV   Reproductive/Obstetrics  Hx HELLP s/p tubal ligation                             Anesthesia Physical Anesthesia Plan  ASA: 3  Anesthesia Plan: General   Post-op Pain Management: Tylenol PO (pre-op)* and Ketamine IV*   Induction: Intravenous  PONV Risk Score and Plan: 4 or greater and Treatment may vary due to age or medical condition, Ondansetron, Aprepitant, Dexamethasone and Midazolam  Airway Management Planned: Oral ETT  Additional Equipment: None  Intra-op Plan:   Post-operative Plan: Extubation in OR  Informed Consent: I have reviewed the patients History and Physical, chart, labs and discussed the procedure including the risks, benefits and alternatives for the proposed anesthesia with the patient or authorized representative who has indicated his/her understanding and acceptance.     Dental advisory given  Plan Discussed with: CRNA and Anesthesiologist  Anesthesia Plan Comments:        Anesthesia Quick Evaluation

## 2022-08-07 NOTE — Transfer of Care (Signed)
Immediate Anesthesia Transfer of Care Note  Patient: Monica [name]  Procedure(s) Performed: XI ROBOT ASSISTED GASTRIC BYPASS ROUX-EN-Y WITH UPPER ENDO HIATAL HERNIA REPAIR XI [place] ASSISTED APPENDECTOMY  Patient Location: PACU  Anesthesia Type:General  Level of Consciousness: awake and drowsy  Airway & Oxygen Therapy: Patient Spontanous Breathing and Patient connected to face mask oxygen  Post-op Assessment: Report given to RN and Post -op Vital signs reviewed and stable  Post vital signs: Reviewed and stable  Last Vitals:  Vitals Value Taken Time  BP 171/93 08/07/22 1500  Temp    Pulse 93 08/07/22 1502  Resp 25 08/07/22 1502  SpO2 95 % 08/07/22 1502  Vitals shown include unvalidated device data.  Last Pain:  Vitals:   08/07/22 0911  TempSrc: Oral         Complications: No notable events documented.

## 2022-08-07 NOTE — Anesthesia Postprocedure Evaluation (Signed)
Anesthesia Post Note  Patient: Monica [name]  Procedure(s) Performed: XI ROBOT ASSISTED GASTRIC BYPASS ROUX-EN-Y WITH UPPER ENDO HIATAL HERNIA REPAIR XI [place] ASSISTED APPENDECTOMY     Patient location during evaluation: PACU Anesthesia Type: General Level of consciousness: awake and alert Pain management: pain level controlled Vital Signs Assessment: post-procedure vital signs reviewed and stable Respiratory status: spontaneous breathing, nonlabored ventilation, respiratory function stable and patient connected to nasal cannula oxygen Cardiovascular status: blood pressure returned to baseline and stable Postop Assessment: no apparent nausea or vomiting Anesthetic complications: no   No notable events documented.  Last Vitals:  Vitals:   08/07/22 1545 08/07/22 1600  BP: (!) 156/88 (!) 161/97  Pulse: 89 87  Resp: 12 15  Temp:    SpO2: 94% 94%    Last Pain:  Vitals:   08/07/22 1600  TempSrc:   PainSc: Monica [name]

## 2022-08-07 NOTE — Progress Notes (Signed)
PHARMACY CONSULT FOR:  Risk Assessment for Post-Discharge VTE Following Bariatric Surgery  Post-Discharge VTE Risk Assessment: This patient's probability of 30-day post-discharge VTE is increased due to the factors marked:  Sleeve gastrectomy   Liver disorder (transplant, cirrhosis, or nonalcoholic steatohepatitis)   Hx of VTE   Hemorrhage requiring transfusion   GI perforation, leak, or obstruction   ====================================================    Female    Age >/=60 years    BMI >/=50 kg/m2    CHF    Dyspnea at Rest    Paraplegia  x  Non-gastric-band surgery   x Operation Time >/=3 hr    Return to OR     Length of Stay >/= 3 d   Hypercoagulable condition   Significant venous stasis      Predicted probability of 30-day post-discharge VTE: - 0.25%  Other patient-specific factors to consider:   Recommendation for Discharge: No pharmacologic prophylaxis post-discharge        Monica [name] is a 46 y.o. female who underwent robot assisted gastric bypass roux-en-y (procedure) on 08/07/22(date)   Case start: 1131 Case end: 1451   No Known Allergies  Patient Measurements: Height: 5\' 7"  (170.2 cm) Weight: 122.3 kg (269 lb 9.6 oz) IBW/kg (Calculated) : 61.6 Body mass index is 42.23 kg/m.  No results for input(s): "WBC", "HGB", "HCT", "PLT", "APTT", "CREATININE", "LABCREA", "CREAT24HRUR", "MG", "PHOS", "ALBUMIN", "PROT", "AST", "ALT", "ALKPHOS", "BILITOT", "BILIDIR", "IBILI" in the last 72 hours. Estimated Creatinine Clearance: 121.7 mL/min (by C-G formula based on SCr of 0.7 mg/dL).    Past Medical History:  Diagnosis Date   AMA (advanced maternal age) multigravida 35+    Anxiety    Asthma    Depression    had PP depression   HSV (herpes simplex virus) anogenital infection    Hx of HELLP syndrome, currently pregnant    PONV (postoperative nausea and vomiting)    Vaginal Pap smear, abnormal      Medications Prior to Admission  Medication Sig  Dispense Refill Last Dose   amoxicillin-clavulanate (AUGMENTIN) 875-125 MG tablet Take 1 tablet by mouth every 12 (twelve) hours.   08/06/2022   Cholecalciferol (VITAMIN D-3 PO) Take 1 capsule by mouth daily.   Past Week   levocetirizine (XYZAL) 5 MG tablet Take 5 mg by mouth every evening.   Past Week   Multiple Vitamins-Minerals (MULTIVITAMIN WITH MINERALS) tablet Take 1 tablet by mouth daily.   Past Week   polyethylene glycol (MIRALAX) 17 g packet Take 17 g by mouth daily as needed for mild constipation. 14 each 0 Past Week   saccharomyces boulardii (FLORASTOR) 250 MG capsule Take 1 capsule (250 mg total) by mouth 2 (two) times daily. You can find a probiotic over the counter. Recommend taking while you are on antibiotics. (Patient taking differently: Take 250 mg by mouth daily.)   08/06/2022   valACYclovir (VALTREX) 500 MG tablet Take 500 mg by mouth daily.   Past Week   fluticasone (FLONASE) 50 MCG/ACT nasal spray Place 1-2 sprays into both nostrils daily as needed for allergies.   More than a month   ketotifen (ZADITOR) 0.025 % ophthalmic solution Place 1 drop into both eyes daily as needed (itchy eyes).   More than a month   omeprazole (PRILOSEC) 40 MG capsule Take 40 mg by mouth daily. (Patient not taking: Reported on 07/07/2022)      ondansetron (ZOFRAN-ODT) 4 MG disintegrating tablet Take 1 tablet (4 mg total) by mouth every 6 (six) hours as needed  for nausea. (Patient not taking: Reported on 07/27/2022) 15 tablet 0 Not Taking        Royetta Asal, PharmD, BCPS 08/07/2022 6:36 PM

## 2022-08-07 NOTE — Progress Notes (Signed)
Attempted to ambulate patient but patient started dry heaving, Zofran given. Patient sitting in chair with call light within reach.

## 2022-08-08 LAB — CBC WITH DIFFERENTIAL/PLATELET
Abs Immature Granulocytes: 0.03 10*3/uL (ref 0.00–0.07)
Basophils Absolute: 0 10*3/uL (ref 0.0–0.1)
Basophils Relative: 0 %
Eosinophils Absolute: 0 10*3/uL (ref 0.0–0.5)
Eosinophils Relative: 0 %
HCT: 33 % — ABNORMAL LOW (ref 36.0–46.0)
Hemoglobin: 10.7 g/dL — ABNORMAL LOW (ref 12.0–15.0)
Immature Granulocytes: 0 %
Lymphocytes Relative: 17 %
Lymphs Abs: 1.5 10*3/uL (ref 0.7–4.0)
MCH: 27.2 pg (ref 26.0–34.0)
MCHC: 32.4 g/dL (ref 30.0–36.0)
MCV: 83.8 fL (ref 80.0–100.0)
Monocytes Absolute: 0.8 10*3/uL (ref 0.1–1.0)
Monocytes Relative: 10 %
Neutro Abs: 6.1 10*3/uL (ref 1.7–7.7)
Neutrophils Relative %: 73 %
Platelets: 291 10*3/uL (ref 150–400)
RBC: 3.94 MIL/uL (ref 3.87–5.11)
RDW: 13.3 % (ref 11.5–15.5)
WBC: 8.4 10*3/uL (ref 4.0–10.5)
nRBC: 0 % (ref 0.0–0.2)

## 2022-08-08 LAB — CBC
HCT: 31.2 % — ABNORMAL LOW (ref 36.0–46.0)
Hemoglobin: 10 g/dL — ABNORMAL LOW (ref 12.0–15.0)
MCH: 27.2 pg (ref 26.0–34.0)
MCHC: 32.1 g/dL (ref 30.0–36.0)
MCV: 84.8 fL (ref 80.0–100.0)
Platelets: 286 10*3/uL (ref 150–400)
RBC: 3.68 MIL/uL — ABNORMAL LOW (ref 3.87–5.11)
RDW: 13.4 % (ref 11.5–15.5)
WBC: 10 10*3/uL (ref 4.0–10.5)
nRBC: 0 % (ref 0.0–0.2)

## 2022-08-08 LAB — SURGICAL PATHOLOGY

## 2022-08-08 MED ORDER — GUAIFENESIN ER 600 MG PO TB12
600.0000 mg | ORAL_TABLET | Freq: Two times a day (BID) | ORAL | Status: DC
  Administered 2022-08-08: 600 mg via ORAL
  Filled 2022-08-08: qty 1

## 2022-08-08 MED ORDER — GUAIFENESIN 100 MG/5ML PO LIQD
5.0000 mL | ORAL | Status: DC | PRN
  Administered 2022-08-09: 5 mL via ORAL
  Filled 2022-08-08: qty 10

## 2022-08-08 NOTE — Progress Notes (Signed)
Mobility Specialist - Progress Note   08/08/22 0948  Mobility  HOB Elevated/Bed Position Self regulated  Activity Ambulated independently in hallway  Range of Motion/Exercises Active  Level of Assistance Independent  Assistive Device None  Distance Ambulated (ft) 500 ft  Activity Response Tolerated well  $Mobility charge 1 Mobility   Pt was found in bed and agreeable to mobilize. Pt had some abdominal pain from gas while ambulating and at EOS returned to bed with all necessities in reach.  Ferd Hibbs Mobility Specialist

## 2022-08-08 NOTE — Progress Notes (Signed)
Patient alert and oriented, Post op day 1.  Provided support and encouragement.  Encouraged pulmonary toilet, ambulation and small sips of liquids.  Pt currently experiencing significant pain after taking Mucinex prescribed for cough.  Reached out to MD and ordered heating pad. All questions answered.  Will continue to monitor.

## 2022-08-08 NOTE — Progress Notes (Signed)
Progress Note: General Surgery Service   Chief Complaint/Subjective: Doing well POD1 Lots of pain yesterday Nausea not bad  Objective: Vital signs in last 24 hours: Temp:  [97.5 F (36.4 C)-98.6 F (37 C)] 98.3 F (36.8 C) (09/19 0856) Pulse Rate:  [86-103] 92 (09/19 1225) Resp:  [12-20] 18 (09/19 1225) BP: (132-177)/(86-107) 141/88 (09/19 1225) SpO2:  [92 %-98 %] 94 % (09/19 1225) Last BM Date : 08/07/22  Intake/Output from previous day: 09/18 0701 - 09/19 0700 In: 3301.7 [P.O.:600; I.V.:2701.7] Out: 25 [Blood:25] Intake/Output this shift: No intake/output data recorded.  GI: Abd incisions c/d/I w/ glue  Lab Results: CBC  Recent Labs    08/08/22 0435 08/08/22 1152  WBC 8.4 10.0  HGB 10.7* 10.0*  HCT 33.0* 31.2*  PLT 291 286   BMET Recent Labs    08/07/22 1903  CREATININE 0.67   PT/INR No results for input(s): "LABPROT", "INR" in the last 72 hours. ABG No results for input(s): "PHART", "HCO3" in the last 72 hours.  Invalid input(s): "PCO2", "PO2"  Anti-infectives: Anti-infectives (From admission, onward)    Start     Dose/Rate Route Frequency Ordered Stop   08/07/22 0900  cefoTEtan (CEFOTAN) 2 g in sodium chloride 0.9 % 100 mL IVPB        2 g 200 mL/hr over 30 Minutes Intravenous On call to O.R. 08/07/22 0853 08/07/22 1104       Medications: Scheduled Meds:  acetaminophen  1,000 mg Oral Q8H   Or   acetaminophen (TYLENOL) oral liquid 160 mg/5 mL  1,000 mg Oral Q8H   heparin injection (subcutaneous)  5,000 Units Subcutaneous Q8H   loratadine  10 mg Oral QPM   pantoprazole (PROTONIX) IV  40 mg Intravenous QHS   Ensure Max Protein  2 oz Oral Q2H   Continuous Infusions:  lactated ringers 75 mL/hr at 08/08/22 0515   PRN Meds:.fluticasone, guaiFENesin, ketotifen, morphine injection, ondansetron (ZOFRAN) IV, oxyCODONE, simethicone  Assessment/Plan: s/p Procedure(s): XI ROBOT ASSISTED GASTRIC BYPASS ROUX-EN-Y WITH UPPER ENDO HIATAL HERNIA  REPAIR XI ROBOTI ASSISTED APPENDECTOMY 08/07/2022  Doing well POD1 Pain after mucinex pill HGB drop slightly on labwork possibly dilutional   LOS: 1 day     Felicie Morn, MD  [facility] Use [email or web address] to contact on call provider  Daily Billing: [phone] - post op

## 2022-08-09 ENCOUNTER — Other Ambulatory Visit (HOSPITAL_COMMUNITY): Payer: Self-pay

## 2022-08-09 ENCOUNTER — Encounter (HOSPITAL_COMMUNITY): Payer: Self-pay | Admitting: Surgery

## 2022-08-09 LAB — CBC WITH DIFFERENTIAL/PLATELET
Abs Immature Granulocytes: 0.02 10*3/uL (ref 0.00–0.07)
Basophils Absolute: 0 10*3/uL (ref 0.0–0.1)
Basophils Relative: 1 %
Eosinophils Absolute: 0 10*3/uL (ref 0.0–0.5)
Eosinophils Relative: 0 %
HCT: 33.3 % — ABNORMAL LOW (ref 36.0–46.0)
Hemoglobin: 10.7 g/dL — ABNORMAL LOW (ref 12.0–15.0)
Immature Granulocytes: 0 %
Lymphocytes Relative: 29 %
Lymphs Abs: 2.5 10*3/uL (ref 0.7–4.0)
MCH: 27.2 pg (ref 26.0–34.0)
MCHC: 32.1 g/dL (ref 30.0–36.0)
MCV: 84.7 fL (ref 80.0–100.0)
Monocytes Absolute: 0.6 10*3/uL (ref 0.1–1.0)
Monocytes Relative: 7 %
Neutro Abs: 5.4 10*3/uL (ref 1.7–7.7)
Neutrophils Relative %: 63 %
Platelets: 285 10*3/uL (ref 150–400)
RBC: 3.93 MIL/uL (ref 3.87–5.11)
RDW: 13.4 % (ref 11.5–15.5)
WBC: 8.6 10*3/uL (ref 4.0–10.5)
nRBC: 0 % (ref 0.0–0.2)

## 2022-08-09 MED ORDER — OXYCODONE HCL 5 MG PO TABS
5.0000 mg | ORAL_TABLET | Freq: Four times a day (QID) | ORAL | 0 refills | Status: DC | PRN
  Filled 2022-08-09: qty 10, 3d supply, fill #0

## 2022-08-09 MED ORDER — ONDANSETRON 4 MG PO TBDP
4.0000 mg | ORAL_TABLET | Freq: Four times a day (QID) | ORAL | 0 refills | Status: AC | PRN
  Filled 2022-08-09: qty 20, 5d supply, fill #0

## 2022-08-09 MED ORDER — ACETAMINOPHEN 500 MG PO TABS: 1000.0000 mg | ORAL_TABLET | Freq: Three times a day (TID) | ORAL | 0 refills | Status: AC

## 2022-08-09 MED ORDER — GABAPENTIN 100 MG PO CAPS
200.0000 mg | ORAL_CAPSULE | Freq: Two times a day (BID) | ORAL | 0 refills | Status: AC
  Filled 2022-08-09: qty 20, 5d supply, fill #0

## 2022-08-09 MED ORDER — PANTOPRAZOLE SODIUM 40 MG PO TBEC
40.0000 mg | DELAYED_RELEASE_TABLET | Freq: Every day | ORAL | 0 refills | Status: DC
  Filled 2022-08-09: qty 30, 30d supply, fill #0

## 2022-08-09 NOTE — Progress Notes (Signed)
  Transition of Care [facility]) Screening Note   Patient Details  Name: Monica [name] Date of Birth: 1975-12-26   Transition of Care [facility]) CM/SW Contact:    Lennart Pall, LCSW Phone Number: 08/09/2022, 12:15 PM    Transition of Care Department [facility]) has reviewed patient and no TOC needs have been identified at this time. We will continue to monitor patient advancement through interdisciplinary progression rounds. If new patient transition needs arise, please place a TOC consult.

## 2022-08-09 NOTE — Progress Notes (Signed)
Pt states she feels like she is swallowing a bubble of air everytime she takes a sip of fluid and felt like gas keeps shifting over the epigastric area which causes her pain.  Pt walked the hallway with a walker, tolerating well.

## 2022-08-09 NOTE — Progress Notes (Signed)
Mobility Specialist - Progress Note   08/09/22 0913  Mobility  HOB Elevated/Bed Position Self regulated  Activity Ambulated independently in hallway  Range of Motion/Exercises Active  Level of Assistance Independent  Assistive Device None  Distance Ambulated (ft) 500 ft  Activity Response Tolerated well  $Mobility charge 1 Mobility   Pt was found in recliner chair and agreeable to mobilize. Still feeling pain in abdomin when coughing and burping. At EOS returned to recliner chair with all necessities in reach.  Ferd Hibbs Mobility Specialist

## 2022-08-09 NOTE — Progress Notes (Signed)
Pt is feeling much better after receiving medication for cough and pain.  Pt feels confident enough to go home with husband in full support.  Reinforced education about medications, progression and meeting goals. Will continue to f/u with pt as needed.

## 2022-08-09 NOTE — Progress Notes (Signed)
Patient alert and oriented, pain is controlled. Patient is tolerating fluids, advanced to protein shake today, patient is tolerating well. Reviewed Gastric Bypass discharge instructions with patient and patient is able to articulate understanding. Provided information on BELT program, Support Group and [facility] outpatient pharmacy. All questions answered, will continue to monitor.    

## 2022-08-09 NOTE — Discharge Summary (Signed)
Patient ID: Monica [name] [id number] 46 y.o. 05/10/1976  08/07/2022  Discharge date and time: 08/09/2022  Admitting Physician: Hyman Hopes Monica [name]  Discharge Physician: Hyman Hopes Monica [name]  Admission Diagnoses: Morbid obesity with BMI of 40.0-44.9, adult (HCC) [E66.01, Z68.41] Patient Active Problem List   Diagnosis Date Noted   Morbid obesity with BMI of 40.0-44.9, adult (HCC) 08/07/2022   Perforated appendicitis 07/06/2022   Acute appendicitis with perforation, localized peritonitis, and abscess 07/06/2022   Pregnant and not yet delivered in third trimester 03/15/2020   S/P cesarean section 10/20/2017     Discharge Diagnoses: Obesity, ruptured appendicitis Patient Active Problem List   Diagnosis Date Noted   Morbid obesity with BMI of 40.0-44.9, adult (HCC) 08/07/2022   Perforated appendicitis 07/06/2022   Acute appendicitis with perforation, localized peritonitis, and abscess 07/06/2022   Pregnant and not yet delivered in third trimester 03/15/2020   S/P cesarean section 10/20/2017    Operations: Procedure(s): XI ROBOT ASSISTED GASTRIC BYPASS ROUX-EN-Y WITH UPPER ENDO HIATAL HERNIA REPAIR XI ROBOTI ASSISTED APPENDECTOMY  Admission Condition: good  Discharged Condition: good  Indication for Admission: Obesity, ruptured appendicitis  Hospital Course: Monica [name] underwent surgery as above, recovered, and was discharged.  Consults: None  Significant Diagnostic Studies: none  Treatments: surgery: as above  Disposition: Home  Patient Instructions:  Allergies as of 08/09/2022   No Known Allergies      Medication List     TAKE these medications    acetaminophen 500 MG tablet Commonly known as: TYLENOL Take 2 tablets (1,000 mg total) by mouth every 8 (eight) hours for 5 days.   amoxicillin-clavulanate 875-125 MG tablet Commonly known as: AUGMENTIN Take 1 tablet by mouth every 12 (twelve) hours.   fluticasone 50 MCG/ACT nasal spray Commonly known  as: FLONASE Place 1-2 sprays into both nostrils daily as needed for allergies.   gabapentin 100 MG capsule Commonly known as: NEURONTIN Take 2 capsules (200 mg total) by mouth every 12 (twelve) hours.   ketotifen 0.025 % ophthalmic solution Commonly known as: ZADITOR Place 1 drop into both eyes daily as needed (itchy eyes).   levocetirizine 5 MG tablet Commonly known as: XYZAL Take 5 mg by mouth every evening.   multivitamin with minerals tablet Take 1 tablet by mouth daily.   omeprazole 40 MG capsule Commonly known as: PRILOSEC Take 40 mg by mouth daily.   ondansetron 4 MG disintegrating tablet Commonly known as: ZOFRAN-ODT Take 1 tablet (4 mg total) by mouth every 6 (six) hours as needed for nausea. What changed: Another medication with the same name was added. Make sure you understand how and when to take each.   ondansetron 4 MG disintegrating tablet Commonly known as: ZOFRAN-ODT Take 1 tablet (4 mg total) by mouth every 6 (six) hours as needed for nausea or vomiting. What changed: You were already taking a medication with the same name, and this prescription was added. Make sure you understand how and when to take each.   oxyCODONE 5 MG immediate release tablet Commonly known as: Oxy IR/ROXICODONE Take 1 tablet (5 mg total) by mouth every 6 (six) hours as needed for severe pain.   pantoprazole 40 MG tablet Commonly known as: PROTONIX Take 1 tablet (40 mg total) by mouth daily.   polyethylene glycol 17 g packet Commonly known as: MiraLax Take 17 g by mouth daily as needed for mild constipation.   saccharomyces boulardii 250 MG capsule Commonly known as: FLORASTOR Take 1 capsule (250 mg total) by mouth 2 (  two) times daily. You can find a probiotic over the counter. Recommend taking while you are on antibiotics. What changed:  when to take this additional instructions   valACYclovir 500 MG tablet Commonly known as: VALTREX Take 500 mg by mouth daily.   VITAMIN  D-3 PO Take 1 capsule by mouth daily.        Activity: no heavy lifting for 4 weeks Diet:  Bariatric diet protocol Wound Care: keep wound clean and dry  Follow-up:  With Dr. Thermon Leyland  Signed: Nickola [name] Monica [name] General, Bariatric, & Minimally Invasive Surgery [facility], [place]   08/09/2022, 9:01 AM

## 2022-08-10 ENCOUNTER — Other Ambulatory Visit (HOSPITAL_COMMUNITY): Payer: Self-pay

## 2022-08-13 ENCOUNTER — Telehealth (HOSPITAL_COMMUNITY): Payer: Self-pay | Admitting: *Deleted

## 2022-08-13 NOTE — Telephone Encounter (Signed)
1.  Tell me about your pain and pain management? Pt denies any current pain. Pt states that she has been taking mostly the Tylenol, but has taken the pain medication to help her sleep. Pt states that she "felt terrible yesterday, but today is much better".  2.  Let's talk about fluid intake.  How much total fluid are you taking in? Pt states that she is working to meet goal of 64 oz of fluid today.Pt states that she is getting in at least 40oz of fluid including protein shakes, bottled water, Gatorade and jello. Discussed a plan for patient to progress daily with PO intake until goal is reached.  Pt instructed to assess status and suggestions daily utilizing Hydration Action Plan on discharge folder and to call [facility] if in the "red zone".   3.  How much protein have you taken in the last 2 days? Pt states that she is working to meet goal of goal of 60g of protein today. Pt has a plan to meet goal today.  Pt has already consumed one protein shake.  Pt plans to drink remainder of protein throughout the rest of the day to meet goal.  4.  Have you had nausea?  Tell me about when have experienced nausea and what you did to help? Pt denies any current nausea.   5.  Has the frequency or color changed with your urine? Pt states that she is urinating "fine" with no changes in frequency or urgency.     6.  Tell me what your incisions look like? "Incisions look fine". Pt denies a fever, chills.  Pt states incisions are not swollen, open, or draining.  Pt encouraged to call [facility] if incisions change.   7.  Have you been passing gas? BM? Pt states that she has not had a BM.  Pt instructed to take either Miralax or MoM as instructed per "Gastric Bypass/Sleeve Discharge Home Care Instructions".  Pt to call surgeon's office if not able to have BM with medication.   8.  If a problem or question were to arise who would you call?  Do you know contact numbers for [facility], [facility], and [facility]? Pt denies dehydration symptoms.  Pt  can describe s/sx of dehydration.  Pt knows to call [facility] for surgical, [facility] for nutrition, and [facility] for non-urgent questions or concerns.   9.  How has the walking going? Pt states she is walking around and able to be active without difficulty.   10. Are you still using your incentive spirometer?  If so, how often? Pt admits to not doing the I.S. today, but pt is being intentional about performing the lung exercise and TCDB. Instructed pt to perform I.S. at least 10x every hour while awake until she sees the surgeon. Pt states that her cough is "minimal" today.  11.  How are your vitamins and calcium going?  How are you taking them? Pt states that she will begin the supplements tomorrow.  Reinforced education about taking supplements at least two hours apart.  Reminded patient that the first 30 days post-operatively are important for successful recovery.  Practice good hand hygiene, wearing a mask when appropriate (since optional in most places), and minimizing exposure to people who live outside of the home, especially if they are exhibiting any respiratory, GI, or illness-like symptoms.

## 2022-08-22 ENCOUNTER — Encounter: Payer: Commercial Managed Care - PPO | Attending: Surgery | Admitting: Dietician

## 2022-08-22 ENCOUNTER — Encounter: Payer: Self-pay | Admitting: Dietician

## 2022-08-22 DIAGNOSIS — Z713 Dietary counseling and surveillance: Secondary | ICD-10-CM | POA: Insufficient documentation

## 2022-08-22 DIAGNOSIS — Z6839 Body mass index (BMI) 39.0-39.9, adult: Secondary | ICD-10-CM | POA: Insufficient documentation

## 2022-08-22 DIAGNOSIS — E669 Obesity, unspecified: Secondary | ICD-10-CM | POA: Insufficient documentation

## 2022-08-22 NOTE — Progress Notes (Signed)
2 Week Post-Operative Nutrition Class   Patient was seen on 08/22/2022 for Post-Operative Nutrition education at the [facility].    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 BMI: 39.20 kg/m2     Clinical  Medical hx: post partum depression, anxiety  Medications: see list: multivitamin   Labs: cholesterol 204, LDL 128, Iron 12, vitamin D 28.7 Notable signs/symptoms: N/A Any previous deficiencies? No Bowel Habits: Every day to every other day no complaints   Body Composition Scale 08/22/2022  Current Body Weight 257.8  Total Body Fat % 43.6  Visceral Fat 14  Fat-Free Mass % 56.3   Total Body Water % 42.6  Muscle-Mass lbs 34.9  BMI 39.2  Body Fat Displacement          Torso  lbs 69.8         Left Leg  lbs 13.9         Right Leg  lbs 13.9         Left Arm  lbs 6.9         Right Arm   lbs 6.9      The following the learning objectives were met by the patient during this course: Identifies Phase 3 (Soft, High Proteins) Dietary Goals and will begin from 2 weeks post-operatively to 2 months post-operatively Identifies appropriate sources of fluids and proteins  Identifies appropriate fat sources and healthy verses unhealthy fat types   States protein recommendations and appropriate sources post-operatively Identifies the need for appropriate texture modifications, mastication, and bite sizes when consuming solids Identifies appropriate fat consumption and sources Identifies appropriate multivitamin and calcium sources post-operatively Describes the need for physical activity post-operatively and will follow MD recommendations States when to call healthcare provider regarding medication questions or post-operative complications   Handouts given during class include: Phase 3A: Soft, High Protein Diet  Handout Phase 3 High Protein Meals Healthy Fats   Follow-Up Plan: Patient will follow-up at [facility] in 6 weeks for 2 month post-op nutrition visit for diet advancement per MD.

## 2022-08-28 ENCOUNTER — Telehealth: Payer: Self-pay | Admitting: Dietician

## 2022-08-28 NOTE — Telephone Encounter (Signed)
RD called pt to verify fluid intake once starting soft, solid proteins 2 week post-bariatric surgery.   Daily Fluid intake:  Daily Protein intake:  Bowel Habits:   Concerns/issues:   Left voice message 

## 2022-09-28 ENCOUNTER — Other Ambulatory Visit (HOSPITAL_COMMUNITY): Payer: Self-pay

## 2022-10-03 ENCOUNTER — Encounter: Payer: Self-pay | Admitting: Skilled Nursing Facility1

## 2022-10-03 ENCOUNTER — Encounter: Payer: Commercial Managed Care - PPO | Attending: Surgery | Admitting: Skilled Nursing Facility1

## 2022-10-03 VITALS — Ht 68.0 in | Wt 236.5 lb

## 2022-10-03 DIAGNOSIS — E669 Obesity, unspecified: Secondary | ICD-10-CM | POA: Diagnosis not present

## 2022-10-03 NOTE — Progress Notes (Signed)
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 No   Body Composition Scale 08/22/2022 10/03/2022  Current Body Weight 257.8 236.5  Total Body Fat % 43.6 41.3  Visceral Fat 14 12  Fat-Free Mass % 56.3 58.6   Total Body Water % 42.6 43.8  Muscle-Mass lbs 34.9 34.7  BMI 39.2 35.9  Body Fat Displacement           Torso  lbs 69.8 60.5         Left Leg  lbs 13.9 12.1         Right Leg  lbs 13.9 12.1         Left Arm  lbs 6.9 6.0         Right Arm   lbs 6.9 6.0     Lifestyle & Dietary Hx  Pt states she has had some functional issues with swallowing but has been reaching her protein recommendations with gurgling and bubbling coming up.   Estimated daily fluid intake: 45-50 oz Estimated daily protein intake: 80 g Supplements: multi and calcium  Current average weekly physical activity: ADL's   24-Hr Dietary Recall First Meal: coffee + protein shake Snack: protein shake  Second Meal: provolone  Snack:   Third Meal: black beans or yogurt + pumpkin seeds Snack:  Beverages: hot tea, water + falvoring  Post-Op Goals/ Signs/ Symptoms Using straws: no Drinking while eating: no Chewing/swallowing difficulties: no Changes in vision: no Changes to mood/headaches: no Hair loss/changes to skin/nails: no Difficulty focusing/concentrating: no Sweating: no Limb weakness: no Dizziness/lightheadedness: no Palpitations: no  Carbonated/caffeinated beverages: no N/V/D/C/Gas: no Abdominal pain: no Dumping syndrome: no    NUTRITION DIAGNOSIS  Overweight/obesity ([place]-3.3) related to  past poor dietary habits and physical inactivity as evidenced by completed bariatric surgery and following dietary guidelines for continued weight loss and healthy nutrition status.     NUTRITION INTERVENTION Nutrition counseling (C-1) and education (E-2) to facilitate bariatric surgery goals, including: The importance of consuming adequate calories as well as certain nutrients daily due to the body's need for essential vitamins, minerals, and fats The importance of daily physical activity and to reach a goal of at least 150 minutes of moderate to vigorous physical activity weekly (or as directed by their physician) due to benefits such as increased musculature and improved lab values The importance of intuitive eating specifically learning hunger-satiety cues and understanding the importance of learning a new body: The importance of mindful eating to avoid grazing behaviors  Encouraged patient to honor their body's internal hunger and fullness cues.  Throughout the day, check in mentally and rate hunger. Stop eating when satisfied not full regardless of how much food is left on the plate.  Get more if still hungry 20-30 minutes later.  The key is to honor satisfaction so throughout the meal, rate fullness factor and stop when comfortably satisfied not physically full. The key is to honor hunger and fullness without any feelings of guilt or shame.  Pay attention to what the internal cues are, rather than any external factors. This will enhance the confidence you have in listening to your own body and  following those internal cues enabling you to increase how often you eat when you are hungry not out of appetite and stop when you are satisfied not full.  Encouraged pt to continue to eat balanced meals inclusive of non starchy vegetables 2 times a day 7 days a week Encouraged pt to choose lean protein sources: limiting beef, pork, sausage, hotdogs, and lunch meat Encourage pt to choose healthy fats such as  plant based limiting animal fats Encouraged pt to continue to drink a minium 64 fluid ounces with half being plain water to satisfy proper hydration   Handouts Provided Include  Tofu recipe  Learning Style & Readiness for Change Teaching method utilized: Visual & Auditory  Demonstrated degree of understanding via: Teach Back  Readiness Level: action Barriers to learning/adherence to lifestyle change: none identified   RD's Notes for Next Visit Assess adherence to pt chosen goals     MONITORING & EVALUATION Dietary intake, weekly physical activity, body weight  Next Steps Patient is to follow-up in 3 months

## 2023-01-22 ENCOUNTER — Ambulatory Visit: Payer: Self-pay | Admitting: Surgery

## 2023-01-22 NOTE — Progress Notes (Signed)
Sent message, via epic in basket, requesting orders in epic from surgeon.  

## 2023-01-23 NOTE — Patient Instructions (Signed)
SURGICAL WAITING ROOM VISITATION  Patients having surgery or a procedure may have no more than 2 support people in the waiting area - these visitors may rotate.    Children under the age of 38 must have an adult with them who is not the patient.  Due to an increase in RSV and influenza rates and associated hospitalizations, children ages 68 and under may not visit patients in [place].  If the patient needs to stay at the hospital during part of their recovery, the visitor guidelines for inpatient rooms apply. Pre-op nurse will coordinate an appropriate time for 1 support person to accompany patient in pre-op.  This support person may not rotate.    Please refer to the [facility] website for the visitor guidelines for Inpatients (after your surgery is over and you are in a regular room).    Your procedure is scheduled on: 01/25/23   Report to [facility] Main Entrance    Report to admitting at 5:15 AM   Call this number if you have problems the morning of surgery [phone]   Do not eat food :After Midnight.   After Midnight you may have the following liquids until 4:30 AM DAY OF SURGERY  Water Non-Citrus Juices (without pulp, NO RED-Apple, White grape, White cranberry) Black Coffee (NO MILK/CREAM OR CREAMERS, sugar ok)  Clear Tea (NO MILK/CREAM OR CREAMERS, sugar ok) regular and decaf                             Plain Jell-O (NO RED)                                           Fruit ices (not with fruit pulp, NO RED)                                     Popsicles (NO RED)                                                               Sports drinks like Gatorade (NO RED)                      If you have questions, please contact your surgeon's office.   FOLLOW BOWEL PREP AND ANY ADDITIONAL PRE OP INSTRUCTIONS YOU RECEIVED FROM YOUR SURGEON'S OFFICE!!!     Oral Hygiene is also important to reduce your risk of infection.                                     Remember - BRUSH YOUR TEETH THE MORNING OF SURGERY WITH YOUR REGULAR TOOTHPASTE  DENTURES WILL BE REMOVED PRIOR TO SURGERY PLEASE DO NOT APPLY "Poly grip" OR ADHESIVES!!!   Take these medicines the morning of surgery with A SIP OF WATER: Flonase, Gabapentin, Zofran  You may not have any metal on your body including hair pins, jewelry, and body piercing             Do not wear make-up, lotions, powders, perfumes, or deodorant  Do not wear nail polish including gel and S&S, artificial/acrylic nails, or any other type of covering on natural nails including finger and toenails. If you have artificial nails, gel coating, etc. that needs to be removed by a nail salon please have this removed prior to surgery or surgery may need to be canceled/ delayed if the surgeon/ anesthesia feels like they are unable to be safely monitored.   Do not shave  48 hours prior to surgery.    Do not bring valuables to the hospital. [place].   Contacts, glasses, dentures or bridgework may not be worn into surgery.  DO NOT [place]. PHARMACY WILL DISPENSE MEDICATIONS LISTED ON YOUR MEDICATION LIST TO YOU DURING YOUR ADMISSION [place]!    Patients discharged on the day of surgery will not be allowed to drive home.  Someone NEEDS to stay with you for the first 24 hours after anesthesia.               Please read over the following fact sheets you were given: IF [place] [phone] Schneiders    If you received a COVID test during your pre-op visit  it is requested that you wear a mask when out in public, stay away from anyone that may not be feeling well and notify your surgeon if you develop symptoms. If you test positive for Covid or have been in contact with anyone that has tested positive in the last 10 days please notify you surgeon.     [place] - Preparing for Surgery Before surgery, you can play an important role.  Because skin is not sterile, your skin needs to be as free of germs as possible.  You can reduce the number of germs on your skin by washing with CHG (chlorahexidine gluconate) soap before surgery.  CHG is an antiseptic cleaner which kills germs and bonds with the skin to continue killing germs even after washing. Please DO NOT use if you have an allergy to CHG or antibacterial soaps.  If your skin becomes reddened/irritated stop using the CHG and inform your nurse when you arrive at Short Stay. Do not shave (including legs and underarms) for at least 48 hours prior to the first CHG shower.  You may shave your face/neck.  Please follow these instructions carefully:  1.  Shower with CHG Soap the night before surgery and the  morning of surgery.  2.  If you choose to wash your hair, wash your hair first as usual with your normal  shampoo.  3.  After you shampoo, rinse your hair and body thoroughly to remove the shampoo.                             4.  Use CHG as you would any other liquid soap.  You can apply chg directly to the skin and wash.  Gently with a scrungie or clean washcloth.  5.  Apply the CHG Soap to your body ONLY FROM THE NECK DOWN.   Do   not use  on face/ open                           Wound or open sores. Avoid contact with eyes, ears mouth and   genitals (private parts).                       Wash face,  Genitals (private parts) with your normal soap.             6.  Wash thoroughly, paying special attention to the area where your    surgery  will be performed.  7.  Thoroughly rinse your body with warm water from the neck down.  8.  DO NOT shower/wash with your normal soap after using and rinsing off the CHG Soap.                9.  Pat yourself dry with a clean towel.            10.  Wear clean pajamas.            11.  Place clean sheets on your bed the night of your first shower and do not  sleep  with pets. Day of Surgery : Do not apply any lotions/deodorants the morning of surgery.  Please wear clean clothes to the hospital/surgery center.  FAILURE TO FOLLOW THESE INSTRUCTIONS MAY RESULT IN THE CANCELLATION OF YOUR SURGERY  PATIENT SIGNATURE_________________________________  NURSE SIGNATURE__________________________________  ________________________________________________________________________

## 2023-01-23 NOTE — Progress Notes (Signed)
COVID Vaccine Completed:  Date of COVID positive in last 90 days:  PCP -  Cardiologist -   Chest x-ray - 03/08/22 Epic EKG - 03/07/22 Epic Stress Test -  ECHO -  Cardiac Cath -  Pacemaker/ICD device last checked: Spinal Cord Stimulator:  Bowel Prep -   Sleep Study -  CPAP -   Fasting Blood Sugar -  Checks Blood Sugar _____ times a day  Last dose of GLP1 agonist-  N/A GLP1 instructions:  N/A   Last dose of SGLT-2 inhibitors-  N/A SGLT-2 instructions: N/A   Blood Thinner Instructions: Aspirin Instructions: Last Dose:  Activity level:  Can go up a flight of stairs and perform activities of daily living without stopping and without symptoms of chest pain or shortness of breath.  Able to exercise without symptoms  Unable to go up a flight of stairs without symptoms of     Anesthesia review:   Patient denies shortness of breath, fever, cough and chest pain at PAT appointment  Patient verbalized understanding of instructions that were given to them at the PAT appointment. Patient was also instructed that they will need to review over the PAT instructions again at home before surgery.

## 2023-01-24 ENCOUNTER — Encounter (HOSPITAL_COMMUNITY): Payer: Self-pay

## 2023-01-24 ENCOUNTER — Encounter (HOSPITAL_COMMUNITY)
Admission: RE | Admit: 2023-01-24 | Discharge: 2023-01-24 | Disposition: A | Discharge status: Home / Self Care | Payer: Commercial Managed Care - PPO | Source: Ambulatory Visit | Attending: Surgery | Admitting: Surgery

## 2023-01-24 VITALS — BP 126/76 | HR 85 | Temp 98.4°F | Resp 12 | Ht 68.0 in | Wt 199.0 lb

## 2023-01-24 DIAGNOSIS — Z01818 Encounter for other preprocedural examination: Secondary | ICD-10-CM

## 2023-01-24 DIAGNOSIS — K9189 Other postprocedural complications and disorders of digestive system: Secondary | ICD-10-CM | POA: Diagnosis not present

## 2023-01-24 DIAGNOSIS — Z01812 Encounter for preprocedural laboratory examination: Secondary | ICD-10-CM | POA: Insufficient documentation

## 2023-01-24 LAB — CBC
HCT: 37.3 % (ref 36.0–46.0)
Hemoglobin: 11.9 g/dL — ABNORMAL LOW (ref 12.0–15.0)
MCH: 27.6 pg (ref 26.0–34.0)
MCHC: 31.9 g/dL (ref 30.0–36.0)
MCV: 86.5 fL (ref 80.0–100.0)
Platelets: 293 K/uL (ref 150–400)
RBC: 4.31 MIL/uL (ref 3.87–5.11)
RDW: 14.4 % (ref 11.5–15.5)
WBC: 8.3 K/uL (ref 4.0–10.5)
nRBC: 0 % (ref 0.0–0.2)

## 2023-01-25 ENCOUNTER — Ambulatory Visit (HOSPITAL_BASED_OUTPATIENT_CLINIC_OR_DEPARTMENT_OTHER): Payer: Commercial Managed Care - PPO | Admitting: Certified Registered Nurse Anesthetist

## 2023-01-25 ENCOUNTER — Ambulatory Visit (HOSPITAL_COMMUNITY): Payer: Commercial Managed Care - PPO | Admitting: Certified Registered Nurse Anesthetist

## 2023-01-25 ENCOUNTER — Ambulatory Visit (HOSPITAL_COMMUNITY)
Admission: RE | Admit: 2023-01-25 | Discharge: 2023-01-25 | Disposition: A | Discharge status: Home / Self Care | Payer: Commercial Managed Care - PPO | Source: Home / Self Care | Attending: Surgery | Admitting: Surgery

## 2023-01-25 ENCOUNTER — Encounter (HOSPITAL_COMMUNITY)
Admission: RE | Disposition: A | Discharge status: Home / Self Care | Payer: Self-pay | Source: Home / Self Care | Attending: Surgery

## 2023-01-25 ENCOUNTER — Other Ambulatory Visit: Payer: Self-pay

## 2023-01-25 ENCOUNTER — Ambulatory Visit (HOSPITAL_COMMUNITY): Payer: Commercial Managed Care - PPO

## 2023-01-25 ENCOUNTER — Encounter (HOSPITAL_COMMUNITY): Payer: Self-pay | Admitting: Surgery

## 2023-01-25 DIAGNOSIS — K819 Cholecystitis, unspecified: Secondary | ICD-10-CM

## 2023-01-25 DIAGNOSIS — Z01818 Encounter for other preprocedural examination: Secondary | ICD-10-CM

## 2023-01-25 DIAGNOSIS — K828 Other specified diseases of gallbladder: Secondary | ICD-10-CM | POA: Insufficient documentation

## 2023-01-25 DIAGNOSIS — K8010 Calculus of gallbladder with chronic cholecystitis without obstruction: Secondary | ICD-10-CM | POA: Insufficient documentation

## 2023-01-25 DIAGNOSIS — Z9884 Bariatric surgery status: Secondary | ICD-10-CM | POA: Insufficient documentation

## 2023-01-25 HISTORY — PX: CHOLECYSTECTOMY: SHX55

## 2023-01-25 LAB — POCT PREGNANCY, URINE: Preg Test, Ur: NEGATIVE

## 2023-01-25 SURGERY — LAPAROSCOPIC CHOLECYSTECTOMY WITH INTRAOPERATIVE CHOLANGIOGRAM
Anesthesia: General

## 2023-01-25 MED ORDER — PHENYLEPHRINE HCL-NACL 20-0.9 MG/250ML-% IV SOLN
INTRAVENOUS | Status: AC
  Filled 2023-01-25: qty 250

## 2023-01-25 MED ORDER — 0.9 % SODIUM CHLORIDE (POUR BTL) OPTIME
TOPICAL | Status: DC | PRN
  Administered 2023-01-25: 1000 mL

## 2023-01-25 MED ORDER — FENTANYL CITRATE (PF) 100 MCG/2ML IJ SOLN
INTRAMUSCULAR | Status: AC
  Filled 2023-01-25: qty 2

## 2023-01-25 MED ORDER — PROPOFOL 10 MG/ML IV BOLUS
INTRAVENOUS | Status: DC | PRN
  Administered 2023-01-25: 200 mg via INTRAVENOUS

## 2023-01-25 MED ORDER — LIDOCAINE 2% (20 MG/ML) 5 ML SYRINGE
INTRAMUSCULAR | Status: DC | PRN
  Administered 2023-01-25: 100 mg via INTRAVENOUS

## 2023-01-25 MED ORDER — KETOROLAC TROMETHAMINE 30 MG/ML IJ SOLN
30.0000 mg | Freq: Once | INTRAMUSCULAR | Status: AC | PRN
  Administered 2023-01-25: 30 mg via INTRAVENOUS

## 2023-01-25 MED ORDER — CHLORHEXIDINE GLUCONATE CLOTH 2 % EX PADS: 6.0000 | MEDICATED_PAD | Freq: Once | CUTANEOUS | Status: DC

## 2023-01-25 MED ORDER — SCOPOLAMINE 1 MG/3DAYS TD PT72
MEDICATED_PATCH | TRANSDERMAL | Status: AC
  Filled 2023-01-25: qty 1

## 2023-01-25 MED ORDER — AMISULPRIDE (ANTIEMETIC) 5 MG/2ML IV SOLN
INTRAVENOUS | Status: AC
  Filled 2023-01-25: qty 4

## 2023-01-25 MED ORDER — SCOPOLAMINE 1 MG/3DAYS TD PT72
MEDICATED_PATCH | TRANSDERMAL | Status: DC | PRN
  Administered 2023-01-25: 1 via TRANSDERMAL

## 2023-01-25 MED ORDER — ONDANSETRON HCL 4 MG/2ML IJ SOLN
INTRAMUSCULAR | Status: DC | PRN
  Administered 2023-01-25: 4 mg via INTRAVENOUS

## 2023-01-25 MED ORDER — ROCURONIUM BROMIDE 10 MG/ML (PF) SYRINGE
PREFILLED_SYRINGE | INTRAVENOUS | Status: DC | PRN
  Administered 2023-01-25: 20 mg via INTRAVENOUS
  Administered 2023-01-25: 10 mg via INTRAVENOUS
  Administered 2023-01-25: 60 mg via INTRAVENOUS

## 2023-01-25 MED ORDER — DEXAMETHASONE SODIUM PHOSPHATE 10 MG/ML IJ SOLN
INTRAMUSCULAR | Status: DC | PRN
  Administered 2023-01-25: 10 mg via INTRAVENOUS

## 2023-01-25 MED ORDER — ONDANSETRON HCL 4 MG/2ML IJ SOLN
INTRAMUSCULAR | Status: AC
  Filled 2023-01-25: qty 2

## 2023-01-25 MED ORDER — DEXAMETHASONE SODIUM PHOSPHATE 10 MG/ML IJ SOLN
INTRAMUSCULAR | Status: AC
  Filled 2023-01-25: qty 1

## 2023-01-25 MED ORDER — BUPIVACAINE-EPINEPHRINE 0.25% -1:200000 IJ SOLN
INTRAMUSCULAR | Status: DC | PRN
  Administered 2023-01-25: 30 mL

## 2023-01-25 MED ORDER — ONDANSETRON HCL 4 MG/2ML IJ SOLN
4.0000 mg | Freq: Once | INTRAMUSCULAR | Status: AC | PRN
  Administered 2023-01-25: 4 mg via INTRAVENOUS

## 2023-01-25 MED ORDER — CEFAZOLIN SODIUM-DEXTROSE 2-4 GM/100ML-% IV SOLN
2.0000 g | INTRAVENOUS | Status: AC
  Administered 2023-01-25: 2 g via INTRAVENOUS
  Filled 2023-01-25: qty 100

## 2023-01-25 MED ORDER — KETOROLAC TROMETHAMINE 30 MG/ML IJ SOLN
INTRAMUSCULAR | Status: AC
  Filled 2023-01-25: qty 1

## 2023-01-25 MED ORDER — LACTATED RINGERS IR SOLN
Status: DC | PRN
  Administered 2023-01-25: 1000 mL

## 2023-01-25 MED ORDER — FENTANYL CITRATE PF 50 MCG/ML IJ SOSY
PREFILLED_SYRINGE | INTRAMUSCULAR | Status: AC
  Administered 2023-01-25: 25 ug via INTRAVENOUS
  Filled 2023-01-25: qty 1

## 2023-01-25 MED ORDER — OXYCODONE-ACETAMINOPHEN 5-325 MG PO TABS: 1.0000 | ORAL_TABLET | ORAL | 0 refills | Status: DC | PRN

## 2023-01-25 MED ORDER — HEMOSTATIC AGENTS (NO CHARGE) OPTIME
TOPICAL | Status: DC | PRN
  Administered 2023-01-25: 1 via TOPICAL

## 2023-01-25 MED ORDER — BUPIVACAINE LIPOSOME 1.3 % IJ SUSP: 20.0000 mL | Freq: Once | INTRAMUSCULAR | Status: DC

## 2023-01-25 MED ORDER — ENOXAPARIN SODIUM 40 MG/0.4ML IJ SOSY
40.0000 mg | PREFILLED_SYRINGE | Freq: Once | INTRAMUSCULAR | Status: AC
  Administered 2023-01-25: 40 mg via SUBCUTANEOUS
  Filled 2023-01-25: qty 0.4

## 2023-01-25 MED ORDER — IOHEXOL 300 MG/ML  SOLN
INTRAMUSCULAR | Status: DC | PRN
  Administered 2023-01-25: 10 mL via INTRATHECAL

## 2023-01-25 MED ORDER — OXYCODONE HCL 5 MG PO TABS
5.0000 mg | ORAL_TABLET | Freq: Once | ORAL | Status: AC | PRN
  Administered 2023-01-25: 5 mg via ORAL

## 2023-01-25 MED ORDER — AMISULPRIDE (ANTIEMETIC) 5 MG/2ML IV SOLN
10.0000 mg | Freq: Once | INTRAVENOUS | Status: AC
  Administered 2023-01-25: 10 mg via INTRAVENOUS

## 2023-01-25 MED ORDER — CHLORHEXIDINE GLUCONATE 0.12 % MT SOLN
15.0000 mL | Freq: Once | OROMUCOSAL | Status: AC
  Administered 2023-01-25: 15 mL via OROMUCOSAL

## 2023-01-25 MED ORDER — PROPOFOL 10 MG/ML IV BOLUS
INTRAVENOUS | Status: AC
  Filled 2023-01-25: qty 20

## 2023-01-25 MED ORDER — LACTATED RINGERS IV SOLN: INTRAVENOUS | Status: DC

## 2023-01-25 MED ORDER — MIDAZOLAM HCL 5 MG/5ML IJ SOLN
INTRAMUSCULAR | Status: DC | PRN
  Administered 2023-01-25: 2 mg via INTRAVENOUS

## 2023-01-25 MED ORDER — FENTANYL CITRATE PF 50 MCG/ML IJ SOSY
25.0000 ug | PREFILLED_SYRINGE | INTRAMUSCULAR | Status: DC | PRN
  Administered 2023-01-25: 25 ug via INTRAVENOUS

## 2023-01-25 MED ORDER — LIDOCAINE HCL (PF) 2 % IJ SOLN
INTRAMUSCULAR | Status: AC
  Filled 2023-01-25: qty 5

## 2023-01-25 MED ORDER — MIDAZOLAM HCL 2 MG/2ML IJ SOLN
INTRAMUSCULAR | Status: AC
  Filled 2023-01-25: qty 2

## 2023-01-25 MED ORDER — ACETAMINOPHEN 500 MG PO TABS
1000.0000 mg | ORAL_TABLET | ORAL | Status: AC
  Administered 2023-01-25: 1000 mg via ORAL
  Filled 2023-01-25: qty 2

## 2023-01-25 MED ORDER — SUGAMMADEX SODIUM 500 MG/5ML IV SOLN
INTRAVENOUS | Status: DC | PRN
  Administered 2023-01-25: 200 mg via INTRAVENOUS

## 2023-01-25 MED ORDER — OXYCODONE HCL 5 MG/5ML PO SOLN: 5.0000 mg | Freq: Once | ORAL | Status: AC | PRN

## 2023-01-25 MED ORDER — FENTANYL CITRATE (PF) 100 MCG/2ML IJ SOLN
INTRAMUSCULAR | Status: DC | PRN
  Administered 2023-01-25 (×6): 50 ug via INTRAVENOUS

## 2023-01-25 MED ORDER — BUPIVACAINE-EPINEPHRINE (PF) 0.25% -1:200000 IJ SOLN
INTRAMUSCULAR | Status: AC
  Filled 2023-01-25: qty 30

## 2023-01-25 MED ORDER — DROPERIDOL 2.5 MG/ML IJ SOLN
INTRAMUSCULAR | Status: DC | PRN
  Administered 2023-01-25: .625 mg via INTRAVENOUS

## 2023-01-25 MED ORDER — OXYCODONE HCL 5 MG PO TABS
ORAL_TABLET | ORAL | Status: AC
  Filled 2023-01-25: qty 1

## 2023-01-25 MED ORDER — DROPERIDOL 2.5 MG/ML IJ SOLN
INTRAMUSCULAR | Status: AC
  Filled 2023-01-25: qty 2

## 2023-01-25 MED ORDER — ROCURONIUM BROMIDE 10 MG/ML (PF) SYRINGE
PREFILLED_SYRINGE | INTRAVENOUS | Status: AC
  Filled 2023-01-25: qty 10

## 2023-01-25 MED ORDER — ORAL CARE MOUTH RINSE: 15.0000 mL | Freq: Once | OROMUCOSAL | Status: AC

## 2023-01-25 SURGICAL SUPPLY — 45 items
ADH SKN CLS APL DERMABOND .7 (GAUZE/BANDAGES/DRESSINGS) ×1
APL PRP STRL LF DISP 70% ISPRP (MISCELLANEOUS) ×1
APPLIER CLIP ROT 10 11.4 M/L (STAPLE) ×1
APR CLP MED LRG 11.4X10 (STAPLE) ×1
BAG COUNTER SPONGE SURGICOUNT (BAG) IMPLANT
BAG SPEC RTRVL 10 TROC 200 (ENDOMECHANICALS) ×1
BAG SPNG CNTER NS LX DISP (BAG)
CABLE HIGH FREQUENCY MONO STRZ (ELECTRODE) ×1 IMPLANT
CATH URETL OPEN 5X70 (CATHETERS) IMPLANT
CHLORAPREP W/TINT 26 (MISCELLANEOUS) ×1 IMPLANT
CLIP APPLIE ROT 10 11.4 M/L (STAPLE) ×1 IMPLANT
COVER MAYO STAND XLG (MISCELLANEOUS) ×1 IMPLANT
COVER SURGICAL LIGHT HANDLE (MISCELLANEOUS) ×1 IMPLANT
DERMABOND ADVANCED .7 DNX12 (GAUZE/BANDAGES/DRESSINGS) ×1 IMPLANT
DRAPE C-ARM 42X120 X-RAY (DRAPES) IMPLANT
ELECT REM PT RETURN 15FT ADLT (MISCELLANEOUS) ×1 IMPLANT
ENDOLOOP SUT PDS II  0 18 (SUTURE) ×1
ENDOLOOP SUT PDS II 0 18 (SUTURE) ×1 IMPLANT
GLOVE BIO SURGEON STRL SZ7.5 (GLOVE) ×1 IMPLANT
GLOVE BIOGEL PI IND STRL 8 (GLOVE) ×1 IMPLANT
GOWN STRL REUS W/ TWL XL LVL3 (GOWN DISPOSABLE) ×2 IMPLANT
GOWN STRL REUS W/TWL XL LVL3 (GOWN DISPOSABLE) ×2
GRASPER SUT TROCAR 14GX15 (MISCELLANEOUS) IMPLANT
HEMOSTAT SNOW SURGICEL 2X4 (HEMOSTASIS) IMPLANT
IRRIG SUCT STRYKERFLOW 2 WTIP (MISCELLANEOUS) ×1
IRRIGATION SUCT STRKRFLW 2 WTP (MISCELLANEOUS) ×1 IMPLANT
IV CATH 14GX2 1/4 (CATHETERS) ×1 IMPLANT
KIT BASIN OR (CUSTOM PROCEDURE TRAY) ×1 IMPLANT
KIT TURNOVER KIT A (KITS) IMPLANT
NDL INSUFFLATION 14GA 120MM (NEEDLE) ×1 IMPLANT
NEEDLE INSUFFLATION 14GA 120MM (NEEDLE) ×1 IMPLANT
PENCIL SMOKE EVACUATOR (MISCELLANEOUS) IMPLANT
POUCH RETRIEVAL ECOSAC 10 (ENDOMECHANICALS) ×1 IMPLANT
POUCH RETRIEVAL ECOSAC 10MM (ENDOMECHANICALS) ×1
SCISSORS LAP 5X35 DISP (ENDOMECHANICALS) ×1 IMPLANT
SET TUBE SMOKE EVAC HIGH FLOW (TUBING) ×1 IMPLANT
SLEEVE Z-THREAD 5X100MM (TROCAR) ×2 IMPLANT
SPIKE FLUID TRANSFER (MISCELLANEOUS) ×1 IMPLANT
STOPCOCK 4 WAY LG BORE MALE ST (IV SETS) IMPLANT
SUT MNCRL AB 4-0 PS2 18 (SUTURE) ×1 IMPLANT
TOWEL OR 17X26 10 PK STRL BLUE (TOWEL DISPOSABLE) ×1 IMPLANT
TOWEL OR NON WOVEN STRL DISP B (DISPOSABLE) IMPLANT
TRAY LAPAROSCOPIC (CUSTOM PROCEDURE TRAY) ×1 IMPLANT
TROCAR ADV FIXATION 12X100MM (TROCAR) ×1 IMPLANT
TROCAR Z-THREAD OPTICAL 5X100M (TROCAR) ×1 IMPLANT

## 2023-01-25 NOTE — Anesthesia Preprocedure Evaluation (Addendum)
Anesthesia Evaluation  Patient identified by MRN, date of birth, ID band Patient awake    Reviewed: Allergy & Precautions, H&P , NPO status , Patient's Chart, lab work & pertinent test results  History of Anesthesia Complications (+) PONV and history of anesthetic complications  Airway Mallampati: II  TM Distance: >3 FB Neck ROM: Full    Dental no notable dental hx.    Pulmonary neg pulmonary ROS   Pulmonary exam normal breath sounds clear to auscultation       Cardiovascular negative cardio ROS Normal cardiovascular exam Rhythm:Regular Rate:Normal     Neuro/Psych negative neurological ROS  negative psych ROS   GI/Hepatic negative GI ROS, Neg liver ROS,,,  Endo/Other  negative endocrine ROS    Renal/GU negative Renal ROS  negative genitourinary   Musculoskeletal negative musculoskeletal ROS (+)    Abdominal   Peds negative pediatric ROS (+)  Hematology negative hematology ROS (+)   Anesthesia Other Findings   Reproductive/Obstetrics negative OB ROS                             Anesthesia Physical Anesthesia Plan  ASA: 2  Anesthesia Plan: General   Post-op Pain Management: Minimal or no pain anticipated and Tylenol PO (pre-op)*   Induction: Intravenous  PONV Risk Score and Plan: 4 or greater and Ondansetron, Dexamethasone, Scopolamine patch - Pre-op, Midazolam and Treatment may vary due to age or medical condition  Airway Management Planned: Oral ETT  Additional Equipment:   Intra-op Plan:   Post-operative Plan: Extubation in OR  Informed Consent: I have reviewed the patients History and Physical, chart, labs and discussed the procedure including the risks, benefits and alternatives for the proposed anesthesia with the patient or authorized representative who has indicated his/her understanding and acceptance.     Dental advisory given  Plan Discussed with: CRNA and  Surgeon  Anesthesia Plan Comments:        Anesthesia Quick Evaluation

## 2023-01-25 NOTE — Discharge Instructions (Signed)
CHOLECYSTECTOMY POST OPERATIVE INSTRUCTIONS  Thinking Clearly  The anesthesia may cause you to feel different for 1 or 2 days. Do not drive, drink alcohol, or make any big decisions for at least 2 days.  Nutrition When you wake up, you will be able to drink small amounts of liquid. If you do not feel sick, you can slowly advance your diet to regular foods. Continue to drink lots of fluids, usually about 8 to 10 glasses per day. Eat a high-fiber diet so you don't strain during bowel movements. High-Fiber Foods Foods high in fiber include beans, bran cereals and whole-grain breads, peas, dried fruit (figs, apricots, and dates), raspberries, blackberries, strawberries, sweet corn, broccoli, baked potatoes with skin, plums, pears, apples, greens, and nuts. Activity Slowly increase your activity. Be sure to get up and walk every hour or so to prevent blood clots. No heavy lifting or strenuous activity for 4 weeks following surgery to prevent hernias at your incision sites It is normal to feel tired. You may need more sleep than usual.  Get your rest but make sure to get up and move around frequently to prevent blood clots and pneumonia.  Work and Return to [employer] can go back to work when you feel well enough. Discuss the timing with your surgeon. You can usually go back to school or work 1 week after an operation. If your work requires heavy lifting or strenuous activity you need to be placed on light duty for 4 weeks following surgery. You can return to gym class, sports or other physical activities 4 weeks after surgery.  Wound Care Always wash your hands before and after touching near your incision site. Do not soak in a bathtub until cleared at your follow up appointment. You may take a shower 24 hours after surgery. A small amount of drainage from the incision is normal. If the drainage is thick and yellow or the site is red, you may have an infection, so call your surgeon. If you  have a drain in one of your incisions, it will be taken out in office when the drainage stops. Steri-Strips will fall off in 7 to 10 days or they will be removed during your first office visit. If you have dermabond glue covering over the incision, allow the glue to flake off on its own. Avoid wearing tight or rough clothing. It may rub your incisions and make it harder for them to heal. Protect the new skin, especially from the sun. The sun can burn and cause darker scarring. Your scar will heal in about 4 to 6 weeks and will become softer and continue to fade over the next year.  The cosmetic appearance of the incisions will improve over the course of the first year after surgery. Sensation around your incision will return in a few weeks or months.  Bowel Movements After intestinal surgery, you may have loose watery stools for several days. If watery diarrhea lasts longer than 3 days, contact your surgeon. Pain medication (narcotics) can cause constipation. Increase the fiber in your diet with high-fiber foods if you are constipated. You can take an over the counter stool softener like Colace to avoid constipation.  Additional over the counter medications can also be used if Colace isn't sufficient (for example, Milk of Magnesia or Miralax).  Pain The amount of pain is different for each person. Some people need only 1 to 3 doses of pain control medication, while others need more. Take tylenol around the clock  for the first five days following surgery.  This will provide a baseline of pain control and help with inflammation.  Take the narcotic pain medication in addition if needed for severe pain.  Contact Your Surgeon at [phone], if you have: Pain in your right upper abdomen like a gallbladder attack. Pain that will not go away Pain that gets worse A fever of more than 101F (38.3C) Repeated vomiting Swelling, redness, bleeding, or bad-smelling drainage from your wound site Strong  abdominal pain No bowel movement or unable to pass gas for 3 days Watery diarrhea lasting longer than 3 days  Pain Control The goal of pain control is to minimize pain, keep you moving and help you heal. Your surgical team will work with you on your pain plan. Most often a combination of therapies and medications are used to control your pain. You may also be given medication (local anesthetic) at the surgical site. This may help control your pain for several days. Extreme pain puts extra stress on your body at a time when your body needs to focus on healing. Do not wait until your pain has reached a level "10" or is unbearable before telling your doctor or nurse. It is much easier to control pain before it becomes severe. Following a laparoscopic procedure, pain is sometimes felt in the shoulder. This is due to the gas inserted into your abdomen during the procedure. Moving and walking helps to decrease the gas and the right shoulder pain.  Use the guide below for ways to manage your post-operative pain. Learn more by going to facs.org/safepaincontrol.  How Intense Is My Pain Common Therapies to Feel Better       I hardly notice my pain, and it does not interfere with my activities.  I notice my pain and it distracts me, but I can still do activities (sitting up, walking, standing).  Non-Medication Therapies  Ice (in a bag, applied over clothing at the surgical site), elevation, rest, meditation, massage, distraction (music, TV, play) walking and mild exercise Splinting the abdomen with pillows +  Non-Opioid Medications Acetaminophen (Tylenol)      My pain is hard to ignore and is more noticeable even when I rest.  My pain interferes with my usual activities.  Non-Medication Therapies  +  Non-Opioid medications  Take on a regular schedule (around-the-clock) instead of as needed. (For example, Tylenol every 6 hours at 9:00 am, 3:00 pm, 9:00 pm, 3:00 am and Motrin every 6  hours at 12:00 am, 6:00 am, 12:00 pm, 6:00 pm)         I am focused on my pain, and I am not doing my daily activities.  I am groaning in pain, and I cannot sleep. I am unable to do anything.  My pain is as bad as it could be, and nothing else matters.  Non-Medication Therapies  +  Around-the-Clock Non-Opioid Medications  +  Short-acting opioids  Opioids should be used with other medications to manage severe pain. Opioids block pain and give a feeling of euphoria (feel high). Addiction, a serious side effect of opioids, is rare with short-term (a few days) use.  Examples of short-acting opioids include: Tramadol (Ultram), Hydrocodone (Norco, Vicodin), Hydromorphone (Dilaudid), Oxycodone (Oxycontin)     The above directions have been adapted from the [employer] Surgical Patient Education Program.  Please refer to the ACS website if needed: PureLie.ch.ashx.   Louanna Raw, [facility], [address],  [place], [place]  [address] ?  [address] [address], [place], [place]   [address] [phone] ? [phone] ? FAX [phone] Web site: [email or web address]  

## 2023-01-25 NOTE — Anesthesia Procedure Notes (Addendum)
Procedure Name: Intubation Date/Time: 01/25/2023 7:30 AM  Performed by: West Pugh, CRNAPre-anesthesia Checklist: Patient identified, Emergency Drugs available, Suction available, Patient being monitored and Timeout performed Patient Re-evaluated:Patient Re-evaluated prior to induction Oxygen Delivery Method: Circle system utilized Preoxygenation: Pre-oxygenation with 100% oxygen Induction Type: IV induction Ventilation: Mask ventilation without difficulty Laryngoscope Size: Mac and 4 Grade View: Grade II Tube type: Oral Tube size: 7.0 mm Number of attempts: 2 Airway Equipment and Method: Stylet Placement Confirmation: ETT inserted through vocal cords under direct vision, positive ETCO2, CO2 detector and breath sounds checked- equal and bilateral Secured at: 21 cm Tube secured with: Tape Dental Injury: Teeth and Oropharynx as per pre-operative assessment

## 2023-01-25 NOTE — H&P (Signed)
Admitting Physician: Nickola Major Allona Gondek  Service: General surgery  CC: Abdominal pain  Subjective   HPI: Monica [name] is an 47 y.o. female who is here for cholecystectomy.  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  She had ruptured appendicitis before this, and an appendectomy was performed at the time of gastric bypass.  She has known gallstones.  Recently she has had some nausea while eating.  Last Wednesday she had a severe abdominal pain attack in the right upper quadrant.  She originally thought the nausea problem was related to her gastric bypass anatomy, but now realizes it is probably all due to her gallbladder.  She almost went into the ER due to the pain.  But after a number of hours the pain passed and she called the office.  We discussed surgery and added her onto the schedule for today.  Past Medical History:  Diagnosis Date   AMA (advanced maternal age) multigravida 35+    Anxiety    Asthma    Depression    had PP depression   HSV (herpes simplex virus) anogenital infection    Hx of HELLP syndrome, currently pregnant    PONV (postoperative nausea and vomiting)    Vaginal Pap smear, abnormal     Past Surgical History:  Procedure Laterality Date   ADENOIDECTOMY     APPENDECTOMY     BIOPSY  05/31/2022   Procedure: BIOPSY;  Surgeon: Felicie Morn, MD;  Location: [facility];  Service: General;;   bone spur removal     CESAREAN SECTION  2013   CESAREAN SECTION N/A 10/20/2017   Procedure: REPEAT CESAREAN SECTION WITH SUPRA CLITORAL SKIN TAG REMOVAL;  Surgeon: Marylynn Pearson, MD;  Location: [facility];  Service: Obstetrics;  Laterality: N/A;  Repeat edc 11/05/17 nkda    CESAREAN SECTION WITH BILATERAL TUBAL LIGATION N/A 03/15/2020   Procedure: CESAREAN SECTION WITH BILATERAL TUBAL LIGATION;  Surgeon: Tyson Dense, MD;  Location: [facility];  Service: Obstetrics;  Laterality: N/A;   COLPOSCOPY     ESOPHAGOGASTRODUODENOSCOPY N/A  05/31/2022   Procedure: ESOPHAGOGASTRODUODENOSCOPY (EGD);  Surgeon: Felicie Morn, MD;  Location: [facility];  Service: General;  Laterality: N/A;   HIATAL HERNIA REPAIR N/A 08/07/2022   Procedure: HIATAL HERNIA REPAIR;  Surgeon: Felicie Morn, MD;  Location: [facility];  Service: General;  Laterality: N/A;   LAPAROSCOPIC APPENDECTOMY N/A 08/07/2022   Procedure: XI ROBOTI ASSISTED APPENDECTOMY;  Surgeon: Felicie Morn, MD;  Location: [facility];  Service: General;  Laterality: N/A;   LASIK     tonsil suregery     additional tissue removal   TONSILLECTOMY      Family History  Problem Relation Age of Onset   Hypertension Mother    Hashimoto's thyroiditis Mother    Hypertension Father    Skin cancer Paternal Grandfather    Stroke Paternal Grandfather    Allergic rhinitis Neg Hx    Angioedema Neg Hx    Asthma Neg Hx    Atopy Neg Hx    Eczema Neg Hx    Immunodeficiency Neg Hx    Urticaria Neg Hx     Social:  reports that she has never smoked. She has never used smokeless tobacco. She reports that she does not drink alcohol and does not use drugs.  Allergies: No Known Allergies  Medications: Current Outpatient Medications  Medication Instructions   calcium carbonate (TUMS - DOSED IN MG ELEMENTAL CALCIUM) 500 MG chewable tablet  1 tablet, Oral, 3 times daily   Cholecalciferol (VITAMIN D-3 PO) 1 capsule, Oral, Daily   fluticasone (FLONASE) 50 MCG/ACT nasal spray 1-2 sprays, Each Nare, Daily PRN   gabapentin (NEURONTIN) 200 mg, Oral, Every 12 hours   ketotifen (ZADITOR) 0.025 % ophthalmic solution 1 drop, Both Eyes, Daily PRN   levocetirizine (XYZAL) 5 mg, Oral, Every evening   Multiple Vitamins-Minerals (CELEBRATE MULTI-COMPLETE 45 PO) 2 tablets, Oral, Daily   ondansetron (ZOFRAN-ODT) 4 MG disintegrating tablet Dissolve 1 tablet (4 mg total) by mouth every 6 (six) hours as needed for nausea or vomiting.   oxyCODONE (OXY IR/ROXICODONE) 5 mg, Oral, Every 6 hours PRN    pantoprazole (PROTONIX) 40 mg, Oral, Daily   polyethylene glycol (MIRALAX) 17 g, Oral, Daily PRN   saccharomyces boulardii (FLORASTOR) 250 mg, Oral, 2 times daily, You can find a probiotic over the counter. Recommend taking while you are on antibiotics.   valACYclovir (VALTREX) 500 mg, Oral, Daily    ROS - all of the below systems have been reviewed with the patient and positives are indicated with bold text General: chills, fever or night sweats Eyes: blurry vision or double vision ENT: epistaxis or sore throat Allergy/Immunology: itchy/watery eyes or nasal congestion Hematologic/Lymphatic: bleeding problems, blood clots or swollen lymph nodes Endocrine: temperature intolerance or unexpected weight changes Breast: new or changing breast lumps or nipple discharge Resp: cough, shortness of breath, or wheezing CV: chest pain or dyspnea on exertion GI: as per HPI GU: dysuria, trouble voiding, or hematuria MSK: joint pain or joint stiffness Neuro: TIA or stroke symptoms Derm: pruritus and skin lesion changes Psych: anxiety and depression  Objective   PE Blood pressure 109/76, pulse 61, temperature 98.7 F (37.1 C), temperature source Oral, resp. rate 16, height '5\' 8"'$  (1.727 m), weight 90.3 kg, last menstrual period 01/03/2023, SpO2 97 %. Constitutional: NAD; conversant; no deformities Eyes: Moist conjunctiva; no lid lag; anicteric; PERRL Neck: Trachea midline; no thyromegaly Lungs: Normal respiratory effort; no tactile fremitus CV: RRR; no palpable thrills; no pitting edema GI: Abd Soft, nontender; no palpable hepatosplenomegaly MSK: Normal range of motion of extremities; no clubbing/cyanosis Psychiatric: Appropriate affect; alert and oriented x3 Lymphatic: No palpable cervical or axillary lymphadenopathy  Results for orders placed or performed during the hospital encounter of 01/25/23 (from the past 24 hour(s))  Pregnancy, urine POC     Status: None   Collection Time: 01/25/23   6:06 AM  Result Value Ref Range   Preg Test, Ur NEGATIVE NEGATIVE    CT 07/27/22  1. Slight decreased size of the loculated fluid collection in the right pelvis compared to 07/19/2022, now measuring 2 cm in maximum dimension. The appendix and right ovary are not definitively visualized. Differential of resolving appendicitis with periappendiceal abscess versus resolving tubo-ovarian abscess is unchanged. 2. Cholelithiasis without findings of cholecystitis. 3. A 1.3 cm left adrenal nodule, favored to represent a benign adenoma, is unchanged. Recommend 1 year follow-up adrenal washout CT. If the mass is stable for 1 year, no further follow-up imaging is indicated.   Assessment and Plan   Monica [name] is an 47 y.o. female with a history of gastric bypass with known gallstones and right upper quadrant abdominal pain consistent with cholecystitis.  I recommended laparoscopic cholecystectomy with intraoperative cholangiogram.  I explained I would inspect the retro-roux and jejunojejunostomy mesenteric defects.  We discussed the procedure itself as well as its risks, benefits and alternatives.  After a full discussion and all questions answered the  patient granted consent to proceed.  We will proceed as scheduled.    Felicie Morn, MD  [facility] Use [email or web address] to contact on call provider  New Patient Billing: [phone] - High MDM

## 2023-01-25 NOTE — Op Note (Signed)
Patient: Monica [name] (1976-06-16, [id number])  Date of Surgery: 01/25/2023   Preoperative Diagnosis: CHOLECYSTITIS   Postoperative Diagnosis: CHOLECYSTITIS   Surgical Procedure: LAPAROSCOPIC CHOLECYSTECTOMY WITH INTRAOPERATIVE CHOLANGIOGRAM: [id number] (CPT)   Operative Team Members:  Surgeon(s) and Role:    * Mckale Haffey, Nickola Major, MD - [place], [place]  Anesthesiologist: Myrtie Soman, MD CRNA: West Pugh, CRNA   Anesthesia: General   Fluids:  Total I/O In: 100 [IV 0000000 Out: -   Complications: None  Drains:  none   Specimen:  ID Type Source Tests Collected by Time Destination  1 : Gallbladder Tissue PATH Gallbladder SURGICAL PATHOLOGY Marionna Gonia, Nickola Major, MD 01/25/2023 0740      Disposition:  PACU - hemodynamically stable.  Plan of Care: Discharge to home after PACU    Indications for Procedure: Monica [name] is an 47 y.o. female with a history of gastric bypass with known gallstones and right upper quadrant abdominal pain consistent with cholecystitis.  I recommended laparoscopic cholecystectomy with intraoperative cholangiogram.  I explained I would inspect the retro-roux and jejunojejunostomy mesenteric defects.  We discussed the procedure itself as well as its risks, benefits and alternatives.  After a full discussion and all questions answered the patient granted consent to proceed.  We will proceed as scheduled.    Findings: Retro-roux and jejunojejunostomy mesenteric defect closures intact.  Normal cholangiogram.  Many small gallstones  Infection status: Patient: Private Patient Elective Case Case: Elective Infection Present At Time Of Surgery (PATOS):  Some spillage of bile during the case.   Description of Procedure:   On the date stated above, the patient was taken to the operating room suite and placed in supine positioning.  Sequential compression devices were placed on the lower extremities  to prevent blood clots.  General endotracheal anesthesia was induced. Preoperative antibiotics were given.  The patient's abdomen was prepped and draped in the usual sterile fashion.  A time-out was completed verifying the correct patient, procedure, positioning and equipment needed for the case.  We began by anesthetizing the skin with local anesthetic and then making a 5 mm incision just below the umbilicus.  We dissected through the subcutaneous tissues to the fascia.  The fascia was grasped and elevated using a Kocher clamp.  A Veress needle was inserted into the abdomen and the abdomen was insufflated to 15 mmHg.  A 5 mm trocar was inserted in this position under optical guidance and then the abdomen was inspected.  There was no trauma to the underlying viscera with initial trocar placement.  Any abnormal findings, other than inflammation in the right upper quadrant, are listed above in the findings section.  Three additional trocars were placed, one 12 mm trocar in the subxiphoid position, one 5 mm trocar in the midline epigastric area and one 45m trocar in the right upper quadrant subcostally.  These were placed under direct vision without any trauma to the underlying viscera.    The roux limb was run from the pouch to the jejunojejunostomy.  The retro-roux defect closure was intact with no hole for an internal hernia.  The jejunojejunostomy mesenteric defect closure was intact with no hole for an internal hernia.  The patient was then placed in head up, left side down positioning.  The gallbladder was identified and dissected free from its attachments to the omentum allowing the duodenum to fall away.  The infundibulum of the gallbladder was dissected free working laterally to medially.  The  cystic duct and cystic artery were dissected free from surrounding connective tissue.  The infundibulum of the gallbladder was dissected off the cystic plate.  A critical view of safety was obtained with the cystic  duct and cystic artery being cleared of connective tissues and clearly the only two structures entering into the gallbladder with the liver clearly visible behind.  One clip was applied high on the cystic duct.  A small ductotomy was created below this using the endoscopic shears.  A cholangiogram catheter was introduced through the abdominal wall and into the cystic duct through this ductotomy.  The catheter was clipped into position.  The catheter was flushed to ensure no leakage around the clip.  We then removed the laparoscopic instruments and positioned the C-Arm to perform a cholangiogram.  The catheter was flushed with contrast under fluoroscopic visualization and a cholangiogram was obtained.  The cholangiogram visualized the biliary tree from the ampulla up to the first two biliary radicals in the liver.  There were no filling defects identified.  The catheter clearly entered the cystic duct.  There was gradual tapering of the common bile duct down to the ampulla without evidence of stricture or other abnormalities.  Please see the EMR for saved representative images.  With our cholangiogram compete, we moved the c-arm away from the field and returned to laparoscopic surgery.    Clips were then applied to the cystic duct and cystic artery and then these structures were divided.  A PDS endoloop was placed to secure the cystic duct.  The gallbladder was dissected off the cystic plate, placed in an endocatch bag and removed from the 12 mm subxiphoid port site.  The clips were inspected and appeared effective.  The cystic plate was inspected and hemostasis was obtained using electrocautery.  A suction irrigator was used to clean the operative field.  Snow topical hemostatic agent was applied to the gallblader fossa. Attention was turned to closure.  The 12 mm subxiphoid port site was closed using a 0-vicryl suture on a fascial suture passer.  The abdomen was desufflated.  The skin was closed using 4-0  monocryl and dermabond.  All sponge and needle counts were correct at the conclusion of the case.    Louanna Raw, MD General, Bariatric, & Minimally Invasive Surgery [facility], [place]

## 2023-01-25 NOTE — Transfer of Care (Signed)
Immediate Anesthesia Transfer of Care Note  Patient: Monica [name]  Procedure(s) Performed: LAPAROSCOPIC CHOLECYSTECTOMY WITH INTRAOPERATIVE CHOLANGIOGRAM  Patient Location: PACU  Anesthesia Type:General  Level of Consciousness: drowsy and patient cooperative  Airway & Oxygen Therapy: Patient Spontanous Breathing and Patient connected to face mask oxygen  Post-op Assessment: Report given to RN and Post -op Vital signs reviewed and stable  Post vital signs: Reviewed and stable  Last Vitals:  Vitals Value Taken Time  BP 128/87 01/25/23 0851  Temp    Pulse 91 01/25/23 0855  Resp 15 01/25/23 0855  SpO2 100 % 01/25/23 0855  Vitals shown include unvalidated device data.  Last Pain:  Vitals:   01/25/23 0626  TempSrc: Oral         Complications: No notable events documented.

## 2023-01-25 NOTE — Anesthesia Postprocedure Evaluation (Signed)
Anesthesia Post Note  Patient: Monica [name]  Procedure(s) Performed: LAPAROSCOPIC CHOLECYSTECTOMY WITH INTRAOPERATIVE CHOLANGIOGRAM     Patient location during evaluation: PACU Anesthesia Type: General Level of consciousness: awake and alert Pain management: pain level controlled Vital Signs Assessment: post-procedure vital signs reviewed and stable Respiratory status: spontaneous breathing, nonlabored ventilation, respiratory function stable and patient connected to nasal cannula oxygen Cardiovascular status: blood pressure returned to baseline and stable Postop Assessment: no apparent nausea or vomiting Anesthetic complications: no  No notable events documented.  Last Vitals:  Vitals:   01/25/23 0915 01/25/23 0930  BP: 129/86 127/76  Pulse: 79 64  Resp: 15 13  Temp:    SpO2: 96% 94%    Last Pain:  Vitals:   01/25/23 0930  TempSrc:   PainSc: 5                  Jerret Mcbane S

## 2023-01-26 ENCOUNTER — Encounter (HOSPITAL_COMMUNITY): Payer: Self-pay | Admitting: Surgery

## 2023-01-26 LAB — SURGICAL PATHOLOGY

## 2023-01-27 ENCOUNTER — Emergency Department (HOSPITAL_COMMUNITY): Payer: Commercial Managed Care - PPO

## 2023-01-27 ENCOUNTER — Other Ambulatory Visit: Payer: Self-pay

## 2023-01-27 ENCOUNTER — Inpatient Hospital Stay (HOSPITAL_COMMUNITY)
Admission: EM | Admit: 2023-01-27 | Discharge: 2023-02-07 | DRG: 356 | Disposition: A | Discharge status: Home / Self Care | Payer: Commercial Managed Care - PPO | Attending: Surgery | Admitting: Surgery

## 2023-01-27 DIAGNOSIS — E43 Unspecified severe protein-calorie malnutrition: Secondary | ICD-10-CM | POA: Insufficient documentation

## 2023-01-27 DIAGNOSIS — D649 Anemia, unspecified: Secondary | ICD-10-CM | POA: Diagnosis not present

## 2023-01-27 DIAGNOSIS — Z6829 Body mass index (BMI) 29.0-29.9, adult: Secondary | ICD-10-CM | POA: Diagnosis not present

## 2023-01-27 DIAGNOSIS — R5383 Other fatigue: Secondary | ICD-10-CM | POA: Diagnosis present

## 2023-01-27 DIAGNOSIS — Z808 Family history of malignant neoplasm of other organs or systems: Secondary | ICD-10-CM | POA: Diagnosis not present

## 2023-01-27 DIAGNOSIS — Y836 Removal of other organ (partial) (total) as the cause of abnormal reaction of the patient, or of later complication, without mention of misadventure at the time of the procedure: Secondary | ICD-10-CM | POA: Diagnosis present

## 2023-01-27 DIAGNOSIS — K839 Disease of biliary tract, unspecified: Secondary | ICD-10-CM | POA: Diagnosis present

## 2023-01-27 DIAGNOSIS — D75839 Thrombocytosis, unspecified: Secondary | ICD-10-CM | POA: Diagnosis not present

## 2023-01-27 DIAGNOSIS — R1011 Right upper quadrant pain: Secondary | ICD-10-CM

## 2023-01-27 DIAGNOSIS — Z823 Family history of stroke: Secondary | ICD-10-CM

## 2023-01-27 DIAGNOSIS — G8918 Other acute postprocedural pain: Principal | ICD-10-CM

## 2023-01-27 DIAGNOSIS — Z8249 Family history of ischemic heart disease and other diseases of the circulatory system: Secondary | ICD-10-CM | POA: Diagnosis not present

## 2023-01-27 DIAGNOSIS — Z79899 Other long term (current) drug therapy: Secondary | ICD-10-CM

## 2023-01-27 DIAGNOSIS — Z9884 Bariatric surgery status: Secondary | ICD-10-CM

## 2023-01-27 DIAGNOSIS — K8010 Calculus of gallbladder with chronic cholecystitis without obstruction: Secondary | ICD-10-CM | POA: Diagnosis present

## 2023-01-27 DIAGNOSIS — K653 Choleperitonitis: Secondary | ICD-10-CM | POA: Diagnosis present

## 2023-01-27 DIAGNOSIS — K9189 Other postprocedural complications and disorders of digestive system: Secondary | ICD-10-CM | POA: Diagnosis present

## 2023-01-27 DIAGNOSIS — K5900 Constipation, unspecified: Secondary | ICD-10-CM | POA: Diagnosis present

## 2023-01-27 LAB — CBC WITH DIFFERENTIAL/PLATELET
Abs Immature Granulocytes: 0.05 10*3/uL (ref 0.00–0.07)
Basophils Absolute: 0 10*3/uL (ref 0.0–0.1)
Basophils Relative: 0 %
Eosinophils Absolute: 0 10*3/uL (ref 0.0–0.5)
Eosinophils Relative: 0 %
HCT: 45.9 % (ref 36.0–46.0)
Hemoglobin: 14.8 g/dL (ref 12.0–15.0)
Immature Granulocytes: 0 %
Lymphocytes Relative: 9 %
Lymphs Abs: 1.5 10*3/uL (ref 0.7–4.0)
MCH: 27.7 pg (ref 26.0–34.0)
MCHC: 32.2 g/dL (ref 30.0–36.0)
MCV: 85.8 fL (ref 80.0–100.0)
Monocytes Absolute: 0.9 10*3/uL (ref 0.1–1.0)
Monocytes Relative: 6 %
Neutro Abs: 13.5 10*3/uL — ABNORMAL HIGH (ref 1.7–7.7)
Neutrophils Relative %: 85 %
Platelets: 438 10*3/uL — ABNORMAL HIGH (ref 150–400)
RBC: 5.35 MIL/uL — ABNORMAL HIGH (ref 3.87–5.11)
RDW: 14.9 % (ref 11.5–15.5)
WBC: 16 10*3/uL — ABNORMAL HIGH (ref 4.0–10.5)
nRBC: 0 % (ref 0.0–0.2)

## 2023-01-27 LAB — CBC
HCT: 41.6 % (ref 36.0–46.0)
Hemoglobin: 13.3 g/dL (ref 12.0–15.0)
MCH: 27.5 pg (ref 26.0–34.0)
MCHC: 32 g/dL (ref 30.0–36.0)
MCV: 86 fL (ref 80.0–100.0)
Platelets: 264 10*3/uL (ref 150–400)
RBC: 4.84 MIL/uL (ref 3.87–5.11)
RDW: 14.8 % (ref 11.5–15.5)
WBC: 11 10*3/uL — ABNORMAL HIGH (ref 4.0–10.5)
nRBC: 0 % (ref 0.0–0.2)

## 2023-01-27 LAB — COMPREHENSIVE METABOLIC PANEL
ALT: 45 U/L — ABNORMAL HIGH (ref 0–44)
AST: 37 U/L (ref 15–41)
Albumin: 4.4 g/dL (ref 3.5–5.0)
Alkaline Phosphatase: 88 U/L (ref 38–126)
Anion gap: 10 (ref 5–15)
BUN: 9 mg/dL (ref 6–20)
CO2: 27 mmol/L (ref 22–32)
Calcium: 9 mg/dL (ref 8.9–10.3)
Chloride: 99 mmol/L (ref 98–111)
Creatinine, Ser: 0.66 mg/dL (ref 0.44–1.00)
GFR, Estimated: >60 mL/min (ref >60)
Glucose, Bld: 148 mg/dL — ABNORMAL HIGH (ref 70–99)
Potassium: 3.7 mmol/L (ref 3.5–5.1)
Sodium: 136 mmol/L (ref 135–145)
Total Bilirubin: 1.3 mg/dL — ABNORMAL HIGH (ref 0.3–1.2)
Total Protein: 7.9 g/dL (ref 6.5–8.1)

## 2023-01-27 LAB — LIPASE, BLOOD: Lipase: 34 U/L (ref 11–51)

## 2023-01-27 LAB — LACTIC ACID, PLASMA
Lactic Acid, Venous: 2.2 mmol/L (ref 0.5–1.9)
Lactic Acid, Venous: 2 mmol/L (ref 0.5–1.9)

## 2023-01-27 LAB — I-STAT BETA HCG BLOOD, ED (MC, WL, AP ONLY): I-stat hCG, quantitative: <5 m[IU]/mL (ref <5)

## 2023-01-27 MED ORDER — MAGNESIUM CITRATE PO SOLN
1.0000 | Freq: Once | ORAL | Status: AC
  Administered 2023-01-27: 1 via ORAL
  Filled 2023-01-27: qty 296

## 2023-01-27 MED ORDER — PIPERACILLIN-TAZOBACTAM 3.375 G IVPB
3.3750 g | Freq: Three times a day (TID) | INTRAVENOUS | Status: DC
  Administered 2023-01-27 – 2023-02-06 (×28): 3.375 g via INTRAVENOUS
  Filled 2023-01-27 (×27): qty 50

## 2023-01-27 MED ORDER — ONDANSETRON HCL 4 MG/2ML IJ SOLN
4.0000 mg | Freq: Once | INTRAMUSCULAR | Status: AC
  Administered 2023-01-27: 4 mg via INTRAVENOUS
  Filled 2023-01-27: qty 2

## 2023-01-27 MED ORDER — LACTATED RINGERS IV SOLN: INTRAVENOUS | Status: AC

## 2023-01-27 MED ORDER — PIPERACILLIN-TAZOBACTAM 3.375 G IVPB
3.3750 g | Freq: Once | INTRAVENOUS | Status: AC
  Administered 2023-01-27: 3.375 g via INTRAVENOUS
  Filled 2023-01-27: qty 50

## 2023-01-27 MED ORDER — MORPHINE SULFATE (PF) 4 MG/ML IV SOLN: 4.0000 mg | Freq: Once | INTRAVENOUS | Status: DC

## 2023-01-27 MED ORDER — DEXTROSE-NACL 5-0.9 % IV SOLN: INTRAVENOUS | Status: AC

## 2023-01-27 MED ORDER — ENOXAPARIN SODIUM 40 MG/0.4ML IJ SOSY
40.0000 mg | PREFILLED_SYRINGE | INTRAMUSCULAR | Status: DC
  Administered 2023-01-27: 40 mg via SUBCUTANEOUS
  Filled 2023-01-27: qty 0.4

## 2023-01-27 MED ORDER — ONDANSETRON HCL 4 MG/2ML IJ SOLN
4.0000 mg | Freq: Four times a day (QID) | INTRAMUSCULAR | Status: DC | PRN
  Administered 2023-01-27 – 2023-02-06 (×10): 4 mg via INTRAVENOUS
  Filled 2023-01-27 (×10): qty 2

## 2023-01-27 MED ORDER — HYDROMORPHONE HCL 1 MG/ML IJ SOLN
1.0000 mg | Freq: Once | INTRAMUSCULAR | Status: AC
  Administered 2023-01-27: 1 mg via INTRAVENOUS
  Filled 2023-01-27: qty 1

## 2023-01-27 MED ORDER — HYDROMORPHONE HCL 1 MG/ML IJ SOLN
0.5000 mg | INTRAMUSCULAR | Status: DC | PRN
  Administered 2023-01-27 – 2023-02-03 (×38): 1 mg via INTRAVENOUS
  Filled 2023-01-27 (×38): qty 1

## 2023-01-27 MED ORDER — IOHEXOL 300 MG/ML  SOLN: 100.0000 mL | Freq: Once | INTRAMUSCULAR | Status: DC | PRN

## 2023-01-27 MED ORDER — HYDROCODONE-ACETAMINOPHEN 5-325 MG PO TABS
1.0000 | ORAL_TABLET | ORAL | Status: DC | PRN
  Administered 2023-01-27 – 2023-02-02 (×20): 2 via ORAL
  Administered 2023-02-02: 1 via ORAL
  Administered 2023-02-03 – 2023-02-07 (×5): 2 via ORAL
  Filled 2023-01-27 (×27): qty 2

## 2023-01-27 MED ORDER — ONDANSETRON HCL 4 MG PO TABS
4.0000 mg | ORAL_TABLET | Freq: Four times a day (QID) | ORAL | Status: DC | PRN
  Administered 2023-01-31 (×2): 4 mg via ORAL
  Filled 2023-01-27 (×2): qty 1

## 2023-01-27 MED ORDER — IOHEXOL 300 MG/ML  SOLN
100.0000 mL | Freq: Once | INTRAMUSCULAR | Status: AC | PRN
  Administered 2023-01-27: 100 mL via INTRAVENOUS

## 2023-01-27 NOTE — H&P (Signed)
Subjective/Chief Complaint: Patient is 2 days postop from a lap chole.  She states that she began having pain the day after surgery.  States it is fairly severe.  States that it continued in intensity.  She was directed to the ER for further evaluation.  On evaluation in the ER she underwent CT scan and ultrasound.  This was negative for large amount of intra-abdominal ascites with possible bile leak.  Patient states that she had no necessary fevers or chills at home.  She states that she had minimal appetite.  Patient with a leukocytosis on arrival.     Objective: Vital signs in last 24 hours: Temp:  [97.8 F (36.6 C)-98.3 F (36.8 C)] 98.3 F (36.8 C) (03/09 1744) Pulse Rate:  [79-123] 107 (03/09 1744) Resp:  [10-32] 16 (03/09 1744) BP: (123-147)/(78-109) 135/93 (03/09 1744) SpO2:  [92 %-99 %] 96 % (03/09 1744) Last BM Date :  (No BM as per patient.)  Intake/Output from previous day: No intake/output data recorded. Intake/Output this shift: No intake/output data recorded.  PE:  Constitutional: No acute distress, conversant, appears states age. Eyes: Anicteric sclerae, moist conjunctiva, no lid lag Lungs: Clear to auscultation bilaterally, normal respiratory effort CV: regular rate and rhythm, no murmurs, no peripheral edema, pedal pulses 2+ GI: Soft, no masses or hepatosplenomegaly, generalized-tender to palpation Skin: No rashes, palpation reveals normal turgor Psychiatric: appropriate judgment and insight, oriented to person, place, and time   Lab Results:  Recent Labs    01/27/23 1157 01/27/23 1800  WBC 16.0* 11.0*  HGB 14.8 13.3  HCT 45.9 41.6  PLT 438* 264   BMET Recent Labs    01/27/23 1157  NA 136  K 3.7  CL 99  CO2 27  GLUCOSE 148*  BUN 9  CREATININE 0.66  CALCIUM 9.0   PT/INR No results for input(s): "LABPROT", "INR" in the last 72 hours. ABG No results for input(s): "PHART", "HCO3" in the last 72 hours.  Invalid input(s): "PCO2",  "PO2"  Studies/Results: US Abdomen Limited  Result Date: 01/27/2023 CLINICAL DATA:  Right upper quadrant abdominal pain. Prior cholecystectomy EXAM: ULTRASOUND ABDOMEN LIMITED RIGHT UPPER QUADRANT COMPARISON:  CT abdomen and pelvis earlier same day FINDINGS: Gallbladder: Surgically absent Common bile duct: Diameter: 7 mm Liver: No focal lesion identified. Within normal limits in parenchymal echogenicity. Portal vein is patent on color Doppler imaging with normal direction of blood flow towards the liver. Other: Free fluid is demonstrated within the gallbladder fossa as well as in the perihepatic location. IMPRESSION: 1. Status post cholecystectomy. 2. Free fluid within the right upper quadrant and gallbladder fossa. While findings may be postoperative in etiology, the possibility of bile leak is not excluded. Consider further evaluation with HIDA scan as clinically warranted. Electronically Signed   By: Lovey Newcomer M.D.   On: 01/27/2023 15:25   CT ABDOMEN PELVIS W CONTRAST  Result Date: 01/27/2023 CLINICAL DATA:  Postop cholecystectomy 2 days ago with worsening abdominal pain. EXAM: CT ABDOMEN AND PELVIS WITH CONTRAST TECHNIQUE: Multidetector CT imaging of the abdomen and pelvis was performed using the standard protocol following bolus administration of intravenous contrast. RADIATION DOSE REDUCTION: This exam was performed according to the departmental dose-optimization program which includes automated exposure control, adjustment of the mA and/or kV according to patient size and/or use of iterative reconstruction technique. CONTRAST:  178m OMNIPAQUE IOHEXOL 300 MG/ML  SOLN COMPARISON:  07/27/2022 FINDINGS: Lower chest: Basilar atelectasis noted both lower lobes. Hepatobiliary: No suspicious focal abnormality within the liver  parenchyma. Free fluid is seen anterior and lateral to the liver with some free air under the non dependent right-sided hemidiaphragm. Fluid collection identified in the gallbladder  fossa with fluid tracking in Morison's pouch. Gallbladder surgically absent. Common bile duct measures up to 9 mm diameter in the head of the pancreas. Pancreas: No focal mass lesion. No dilatation of the main duct. No intraparenchymal cyst. No peripancreatic edema. Spleen: No splenomegaly. No focal mass lesion. Adrenals/Urinary Tract: 12 mm left adrenal nodule is stable since prior study. Right adrenal gland unremarkable. Kidneys unremarkable. No evidence for hydroureter. The urinary bladder appears normal for the degree of distention. Stomach/Bowel: Status post gastric bypass. Excluded portion of the stomach is filled with gas and fluid. No small bowel dilatation to suggest obstruction. The terminal ileum is normal. The appendix is not well visualized, but there is no edema or inflammation in the region of the cecum. Moderate to large stool volume noted in the right and proximal transverse colon. Distal transverse and left colon is decompressed. Sigmoid colon and rectum unremarkable. Vascular/Lymphatic: No abdominal aortic aneurysm. No abdominal lymphadenopathy No pelvic sidewall lymphadenopathy. Reproductive: Unremarkable. Other: Small volume free fluid seen in the right paracolic gutter with small to moderate volume free fluid in the pelvis. Small volume intraperitoneal free gas not unexpected 2 days upper cholecystectomy Musculoskeletal: Trace gas is identified in the subcutaneous fat of the anterior abdominal wall compatible with recent surgery. No worrisome lytic or sclerotic osseous abnormality. IMPRESSION: 1. Status post cholecystectomy. Fluid collection identified along the lateral and anterior surface of the liver as well as in the gallbladder fossa tracking into Morison's pouch. Small volume free fluid in the right paracolic gutter with small to moderate volume free fluid in the pelvis. Bile leak not excluded. 2. Small volume intraperitoneal free gas not unexpected 2 days upper cholecystectomy. 3.  Moderate to large stool volume in the right and proximal transverse colon. Distal transverse and left colon is decompressed. Imaging features could be compatible with decreased right colonic transit. 4. Stable 12 mm left adrenal nodule. This cannot be definitively characterized but is likely benign given interval stability. 12 month follow-up was recommended on the 07/27/2022 exam which corresponds to proximally 6 months from today's study. 5. No evidence for small bowel obstruction. Electronically Signed   By: Misty Stanley M.D.   On: 01/27/2023 14:02    Anti-infectives: Anti-infectives (From admission, onward)    Start     Dose/Rate Route Frequency Ordered Stop   01/27/23 2200  piperacillin-tazobactam (ZOSYN) IVPB 3.375 g        3.375 g 12.5 mL/hr over 240 Minutes Intravenous Every 8 hours 01/27/23 1822     01/27/23 1330  piperacillin-tazobactam (ZOSYN) IVPB 3.375 g        3.375 g 12.5 mL/hr over 240 Minutes Intravenous  Once 01/27/23 1321 01/27/23 1715       Assessment/Plan: 47 year old female status post lap chole, history of gastric bypass, likely bile leak  1.  We will await HIDA scan to confirm bile leak however patient appears to have this based on ultrasound CT scan and physical exam. 2.  Continue with Zosyn 3.  Will discuss with Dr. Therisa Doyne in regards to possible ERCP after confirmation of HIDA scan 4.  Continue NPO for now.  LOS: 0 days    Ralene Ok 01/27/2023

## 2023-01-27 NOTE — ED Provider Notes (Signed)
[facility] [place] EMERGENCY DEPARTMENT AT [facility] Provider Note   CSN: [id number] Arrival date & time: 01/27/23  1103     History  Chief Complaint  Patient presents with   Post-op Problem    Monica [name] is a 47 y.o. female.  47 y.o female with a PMH Postoperative Pain, Anxiety presents to the ED with a chief complaint of abdominal pain x 2 days. Patient has a cholecystectomy by Dr. Thermon Leyland, she reports going home that night, felt the pain was excruciating.  She reports taking her pain prescription, hydrocodone every 3 hours without any improvement in her symptoms.  She has also had a couple episodes of dry heaving, which she is unable to vomit.  She did try to take some MiraLAX yesterday with some breath as she was constipated going into surgery and has not had a bowel movement since prior to her surgery. No alleviating or exacerbating factors. Patient called her surgeon prior to arrival who advice her to be seen in the ED. She denies any fever, no diarrhea, no shortness of breath or other complaints.   The history is provided by the patient.       Home Medications Prior to Admission medications   Medication Sig Start Date End Date Taking? Authorizing Provider  calcium carbonate (TUMS - DOSED IN MG ELEMENTAL CALCIUM) 500 MG chewable tablet Chew 1 tablet by mouth 3 (three) times daily.    [provider]  Cholecalciferol (VITAMIN D-3 PO) Take 1 capsule by mouth daily.    [provider]  fluticasone (FLONASE) 50 MCG/ACT nasal spray Place 1-2 sprays into both nostrils daily as needed for allergies.    [provider]  gabapentin (NEURONTIN) 100 MG capsule Take 2 capsules (200 mg total) by mouth every 12 (twelve) hours. Patient taking differently: Take 200 mg by mouth every 12 (twelve) hours as needed (pain). 08/09/22   Stechschulte, Nickola Major, MD  ketotifen (ZADITOR) 0.025 % ophthalmic solution Place 1 drop into both eyes daily as needed (itchy  eyes).    [provider]  levocetirizine (XYZAL) 5 MG tablet Take 5 mg by mouth every evening.    [provider]  Multiple Vitamins-Minerals (CELEBRATE MULTI-COMPLETE 45 PO) Take 2 tablets by mouth daily.    [provider]  ondansetron (ZOFRAN-ODT) 4 MG disintegrating tablet Dissolve 1 tablet (4 mg total) by mouth every 6 (six) hours as needed for nausea or vomiting. 08/09/22   Stechschulte, Nickola Major, MD  oxyCODONE-acetaminophen (PERCOCET) 5-325 MG tablet Take 1 tablet by mouth every 4 (four) hours as needed for severe pain. 01/25/23 01/25/24  Stechschulte, Nickola Major, MD  polyethylene glycol (MIRALAX) 17 g packet Take 17 g by mouth daily as needed for mild constipation. 07/10/22   Meuth, Blaine Hamper, PA-C  valACYclovir (VALTREX) 500 MG tablet Take 500 mg by mouth daily. 12/24/21   [provider]      Allergies    Patient has no known allergies.    Review of Systems   Review of Systems  Constitutional:  Negative for chills and fever.  HENT:  Negative for sore throat.   Respiratory:  Negative for shortness of breath.   Gastrointestinal:  Positive for abdominal pain, constipation and nausea. Negative for diarrhea and vomiting.  Genitourinary:  Negative for flank pain.  All other systems reviewed and are negative.   Physical Exam Updated Vital Signs BP 127/80   Pulse 97   Temp 98 F (36.7 C) (Oral)   Resp  20   LMP 01/03/2023 Comment: negative pregnancy test  SpO2 92%  Physical Exam Vitals and nursing note reviewed.  Constitutional:      Appearance: Normal appearance.     Comments: Appears very uncomfortable.   HENT:     Head: Normocephalic and atraumatic.     Mouth/Throat:     Mouth: Mucous membranes are moist.  Eyes:     Pupils: Pupils are equal, round, and reactive to light.  Cardiovascular:     Rate and Rhythm: Tachycardia present.  Pulmonary:     Effort: Pulmonary effort is normal.     Breath sounds: No wheezing.  Abdominal:     General: Abdomen  is flat.     Palpations: Abdomen is soft.     Tenderness: There is abdominal tenderness.     Comments: Incisions are present with mild erythema but well apparent.   Musculoskeletal:     Cervical back: Normal range of motion and neck supple.  Skin:    General: Skin is warm and dry.  Neurological:     Mental Status: She is alert and oriented to person, place, and time.     ED Results / Procedures / Treatments   Labs (all labs ordered are listed, but only abnormal results are displayed) Labs Reviewed  CBC WITH DIFFERENTIAL/PLATELET - Abnormal; Notable for the following components:      Result Value   WBC 16.0 (*)    RBC 5.35 (*)    Platelets 438 (*)    Neutro Abs 13.5 (*)    All other components within normal limits  COMPREHENSIVE METABOLIC PANEL - Abnormal; Notable for the following components:   Glucose, Bld 148 (*)    ALT 45 (*)    Total Bilirubin 1.3 (*)    All other components within normal limits  LACTIC ACID, PLASMA - Abnormal; Notable for the following components:   Lactic Acid, Venous 2.2 (*)    All other components within normal limits  LACTIC ACID, PLASMA - Abnormal; Notable for the following components:   Lactic Acid, Venous 2.0 (*)    All other components within normal limits  CULTURE, BLOOD (ROUTINE X 2) W REFLEX TO ID PANEL  LIPASE, BLOOD  I-STAT BETA HCG BLOOD, ED ([facility], [facility], [facility] ONLY)    EKG EKG Interpretation  Date/Time:  Saturday January 27 2023 11:43:31 EST Ventricular Rate:  124 PR Interval:  163 QRS Duration: 87 QT Interval:  301 QTC Calculation: 433 R Axis:   46 Text Interpretation: Sinus tachycardia Confirmed by Octaviano Glow [phone]) on 01/27/2023 12:01:38 PM  Radiology US Abdomen Limited  Result Date: 01/27/2023 CLINICAL DATA:  Right upper quadrant abdominal pain. Prior cholecystectomy EXAM: ULTRASOUND ABDOMEN LIMITED RIGHT UPPER QUADRANT COMPARISON:  CT abdomen and pelvis earlier same day FINDINGS: Gallbladder: Surgically absent Common bile  duct: Diameter: 7 mm Liver: No focal lesion identified. Within normal limits in parenchymal echogenicity. Portal vein is patent on color Doppler imaging with normal direction of blood flow towards the liver. Other: Free fluid is demonstrated within the gallbladder fossa as well as in the perihepatic location. IMPRESSION: 1. Status post cholecystectomy. 2. Free fluid within the right upper quadrant and gallbladder fossa. While findings may be postoperative in etiology, the possibility of bile leak is not excluded. Consider further evaluation with HIDA scan as clinically warranted. Electronically Signed   By: Lovey Newcomer M.D.   On: 01/27/2023 15:25   CT ABDOMEN PELVIS W CONTRAST  Result Date: 01/27/2023 CLINICAL DATA:  Postop cholecystectomy  2 days ago with worsening abdominal pain. EXAM: CT ABDOMEN AND PELVIS WITH CONTRAST TECHNIQUE: Multidetector CT imaging of the abdomen and pelvis was performed using the standard protocol following bolus administration of intravenous contrast. RADIATION DOSE REDUCTION: This exam was performed according to the departmental dose-optimization program which includes automated exposure control, adjustment of the mA and/or kV according to patient size and/or use of iterative reconstruction technique. CONTRAST:  171m OMNIPAQUE IOHEXOL 300 MG/ML  SOLN COMPARISON:  07/27/2022 FINDINGS: Lower chest: Basilar atelectasis noted both lower lobes. Hepatobiliary: No suspicious focal abnormality within the liver parenchyma. Free fluid is seen anterior and lateral to the liver with some free air under the non dependent right-sided hemidiaphragm. Fluid collection identified in the gallbladder fossa with fluid tracking in Morison's pouch. Gallbladder surgically absent. Common bile duct measures up to 9 mm diameter in the head of the pancreas. Pancreas: No focal mass lesion. No dilatation of the main duct. No intraparenchymal cyst. No peripancreatic edema. Spleen: No splenomegaly. No focal mass  lesion. Adrenals/Urinary Tract: 12 mm left adrenal nodule is stable since prior study. Right adrenal gland unremarkable. Kidneys unremarkable. No evidence for hydroureter. The urinary bladder appears normal for the degree of distention. Stomach/Bowel: Status post gastric bypass. Excluded portion of the stomach is filled with gas and fluid. No small bowel dilatation to suggest obstruction. The terminal ileum is normal. The appendix is not well visualized, but there is no edema or inflammation in the region of the cecum. Moderate to large stool volume noted in the right and proximal transverse colon. Distal transverse and left colon is decompressed. Sigmoid colon and rectum unremarkable. Vascular/Lymphatic: No abdominal aortic aneurysm. No abdominal lymphadenopathy No pelvic sidewall lymphadenopathy. Reproductive: Unremarkable. Other: Small volume free fluid seen in the right paracolic gutter with small to moderate volume free fluid in the pelvis. Small volume intraperitoneal free gas not unexpected 2 days upper cholecystectomy Musculoskeletal: Trace gas is identified in the subcutaneous fat of the anterior abdominal wall compatible with recent surgery. No worrisome lytic or sclerotic osseous abnormality. IMPRESSION: 1. Status post cholecystectomy. Fluid collection identified along the lateral and anterior surface of the liver as well as in the gallbladder fossa tracking into Morison's pouch. Small volume free fluid in the right paracolic gutter with small to moderate volume free fluid in the pelvis. Bile leak not excluded. 2. Small volume intraperitoneal free gas not unexpected 2 days upper cholecystectomy. 3. Moderate to large stool volume in the right and proximal transverse colon. Distal transverse and left colon is decompressed. Imaging features could be compatible with decreased right colonic transit. 4. Stable 12 mm left adrenal nodule. This cannot be definitively characterized but is likely benign given  interval stability. 12 month follow-up was recommended on the 07/27/2022 exam which corresponds to proximally 6 months from today's study. 5. No evidence for small bowel obstruction. Electronically Signed   By: EMisty StanleyM.D.   On: 01/27/2023 14:02    Procedures Procedures    Medications Ordered in ED Medications  lactated ringers infusion (0 mLs Intravenous Stopped 01/27/23 1346)  piperacillin-tazobactam (ZOSYN) IVPB 3.375 g (3.375 g Intravenous New Bag/Given 01/27/23 1346)  HYDROmorphone (DILAUDID) injection 1 mg (1 mg Intravenous Given 01/27/23 1204)  ondansetron (ZOFRAN) injection 4 mg (4 mg Intravenous Given 01/27/23 1204)  iohexol (OMNIPAQUE) 300 MG/ML solution 100 mL (100 mLs Intravenous Contrast Given 01/27/23 1323)  HYDROmorphone (DILAUDID) injection 1 mg (1 mg Intravenous Given 01/27/23 1359)  HYDROmorphone (DILAUDID) injection 1 mg (1 mg Intravenous Given 01/27/23  44)    ED Course/ Medical Decision Making/ A&P Clinical Course as of 01/27/23 1602  Sat Jan 27, 2023  1159 This is a 47 year old female underwent laparoscopic cholecystectomy 2 days ago, presented ED with persistent or worsening abdominal pain, bloating, nausea, belching.  She has not had a bowel movement or passed gas in 2 days.  She reports a prior surgical history of cholecystectomy, gastric bypass, and hernia repair, all in September of last year.  She has been taking her prescribed oxycodone every 3 hours but is not relieving her pain.  Her pain is worse with movement.  On exam the patient does have signs of mild peritonitis.  She has rebound tenderness and diffuse guarding on abdominal exam.  She does not have rigidity of the abdomen.  She is afebrile and tachycardic on arrival.  We are pending labs, likely patient will need a repeat CT scan for bowel obstruction evaluation. [MT]  1406 CT scan reviewed.  There is possibility of a bile leak.  Versus postoperative changes.  She also has evidence of slow transit in the colon  which may be related to the pain medication she is on.  No bowel obstruction.  General surgery has been consulted.  The patient has been started on a dose of broad-spectrum antibiotics to cover for intra-abdominal pathogens with Zosyn.  We will await general surgery consult [MT]    Clinical Course User Index [MT] Trifan, Carola Rhine, MD                             Medical Decision Making Amount and/or Complexity of Data Reviewed Labs: ordered. Radiology: ordered.  Risk Prescription drug management.   This patient presents to the ED for concern of abdominal pain, this involves a number of treatment options, and is a complaint that carries with it a high risk of complications and morbidity.  The differential diagnosis includes postoperative infection versus complication.    Co morbidities: Discussed in HPI   Brief History:    EMR reviewed including pt PMHx, past surgical history and past visits to ER.   See HPI for more details   Lab Tests:  I ordered and independently interpreted labs.  The pertinent results include:    Labs notable for CBC with a leukocytosis of 16.0.  Hemoglobin is stable.  CMP with no electrolyte derangement, creatinine level is unremarkable.  LFTs.  Total bili is slightly elevated at 1.3.  Lactic acid was obtained which was 2.2.  Patient was started per sepsis protocol on Zosyn intra-abdominal.    Imaging Studies:  CT abdomen: 1. Status post cholecystectomy. Fluid collection identified along  the lateral and anterior surface of the liver as well as in the  gallbladder fossa tracking into Morison's pouch. Small volume free  fluid in the right paracolic gutter with small to moderate volume  free fluid in the pelvis. Bile leak not excluded.  2. Small volume intraperitoneal free gas not unexpected 2 days upper  cholecystectomy.  3. Moderate to large stool volume in the right and proximal  transverse colon. Distal transverse and left colon is  decompressed.  Imaging features could be compatible with decreased right colonic  transit.  4. Stable 12 mm left adrenal nodule. This cannot be definitively  characterized but is likely benign given interval stability. 12  month follow-up was recommended on the 07/27/2022 exam which  corresponds to proximally 6 months from today's study.  5. No evidence  for small bowel obstruction.   US abdomen: 1. Status post cholecystectomy.  2. Free fluid within the right upper quadrant and gallbladder fossa.  While findings may be postoperative in etiology, the possibility of  bile leak is not excluded. Consider further evaluation with HIDA  scan as clinically warranted.   Medicines ordered:  I ordered medication including dilaudid x 3, zofran  for symptomatic control Reevaluation of the patient after these medicines showed that the patient improved I have reviewed the patients home medicines and have made adjustments as needed  Consults:  3:44 PM I requested consultation with Dr. Rosendo Gros,  and discussed lab and imaging findings as well as pertinent plan - they recommend: HIDA scan and will admit patient for further management.   Reevaluation:  After the interventions noted above I re-evaluated patient and found that they have :improved   Social Determinants of Health:  The patient's social determinants of health were a factor in the care of this patient   Problem List / ED Course:   patient presents to the ED with a chief complaint of abdominal pain that is been ongoing for the past 2 days.  Reports that she did not have a bowel movement prior to to being admitted for her cholecystectomy,  she did take some MiraLAX yesterday but has not had a bowel movement since.  Is complaining of worsening pain, taking her Norco every 3 hours without any improvement in symptoms. On evaluation abdomen is Peritinic, tender throughout but worse so on the RUQ side, wounds are well apparent without any  purulent discharge or changes in skin. Labs are remarkable with a leukocytosis of 16.0, CMP with no electrolyte derangement.Lipase level is normal. Lactic acid is elevated, code sepsis called given iv zosyn to cover intra-abdominal infection.  CT with the possibility of bile leak, spoke to Dr. Rosendo Gros of general surgery who requested a ultrasound to further evaluate this.  Ultrasound has returned, I did discuss this again with Dr. Mylo Red who would like patient admitted at this time.  Patient will be admitted by general surgery team, patient will need a HIDA scan, this order was placed.  I spoke to the nuclear medicine technician who was at home and reports this HIDA scan will be done tomorrow.  Patient will need to be n.p.o. along with not receiving any narcotic pain medication from midnight on. Patient has received three rounds of dilaudid with no resolution of her pain.   Dispostion:  After consideration of the diagnostic results and the patients response to treatment, I feel that the patent would benefit from admission via general surgery.     Portions of this note were generated with [employer]. Dictation errors may occur despite best attempts at proofreading.   Final Clinical Impression(s) / ED Diagnoses Final diagnoses:  Post-op pain  Right upper quadrant abdominal pain    Rx / DC Orders ED Discharge Orders     None         Janeece Fitting, PA-C 01/27/23 1602    Wyvonnia Dusky, MD 01/28/23 [phone]

## 2023-01-27 NOTE — Progress Notes (Signed)
[facility] following for sepsis protocol. 

## 2023-01-27 NOTE — ED Triage Notes (Signed)
Pt arrived via POV. Pt had lap chole 3x days ago. Pain at surgery site has been increased. Pt has been taking percocet ever 2.5 hours. Nausea, and unable to eat and drink.  AOx4

## 2023-01-27 NOTE — Progress Notes (Signed)
Pharmacy Antibiotic Note  Monica [name] is a 47 y.o. female admitted on 01/27/2023 with  intra-abdominal infection .  Pharmacy has been consulted for Zosyn dosing.  Plan: Zosyn 3.375g IV Q8H infused over 4hrs. Follow up renal function, culture results, and clinical course.      Temp (24hrs), Avg:98 F (36.7 C), Min:97.8 F (36.6 C), Max:98.3 F (36.8 C)  Recent Labs  Lab 01/24/23 0848 01/27/23 1157 01/27/23 1312  WBC 8.3 16.0*  --   CREATININE  --  0.66  --   LATICACIDVEN  --  2.2* 2.0*    Estimated Creatinine Clearance: 105.5 mL/min (by C-G formula based on SCr of 0.66 mg/dL).    No Known Allergies  Antimicrobials this admission: 3/9 Zosyn >>   Dose adjustments this admission:   Microbiology results: 3/9 BCx:   Thank you for allowing pharmacy to be a part of this patient's care.  Gretta Arab PharmD, BCPS [facility] [phone] 01/27/2023 6:20 PM

## 2023-01-27 NOTE — Progress Notes (Signed)
A consult was received from an ED physician for zosyn per pharmacy dosing.  The patient's profile has been reviewed for ht/wt/allergies/indication/available labs.   A one time order has been placed for zosyn 3.375g.  Further antibiotics/pharmacy consults should be ordered by admitting physician if indicated.                       Thank you, Kara Mead 01/27/2023  1:19 PM

## 2023-01-28 ENCOUNTER — Inpatient Hospital Stay (HOSPITAL_COMMUNITY): Payer: Commercial Managed Care - PPO

## 2023-01-28 LAB — PROTIME-INR
INR: 1.3 — ABNORMAL HIGH (ref 0.8–1.2)
Prothrombin Time: 15.9 seconds — ABNORMAL HIGH (ref 11.4–15.2)

## 2023-01-28 LAB — COMPREHENSIVE METABOLIC PANEL
ALT: 36 U/L (ref 0–44)
AST: 27 U/L (ref 15–41)
Albumin: 3.3 g/dL — ABNORMAL LOW (ref 3.5–5.0)
Alkaline Phosphatase: 74 U/L (ref 38–126)
Anion gap: 12 (ref 5–15)
BUN: 10 mg/dL (ref 6–20)
CO2: 24 mmol/L (ref 22–32)
Calcium: 8.6 mg/dL — ABNORMAL LOW (ref 8.9–10.3)
Chloride: 102 mmol/L (ref 98–111)
Creatinine, Ser: 0.61 mg/dL (ref 0.44–1.00)
GFR, Estimated: >60 mL/min (ref >60)
Glucose, Bld: 134 mg/dL — ABNORMAL HIGH (ref 70–99)
Potassium: 3.9 mmol/L (ref 3.5–5.1)
Sodium: 138 mmol/L (ref 135–145)
Total Bilirubin: 1.5 mg/dL — ABNORMAL HIGH (ref 0.3–1.2)
Total Protein: 6.4 g/dL — ABNORMAL LOW (ref 6.5–8.1)

## 2023-01-28 LAB — CBC
HCT: 38.9 % (ref 36.0–46.0)
Hemoglobin: 12.3 g/dL (ref 12.0–15.0)
MCH: 27.6 pg (ref 26.0–34.0)
MCHC: 31.6 g/dL (ref 30.0–36.0)
MCV: 87.4 fL (ref 80.0–100.0)
Platelets: 320 10*3/uL (ref 150–400)
RBC: 4.45 MIL/uL (ref 3.87–5.11)
RDW: 14.7 % (ref 11.5–15.5)
WBC: 10.5 10*3/uL (ref 4.0–10.5)
nRBC: 0 % (ref 0.0–0.2)

## 2023-01-28 LAB — CREATININE, SERUM
Creatinine, Ser: 0.71 mg/dL (ref 0.44–1.00)
GFR, Estimated: >60 mL/min (ref >60)

## 2023-01-28 MED ORDER — MIDAZOLAM HCL 2 MG/2ML IJ SOLN
INTRAMUSCULAR | Status: AC
  Filled 2023-01-28: qty 2

## 2023-01-28 MED ORDER — NALOXONE HCL 0.4 MG/ML IJ SOLN
INTRAMUSCULAR | Status: AC
  Filled 2023-01-28: qty 1

## 2023-01-28 MED ORDER — ENOXAPARIN SODIUM 40 MG/0.4ML IJ SOSY
40.0000 mg | PREFILLED_SYRINGE | INTRAMUSCULAR | Status: DC
  Administered 2023-01-29 – 2023-02-06 (×9): 40 mg via SUBCUTANEOUS
  Filled 2023-01-28 (×9): qty 0.4

## 2023-01-28 MED ORDER — MIDAZOLAM HCL 2 MG/2ML IJ SOLN
INTRAMUSCULAR | Status: AC | PRN
  Administered 2023-01-28: .5 mg via INTRAVENOUS
  Administered 2023-01-28: 1.5 mg via INTRAVENOUS
  Administered 2023-01-28: .5 mg via INTRAVENOUS
  Administered 2023-01-28: 1.5 mg via INTRAVENOUS

## 2023-01-28 MED ORDER — FLUMAZENIL 0.5 MG/5ML IV SOLN
INTRAVENOUS | Status: AC
  Filled 2023-01-28: qty 5

## 2023-01-28 MED ORDER — POLYETHYLENE GLYCOL 3350 17 G PO PACK
17.0000 g | PACK | Freq: Two times a day (BID) | ORAL | Status: DC
  Administered 2023-01-28 – 2023-02-06 (×16): 17 g via ORAL
  Filled 2023-01-28 (×19): qty 1

## 2023-01-28 MED ORDER — FENTANYL CITRATE (PF) 100 MCG/2ML IJ SOLN
INTRAMUSCULAR | Status: AC
  Filled 2023-01-28: qty 2

## 2023-01-28 MED ORDER — FENTANYL CITRATE (PF) 100 MCG/2ML IJ SOLN
INTRAMUSCULAR | Status: AC | PRN
  Administered 2023-01-28: 75 ug via INTRAVENOUS
  Administered 2023-01-28: 25 ug via INTRAVENOUS
  Administered 2023-01-28 (×2): 50 ug via INTRAVENOUS

## 2023-01-28 MED ORDER — TECHNETIUM TC 99M MEBROFENIN IV KIT
5.0800 | PACK | Freq: Once | INTRAVENOUS | Status: AC | PRN
  Administered 2023-01-28: 5.08 via INTRAVENOUS

## 2023-01-28 NOTE — Progress Notes (Addendum)
Pt seen & examined after IR aspiration Husband at [facility] Admission CT and IR CT reviews along with HIDA  Patient had.  Pelvic fluid and fluid in the gallbladder fossa as well as fluid along the right paracolic gutter and in the pelvis.  HIDA is consistent with bile leak.  Patient went for her CT-guided aspiration/drain placement today on repeat CT there was significant decrease in fluid around the liver and gallbladder fossa and now there is a soft tissue fluid collection in the lateral abdominal wall muscle area.  Still with pelvic fluid collection.  I believe the patient significant discomfort overnight and this morning was due to the fluid now being in the right lateral abdominal wall.  No cellulitis or fluctuance on her lateral abdominal wall or induration.  While very atypical I guess the patient evacuated the fluid around the liver and the gallbladder fossa through one of her 5 mm trocar sites.  IR was only able to aspirate about 2 cc.  Dark tan-amber fluid.  Not purulent.  Not frank blood.  Even though color is not frank bilious the amount of fluid that was in her abdomen and the positive HIDA scan is still consistent with a bile leak.  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 Since she is hemodynamically stable the plan would be diagnostic laparoscopy, laparoscopic washout, placement of drains tomorrow.  Will try to bring a drain out through the right lateral abdominal wall through this area of fluid to see if the drain could evacuate any the fluid in the soft tissue.  We did discuss that a percutaneous surgical drain may not completely evacuate the fluid in the abdominal wall and it could eventually turn into an abscess requiring additional procedures like an incision and drainage. As there is less fluid now in the gallbladder fossa hopefully what ever was going on has sealed but I did discuss with the patient and her husband that a drain would be attempted the  left in the gallbladder fossa to monitor for bile leak.  If there was evidence of an ongoing bile leak that was large-volume and this would necessitate ERCP.  We briefly discussed ERCP in gastric bypass anatomy.  I reviewed the imaging with the patient and her husband and showed them pictures and discussed the typical remaining hospital course.  Patient will have bariatric clear liquids tonight, n.p.o. after midnight, continue IV antibiotic pain control  Leighton Ruff. Redmond Pulling, MD, FACS General, Bariatric, & Minimally Invasive Surgery [facility],  [place]

## 2023-01-28 NOTE — Progress Notes (Cosign Needed)
  Transition of Care [facility]) Screening Note   Patient Details  Name: Monica [name] Date of Birth: 10-30-1976   Transition of Care [facility]) CM/SW Contact:    Henrietta Dine, RN Phone Number: 01/28/2023, 5:53 PM    Transition of Care Department [facility]) has reviewed patient and no TOC needs have been identified at this time. We will continue to monitor patient advancement through interdisciplinary progression rounds. If new patient transition needs arise, please place a TOC consult.

## 2023-01-28 NOTE — Progress Notes (Signed)
Patient ID: Monica [name], female   DOB: May 05, 1976, 47 y.o.   MRN: [id number] Request received for abd drain placement on pt. Imaging findings d/w Dr. Earleen Newport. Will tent plan case for 3/11. If drain needed emergently please contact Dr. Earleen Newport (on call IR attending) at [phone].

## 2023-01-28 NOTE — Progress Notes (Signed)
NT informed nurse that the pt had the following vital signs: temp 99.6, BP-109/68, MAP-82, heart rate-129, sats 81% room air. Nurse entered pt's room. Noted that she is warm, dry, no visible distress. Applied o2 '@2lpm'$  via [place]- sats 96% and heart rate down to 115. Encouraged pt to keep oxygen on, for now. She stated "okay".

## 2023-01-28 NOTE — Consult Note (Signed)
[facility] Gastroenterology Consult  Referring Provider: Dr. Ramirez/general surgery Primary Care Physician:  Pa, [place] Primary Gastroenterologist: Sadie Haber primary  Reason for Consultation: Bile leak  HPI: Monica [name] is a 47 y.o. female was in her usual state of health until the evening after laparoscopic cholecystectomy on 01/25/2023 when she developed severe abdominal pain. She called her surgeon Dr. Thermon Leyland, who prescribed her oxycodone, however patient noted that even taking oxycodone every 2-3 hours did not improve her pain.  She was eventually advised to proceed to the ER.  Patient has had nausea and vomiting but no fever.  Pain is generalized but more focused in the right upper and right lower quadrant. Patient states that this is the worst pain she has ever had an alive, it worsens with any movement and is minimally responsive to pain medications. Patient has not had a bowel movement in several days.  Prior GI workup: EGD 05/31/2022  Robot-assisted gastric bypass, Roux-en-Y with hiatal hernia repair and robotic assisted appendectomy on 08/07/2022. Patient reports about 88 pound weight loss since gastric bypass procedure.  Patient states she has had a colonoscopy more than 10 years ago in [place] for change in bowel habits which was reported as normal.   Past Medical History:  Diagnosis Date   AMA (advanced maternal age) multigravida 35+    Anxiety    Asthma    Depression    had PP depression   HSV (herpes simplex virus) anogenital infection    Hx of HELLP syndrome, currently pregnant    PONV (postoperative nausea and vomiting)    Vaginal Pap smear, abnormal     Past Surgical History:  Procedure Laterality Date   ADENOIDECTOMY     APPENDECTOMY     BIOPSY  05/31/2022   Procedure: BIOPSY;  Surgeon: Felicie Morn, MD;  Location: [facility];  Service: General;;   bone spur removal     CESAREAN SECTION  2013   CESAREAN SECTION N/A  10/20/2017   Procedure: REPEAT CESAREAN SECTION WITH SUPRA CLITORAL SKIN TAG REMOVAL;  Surgeon: Marylynn Pearson, MD;  Location: [facility];  Service: Obstetrics;  Laterality: N/A;  Repeat edc 11/05/17 nkda    CESAREAN SECTION WITH BILATERAL TUBAL LIGATION N/A 03/15/2020   Procedure: CESAREAN SECTION WITH BILATERAL TUBAL LIGATION;  Surgeon: Tyson Dense, MD;  Location: [place] [facility];  Service: Obstetrics;  Laterality: N/A;   CHOLECYSTECTOMY N/A 01/25/2023   Procedure: LAPAROSCOPIC CHOLECYSTECTOMY WITH INTRAOPERATIVE CHOLANGIOGRAM;  Surgeon: Felicie Morn, MD;  Location: [facility];  Service: General;  Laterality: N/A;   COLPOSCOPY     ESOPHAGOGASTRODUODENOSCOPY N/A 05/31/2022   Procedure: ESOPHAGOGASTRODUODENOSCOPY (EGD);  Surgeon: Felicie Morn, MD;  Location: [facility];  Service: General;  Laterality: N/A;   HIATAL HERNIA REPAIR N/A 08/07/2022   Procedure: HIATAL HERNIA REPAIR;  Surgeon: Felicie Morn, MD;  Location: [facility];  Service: General;  Laterality: N/A;   LAPAROSCOPIC APPENDECTOMY N/A 08/07/2022   Procedure: XI ROBOTI ASSISTED APPENDECTOMY;  Surgeon: Felicie Morn, MD;  Location: [facility];  Service: General;  Laterality: N/A;   LASIK     tonsil suregery     additional tissue removal   TONSILLECTOMY      Prior to Admission medications   Medication Sig Start Date End Date Taking? Authorizing Provider  calcium carbonate (TUMS - DOSED IN MG ELEMENTAL CALCIUM) 500 MG chewable tablet Chew 1 tablet by mouth 3 (three) times daily.    [provider]  Cholecalciferol (VITAMIN  D-3 PO) Take 1 capsule by mouth daily.    [provider]  fluticasone (FLONASE) 50 MCG/ACT nasal spray Place 1-2 sprays into both nostrils daily as needed for allergies.    [provider]  gabapentin (NEURONTIN) 100 MG capsule Take 2 capsules (200 mg total) by mouth every 12 (twelve) hours. Patient taking differently: Take 200 mg by mouth every 12  (twelve) hours as needed (pain). 08/09/22   Stechschulte, Nickola Major, MD  ketotifen (ZADITOR) 0.025 % ophthalmic solution Place 1 drop into both eyes daily as needed (itchy eyes).    [provider]  levocetirizine (XYZAL) 5 MG tablet Take 5 mg by mouth every evening.    [provider]  Multiple Vitamins-Minerals (CELEBRATE MULTI-COMPLETE 45 PO) Take 2 tablets by mouth daily.    [provider]  ondansetron (ZOFRAN-ODT) 4 MG disintegrating tablet Dissolve 1 tablet (4 mg total) by mouth every 6 (six) hours as needed for nausea or vomiting. 08/09/22   Stechschulte, Nickola Major, MD  oxyCODONE-acetaminophen (PERCOCET) 5-325 MG tablet Take 1 tablet by mouth every 4 (four) hours as needed for severe pain. 01/25/23 01/25/24  Stechschulte, Nickola Major, MD  polyethylene glycol (MIRALAX) 17 g packet Take 17 g by mouth daily as needed for mild constipation. 07/10/22   Meuth, Blaine Hamper, PA-C  valACYclovir (VALTREX) 500 MG tablet Take 500 mg by mouth daily. 12/24/21   [provider]    Current Facility-Administered Medications  Medication Dose Route Frequency Provider Last Rate Last Admin   dextrose 5 %-0.9 % sodium chloride infusion   Intravenous Continuous Ralene Ok, MD 100 mL/hr at 01/27/23 1757 New Bag at 01/27/23 1757   enoxaparin (LOVENOX) injection 40 mg  40 mg Subcutaneous Q24H Ralene Ok, MD   40 mg at 01/27/23 2125   HYDROcodone-acetaminophen (NORCO/VICODIN) 5-325 MG per tablet 1-2 tablet  1-2 tablet Oral Q4H PRN Ralene Ok, MD   2 tablet at 01/27/23 2125   HYDROmorphone (DILAUDID) injection 0.5-1 mg  0.5-1 mg Intravenous Q2H PRN Ralene Ok, MD   1 mg at 01/28/23 1023   ondansetron (ZOFRAN) tablet 4 mg  4 mg Oral Q6H PRN Ralene Ok, MD       Or   ondansetron [facility]) injection 4 mg  4 mg Intravenous Q6H PRN Ralene Ok, MD   4 mg at 01/28/23 1030   piperacillin-tazobactam (ZOSYN) IVPB 3.375 g  3.375 g Intravenous Q8H Shade, Christine E, RPH 12.5 mL/hr  at 01/28/23 0616 3.375 g at 01/28/23 0616    Allergies as of 01/27/2023   (No Known Allergies)    Family History  Problem Relation Age of Onset   Hypertension Mother    Hashimoto's thyroiditis Mother    Hypertension Father    Skin cancer Paternal Grandfather    Stroke Paternal Grandfather    Allergic rhinitis Neg Hx    Angioedema Neg Hx    Asthma Neg Hx    Atopy Neg Hx    Eczema Neg Hx    Immunodeficiency Neg Hx    Urticaria Neg Hx     Social History   Socioeconomic History   Marital status: Married    Spouse name: Not on file   Number of children: Not on file   Years of education: Not on file   Highest education level: Not on file  Occupational History   Not on file  Tobacco Use   Smoking status: Never   Smokeless tobacco: Never  Vaping Use   Vaping Use: Never used  Substance and Sexual Activity   Alcohol use: No   Drug use: No   Sexual activity: Yes    Birth control/protection: None  Other Topics Concern   Not on file  Social History Narrative   Not on file   Social Determinants of Health   Financial Resource Strain: Not on file  Food Insecurity: Unknown (01/27/2023)   Hunger Vital Sign    Worried About Running Out of Food in the Last Year: Patient refused    [place] in the Last Year: Patient refused  Transportation Needs: No Transportation Needs (01/27/2023)   PRAPARE - Hydrologist (Medical): No    Lack of Transportation (Non-Medical): No  Physical Activity: Not on file  Stress: Not on file  Social Connections: Not on file  Intimate Partner Violence: Not At Risk (01/27/2023)   Humiliation, Afraid, Rape, and Kick questionnaire    Fear of Current or Ex-Partner: No    Emotionally Abused: No    Physically Abused: No    Sexually Abused: No    Review of Systems: As per HPI  Physical Exam: Vital signs in last 24 hours: Temp:  [97.4 F (36.3 C)-99.6 F (37.6 C)] 97.4 F (36.3 C) (03/10 1028) Pulse Rate:  [79-145]  145 (03/10 1030) Resp:  [10-20] 20 (03/10 0652) BP: (105-135)/(65-95) 127/89 (03/10 1028) SpO2:  [81 %-98 %] 93 % (03/10 1030) Last BM Date :  (No BM as per patient.)  General:   Very tearful, anxious, in obvious pain Head:  Normocephalic and atraumatic. Eyes:  Sclera clear, no icterus.   Conjunctiva pink. Ears:  Normal auditory acuity. Nose:  No deformity, discharge,  or lesions. Mouth:  No deformity or lesions.  Oropharynx pink & moist. Neck:  Supple; no masses or thyromegaly. Lungs:  Clear throughout to auscultation.   No wheezes, crackles, or rhonchi. No acute distress. Heart:  Regular rate and rhythm; no murmurs, clicks, rubs,  or gallops. Extremities:  Without clubbing or edema. Neurologic:  Alert and  oriented x4;  grossly normal neurologically. Skin:  Intact without significant lesions or rashes. Psych:  Alert and cooperative. Normal mood and affect. Abdomen: Diffusely tender, presence of rebound tenderness, hypoactive bowel sounds         Lab Results: Recent Labs    01/27/23 1157 01/27/23 1800 01/28/23 0522  WBC 16.0* 11.0* 10.5  HGB 14.8 13.3 12.3  HCT 45.9 41.6 38.9  PLT 438* 264 320   BMET Recent Labs    01/27/23 1157 01/27/23 1800 01/28/23 0522  NA 136  --  138  K 3.7  --  3.9  CL 99  --  102  CO2 27  --  24  GLUCOSE 148*  --  134*  BUN 9  --  10  CREATININE 0.66 0.71 0.61  CALCIUM 9.0  --  8.6*   LFT Recent Labs    01/28/23 0522  PROT 6.4*  ALBUMIN 3.3*  AST 27  ALT 36  ALKPHOS 74  BILITOT 1.5*   PT/INR No results for input(s): "LABPROT", "INR" in the last 72 hours.  Studies/Results: NM HEPATOBILIARY LEAK (POST-SURGICAL)  Result Date: 01/28/2023 CLINICAL DATA:  47 year old female status post cholecystectomy on 01/25/2023. Suspected bile leak. EXAM: NUCLEAR MEDICINE HEPATOBILIARY IMAGING TECHNIQUE: Sequential images of the abdomen were obtained out to 60 minutes following intravenous administration of radiopharmaceutical.  RADIOPHARMACEUTICALS:  5.08 mCi Tc-30m Choletec IV COMPARISON:  No prior hepatobiliary scan. CT of the abdomen and pelvis  01/27/2023. Abdominal ultrasound 01/27/2023. FINDINGS: Prompt uptake and biliary excretion of activity by the liver is seen. Early in the examination at the time of opacification of the common hepatic duct and common bile duct there is accumulation of radiotracer activity beneath the right lobe of the liver in the expected location of the gallbladder fossa. This activity appears to pull in the right lower quadrant of the abdomen and is persistent on delayed imaging. Biliary activity passes into small bowel, consistent with patent common bile duct. IMPRESSION: 1. Findings, as above, compatible with bile leak. 2. Common bile duct is patent. These results will be called to the ordering clinician or representative by the Radiologist Assistant, and communication documented in the PACS or [employer]. Electronically Signed   By: Vinnie Langton M.D.   On: 01/28/2023 10:27   US Abdomen Limited  Result Date: 01/27/2023 CLINICAL DATA:  Right upper quadrant abdominal pain. Prior cholecystectomy EXAM: ULTRASOUND ABDOMEN LIMITED RIGHT UPPER QUADRANT COMPARISON:  CT abdomen and pelvis earlier same day FINDINGS: Gallbladder: Surgically absent Common bile duct: Diameter: 7 mm Liver: No focal lesion identified. Within normal limits in parenchymal echogenicity. Portal vein is patent on color Doppler imaging with normal direction of blood flow towards the liver. Other: Free fluid is demonstrated within the gallbladder fossa as well as in the perihepatic location. IMPRESSION: 1. Status post cholecystectomy. 2. Free fluid within the right upper quadrant and gallbladder fossa. While findings may be postoperative in etiology, the possibility of bile leak is not excluded. Consider further evaluation with HIDA scan as clinically warranted. Electronically Signed   By: Lovey Newcomer M.D.   On: 01/27/2023 15:25    CT ABDOMEN PELVIS W CONTRAST  Result Date: 01/27/2023 CLINICAL DATA:  Postop cholecystectomy 2 days ago with worsening abdominal pain. EXAM: CT ABDOMEN AND PELVIS WITH CONTRAST TECHNIQUE: Multidetector CT imaging of the abdomen and pelvis was performed using the standard protocol following bolus administration of intravenous contrast. RADIATION DOSE REDUCTION: This exam was performed according to the departmental dose-optimization program which includes automated exposure control, adjustment of the mA and/or kV according to patient size and/or use of iterative reconstruction technique. CONTRAST:  134m OMNIPAQUE IOHEXOL 300 MG/ML  SOLN COMPARISON:  07/27/2022 FINDINGS: Lower chest: Basilar atelectasis noted both lower lobes. Hepatobiliary: No suspicious focal abnormality within the liver parenchyma. Free fluid is seen anterior and lateral to the liver with some free air under the non dependent right-sided hemidiaphragm. Fluid collection identified in the gallbladder fossa with fluid tracking in Morison's pouch. Gallbladder surgically absent. Common bile duct measures up to 9 mm diameter in the head of the pancreas. Pancreas: No focal mass lesion. No dilatation of the main duct. No intraparenchymal cyst. No peripancreatic edema. Spleen: No splenomegaly. No focal mass lesion. Adrenals/Urinary Tract: 12 mm left adrenal nodule is stable since prior study. Right adrenal gland unremarkable. Kidneys unremarkable. No evidence for hydroureter. The urinary bladder appears normal for the degree of distention. Stomach/Bowel: Status post gastric bypass. Excluded portion of the stomach is filled with gas and fluid. No small bowel dilatation to suggest obstruction. The terminal ileum is normal. The appendix is not well visualized, but there is no edema or inflammation in the region of the cecum. Moderate to large stool volume noted in the right and proximal transverse colon. Distal transverse and left colon is decompressed.  Sigmoid colon and rectum unremarkable. Vascular/Lymphatic: No abdominal aortic aneurysm. No abdominal lymphadenopathy No pelvic sidewall lymphadenopathy. Reproductive: Unremarkable. Other: Small volume free fluid seen in  the right paracolic gutter with small to moderate volume free fluid in the pelvis. Small volume intraperitoneal free gas not unexpected 2 days upper cholecystectomy Musculoskeletal: Trace gas is identified in the subcutaneous fat of the anterior abdominal wall compatible with recent surgery. No worrisome lytic or sclerotic osseous abnormality. IMPRESSION: 1. Status post cholecystectomy. Fluid collection identified along the lateral and anterior surface of the liver as well as in the gallbladder fossa tracking into Morison's pouch. Small volume free fluid in the right paracolic gutter with small to moderate volume free fluid in the pelvis. Bile leak not excluded. 2. Small volume intraperitoneal free gas not unexpected 2 days upper cholecystectomy. 3. Moderate to large stool volume in the right and proximal transverse colon. Distal transverse and left colon is decompressed. Imaging features could be compatible with decreased right colonic transit. 4. Stable 12 mm left adrenal nodule. This cannot be definitively characterized but is likely benign given interval stability. 12 month follow-up was recommended on the 07/27/2022 exam which corresponds to proximally 6 months from today's study. 5. No evidence for small bowel obstruction. Electronically Signed   By: Misty Stanley M.D.   On: 01/27/2023 14:02    Impression: Bile leak, status post laparoscopic cholecystectomy on 01/25/23  CAT scan shows fluid collection along the lateral and anterior surface of liver as well as gallbladder fossa tracking into Morison's pouch with small volume fluid in right paracolic gutter and small to moderate volume free fluid in pelvis  Constipation-likely related to narcotic use, will order for MiraLAX 17 g twice  daily.  Plan: As patient has a gastric bypass anatomy ERCP with stent placement will be very challenging and can only be performed by an advanced gastroenterologist. Patient in frank pain with symptoms of biliary peritonitis, recommend IR evaluation for drainage and catheter placement. Once catheter is placed, monitor bile output, if decreasing or resolved patient may not need ERCP. However if bile output postdrainage continues to remain significant or increases, will need ERCP with an advanced gastroenterologist. Discussed the same with the patient and surgeon Dr. Rosendo Gros. I will order for IR evaluation and management as stated above. Dr. Alessandra Bevels to follow from tomorrow onwards.   LOS: 1 day   Ronnette Juniper, MD  01/28/2023, 12:04 PM

## 2023-01-28 NOTE — Consult Note (Signed)
Chief Complaint: Bile leak  Referring Physician(s): Dr. Therisa Doyne  Supervising Physician: Corrie Mckusick  Patient Status: [facility] - In-pt  History of Present Illness: Monica [name] is a 47 y.o. 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 She had ruptured appendicitis before this, and an appendectomy was performed at the time of gastric bypass. She has known gallstones. Recently she has had some nausea while eating. Last Wednesday she had a severe abdominal pain attack in the right upper quadrant. She originally thought the nausea problem was related to her gastric bypass anatomy, but now realizes it is probably all due to her gallbladder.   She presented for elective GB surgery 01/25/23.   She returned via the Ed 01/27/23 with pain.   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   The intra-op cholangiogram is unremarkable.  Possible source from the accessory ducts of luschka.    Given her prior bypass, endo is not preferred.   We are seeing her for possible CT guided drainage.   Past Medical History:  Diagnosis Date   AMA (advanced maternal age) multigravida 35+    Anxiety    Asthma    Depression    had PP depression   HSV (herpes simplex virus) anogenital infection    Hx of HELLP syndrome, currently pregnant    PONV (postoperative nausea and vomiting)    Vaginal Pap smear, abnormal     Past Surgical History:  Procedure Laterality Date   ADENOIDECTOMY     APPENDECTOMY     BIOPSY  05/31/2022   Procedure: BIOPSY;  Surgeon: Felicie Morn, MD;  Location: [facility];  Service: General;;   bone spur removal     CESAREAN SECTION  2013   CESAREAN SECTION N/A 10/20/2017   Procedure: REPEAT CESAREAN SECTION WITH SUPRA CLITORAL SKIN TAG REMOVAL;   Surgeon: Marylynn Pearson, MD;  Location: [place];  Service: Obstetrics;  Laterality: N/A;  Repeat edc 11/05/17 nkda    CESAREAN SECTION WITH BILATERAL TUBAL LIGATION N/A 03/15/2020   Procedure: CESAREAN SECTION WITH BILATERAL TUBAL LIGATION;  Surgeon: Tyson Dense, MD;  Location: [place] [facility];  Service: Obstetrics;  Laterality: N/A;   CHOLECYSTECTOMY N/A 01/25/2023   Procedure: LAPAROSCOPIC CHOLECYSTECTOMY WITH INTRAOPERATIVE CHOLANGIOGRAM;  Surgeon: Felicie Morn, MD;  Location: [facility];  Service: General;  Laterality: N/A;   COLPOSCOPY     ESOPHAGOGASTRODUODENOSCOPY N/A 05/31/2022   Procedure: ESOPHAGOGASTRODUODENOSCOPY (EGD);  Surgeon: Felicie Morn, MD;  Location: [facility];  Service: General;  Laterality: N/A;   HIATAL HERNIA REPAIR N/A 08/07/2022   Procedure: HIATAL HERNIA REPAIR;  Surgeon: Felicie Morn, MD;  Location: [facility];  Service: General;  Laterality: N/A;   LAPAROSCOPIC APPENDECTOMY N/A 08/07/2022   Procedure: XI ROBOTI ASSISTED APPENDECTOMY;  Surgeon: Felicie Morn, MD;  Location: [facility];  Service: General;  Laterality: N/A;   LASIK     tonsil suregery     additional tissue removal   TONSILLECTOMY      Allergies: Patient has no known allergies.  Medications: Prior to Admission medications   Medication Sig Start Date End Date Taking? Authorizing Provider  calcium carbonate (TUMS - DOSED IN MG ELEMENTAL CALCIUM) 500 MG chewable tablet Chew 1 tablet by mouth 3 (three) times daily.  Yes [provider]  Cholecalciferol (VITAMIN D-3 PO) Take 1 capsule by mouth daily.   Yes [provider]  fluticasone (FLONASE) 50 MCG/ACT nasal spray Place 1-2 sprays into both nostrils daily as needed for allergies.   Yes [provider]  ketotifen (ZADITOR) 0.025 % ophthalmic solution Place 1 drop into both eyes daily as needed (itchy eyes).   Yes [provider]  levocetirizine (XYZAL) 5 MG tablet Take 5 mg  by mouth every evening.   Yes [provider]  Multiple Vitamins-Minerals (CELEBRATE MULTI-COMPLETE 45 PO) Take 2 tablets by mouth daily.   Yes [provider]  ondansetron (ZOFRAN-ODT) 4 MG disintegrating tablet Dissolve 1 tablet (4 mg total) by mouth every 6 (six) hours as needed for nausea or vomiting. 08/09/22  Yes Stechschulte, Nickola Major, MD  oxyCODONE-acetaminophen (PERCOCET) 5-325 MG tablet Take 1 tablet by mouth every 4 (four) hours as needed for severe pain. 01/25/23 01/25/24 Yes Stechschulte, Nickola Major, MD  polyethylene glycol (MIRALAX) 17 g packet Take 17 g by mouth daily as needed for mild constipation. 07/10/22  Yes Meuth, Brooke A, PA-C  valACYclovir (VALTREX) 500 MG tablet Take 500 mg by mouth daily. 12/24/21  Yes [provider]  gabapentin (NEURONTIN) 100 MG capsule Take 2 capsules (200 mg total) by mouth every 12 (twelve) hours. Patient not taking: Reported on 01/28/2023 08/09/22   Stechschulte, Nickola Major, MD     Family History  Problem Relation Age of Onset   Hypertension Mother    Hashimoto's thyroiditis Mother    Hypertension Father    Skin cancer Paternal Grandfather    Stroke Paternal Grandfather    Allergic rhinitis Neg Hx    Angioedema Neg Hx    Asthma Neg Hx    Atopy Neg Hx    Eczema Neg Hx    Immunodeficiency Neg Hx    Urticaria Neg Hx     Social History   Socioeconomic History   Marital status: Married    Spouse name: Not on file   Number of children: Not on file   Years of education: Not on file   Highest education level: Not on file  Occupational History   Not on file  Tobacco Use   Smoking status: Never   Smokeless tobacco: Never  Vaping Use   Vaping Use: Never used  Substance and Sexual Activity   Alcohol use: No   Drug use: No   Sexual activity: Yes    Birth control/protection: None  Other Topics Concern   Not on file  Social History Narrative   Not on file   Social Determinants of Health   Financial Resource Strain: Not on  file  Food Insecurity: Unknown (01/27/2023)   Hunger Vital Sign    Worried About Running Out of Food in the Last Year: Patient refused    Ran Out of Food in the Last Year: Patient refused  Transportation Needs: No Transportation Needs (01/27/2023)   PRAPARE - Hydrologist (Medical): No    Lack of Transportation (Non-Medical): No  Physical Activity: Not on file  Stress: Not on file  Social Connections: Not on file      Review of Systems: A 12 point ROS discussed and pertinent positives are indicated in the HPI above.  All other systems are negative.  Review of Systems  Vital Signs: BP 115/75 (BP Location: Right Arm)   Pulse (!) 104   Temp 98.4 F (36.9 C) (Oral)   Resp  20   LMP 01/03/2023 Comment: negative pregnancy test  SpO2 98%     Physical Exam General: 47 yo female appearing stated age. Uncomfortable in bed 1315 at [facility].  HEENT: Atraumatic, normocephalic.  Conjugate gaze, extra-ocular motor intact. No scleral icterus or scleral injection. No lesions on external ears, nose, lips, or gums.  Oral mucosa moist, pink.  Neck: Symmetric with no goiter enlargement.  Chest/Lungs:  Symmetric chest with inspiration/expiration.  No labored breathing.   Heart:   No JVD appreciated.  Abdomen:  TTP Genito-urinary: Deferred Neurologic: Alert & Oriented to person, place, and time.   Normal affect and insight.  Appropriate questions.  Moving all 4 extremities with gross sensory intact.    Imaging: NM HEPATOBILIARY LEAK (POST-SURGICAL)  Result Date: 01/28/2023 CLINICAL DATA:  47 year old female status post cholecystectomy on 01/25/2023. Suspected bile leak. EXAM: NUCLEAR MEDICINE HEPATOBILIARY IMAGING TECHNIQUE: Sequential images of the abdomen were obtained out to 60 minutes following intravenous administration of radiopharmaceutical. RADIOPHARMACEUTICALS:  5.08 mCi Tc-48m Choletec IV COMPARISON:  No prior hepatobiliary scan. CT of the abdomen and pelvis  01/27/2023. Abdominal ultrasound 01/27/2023. FINDINGS: Prompt uptake and biliary excretion of activity by the liver is seen. Early in the examination at the time of opacification of the common hepatic duct and common bile duct there is accumulation of radiotracer activity beneath the right lobe of the liver in the expected location of the gallbladder fossa. This activity appears to pull in the right lower quadrant of the abdomen and is persistent on delayed imaging. Biliary activity passes into small bowel, consistent with patent common bile duct. IMPRESSION: 1. Findings, as above, compatible with bile leak. 2. Common bile duct is patent. These results will be called to the ordering clinician or representative by the Radiologist Assistant, and communication documented in the PACS or [employer] Electronically Signed   By: DVinnie LangtonM.D.   On: 01/28/2023 10:27   UKoreaAbdomen Limited  Result Date: 01/27/2023 CLINICAL DATA:  Right upper quadrant abdominal pain. Prior cholecystectomy EXAM: ULTRASOUND ABDOMEN LIMITED RIGHT UPPER QUADRANT COMPARISON:  CT abdomen and pelvis earlier same day FINDINGS: Gallbladder: Surgically absent Common bile duct: Diameter: 7 mm Liver: No focal lesion identified. Within normal limits in parenchymal echogenicity. Portal vein is patent on color Doppler imaging with normal direction of blood flow towards the liver. Other: Free fluid is demonstrated within the gallbladder fossa as well as in the perihepatic location. IMPRESSION: 1. Status post cholecystectomy. 2. Free fluid within the right upper quadrant and gallbladder fossa. While findings may be postoperative in etiology, the possibility of bile leak is not excluded. Consider further evaluation with HIDA scan as clinically warranted. Electronically Signed   By: DLovey NewcomerM.D.   On: 01/27/2023 15:25   CT ABDOMEN PELVIS W CONTRAST  Result Date: 01/27/2023 CLINICAL DATA:  Postop cholecystectomy 2 days ago with worsening  abdominal pain. EXAM: CT ABDOMEN AND PELVIS WITH CONTRAST TECHNIQUE: Multidetector CT imaging of the abdomen and pelvis was performed using the standard protocol following bolus administration of intravenous contrast. RADIATION DOSE REDUCTION: This exam was performed according to the departmental dose-optimization program which includes automated exposure control, adjustment of the mA and/or kV according to patient size and/or use of iterative reconstruction technique. CONTRAST:  1025mOMNIPAQUE IOHEXOL 300 MG/ML  SOLN COMPARISON:  07/27/2022 FINDINGS: Lower chest: Basilar atelectasis noted both lower lobes. Hepatobiliary: No suspicious focal abnormality within the liver parenchyma. Free fluid is seen anterior and lateral to the liver with some free  air under the non dependent right-sided hemidiaphragm. Fluid collection identified in the gallbladder fossa with fluid tracking in Morison's pouch. Gallbladder surgically absent. Common bile duct measures up to 9 mm diameter in the head of the pancreas. Pancreas: No focal mass lesion. No dilatation of the main duct. No intraparenchymal cyst. No peripancreatic edema. Spleen: No splenomegaly. No focal mass lesion. Adrenals/Urinary Tract: 12 mm left adrenal nodule is stable since prior study. Right adrenal gland unremarkable. Kidneys unremarkable. No evidence for hydroureter. The urinary bladder appears normal for the degree of distention. Stomach/Bowel: Status post gastric bypass. Excluded portion of the stomach is filled with gas and fluid. No small bowel dilatation to suggest obstruction. The terminal ileum is normal. The appendix is not well visualized, but there is no edema or inflammation in the region of the cecum. Moderate to large stool volume noted in the right and proximal transverse colon. Distal transverse and left colon is decompressed. Sigmoid colon and rectum unremarkable. Vascular/Lymphatic: No abdominal aortic aneurysm. No abdominal lymphadenopathy No  pelvic sidewall lymphadenopathy. Reproductive: Unremarkable. Other: Small volume free fluid seen in the right paracolic gutter with small to moderate volume free fluid in the pelvis. Small volume intraperitoneal free gas not unexpected 2 days upper cholecystectomy Musculoskeletal: Trace gas is identified in the subcutaneous fat of the anterior abdominal wall compatible with recent surgery. No worrisome lytic or sclerotic osseous abnormality. IMPRESSION: 1. Status post cholecystectomy. Fluid collection identified along the lateral and anterior surface of the liver as well as in the gallbladder fossa tracking into Morison's pouch. Small volume free fluid in the right paracolic gutter with small to moderate volume free fluid in the pelvis. Bile leak not excluded. 2. Small volume intraperitoneal free gas not unexpected 2 days upper cholecystectomy. 3. Moderate to large stool volume in the right and proximal transverse colon. Distal transverse and left colon is decompressed. Imaging features could be compatible with decreased right colonic transit. 4. Stable 12 mm left adrenal nodule. This cannot be definitively characterized but is likely benign given interval stability. 12 month follow-up was recommended on the 07/27/2022 exam which corresponds to proximally 6 months from today's study. 5. No evidence for small bowel obstruction. Electronically Signed   By: Misty Stanley M.D.   On: 01/27/2023 14:02   DG Cholangiogram Operative  Result Date: 01/25/2023 CLINICAL DATA:  Cholecystectomy for symptomatic cholelithiasis. EXAM: INTRAOPERATIVE CHOLANGIOGRAM TECHNIQUE: Cholangiographic images from the C-arm fluoroscopic device were submitted for interpretation post-operatively. Please see the procedural report for the amount of contrast and the fluoroscopy time utilized. FLUOROSCOPY: Radiation Exposure Index (as provided by the fluoroscopic device): 1.8 mGy Kerma COMPARISON:  CT of the abdomen and pelvis on 07/27/2022  FINDINGS: Intraoperative imaging with a C-arm demonstrates normal opacified cystic duct stump, common bile duct and visualized intrahepatic ducts. No evidence of biliary dilatation, stricture, filling defect or contrast extravasation. Contrast enters the duodenum normally. IMPRESSION: Normal intraoperative cholangiogram. Electronically Signed   By: Aletta Edouard M.D.   On: 01/25/2023 08:25    Labs:  CBC: Recent Labs    01/24/23 0848 01/27/23 1157 01/27/23 1800 01/28/23 0522  WBC 8.3 16.0* 11.0* 10.5  HGB 11.9* 14.8 13.3 12.3  HCT 37.3 45.9 41.6 38.9  PLT 293 438* 264 320    COAGS: Recent Labs    01/28/23 1411  INR 1.3*    BMP: Recent Labs    07/11/22 0126 07/31/22 1046 08/07/22 1903 01/27/23 1157 01/27/23 1800 01/28/23 0522  NA 138 138  --  136  --  138  K 3.8 4.2  --  3.7  --  3.9  CL 105 106  --  99  --  102  CO2 25 26  --  27  --  24  GLUCOSE 98 104*  --  148*  --  134*  BUN 7 15  --  9  --  10  CALCIUM 8.8* 9.1  --  9.0  --  8.6*  CREATININE 0.88 0.70 0.67 0.66 0.71 0.61  GFRNONAA >60 >60 >60 >60 >60 >60    LIVER FUNCTION TESTS: Recent Labs    07/06/22 1445 07/31/22 1046 01/27/23 1157 01/28/23 0522  BILITOT 0.5 0.4 1.3* 1.5*  AST 13* 16 37 27  ALT 8 14 45* 36  ALKPHOS 80 71 88 74  PROT 8.0 7.4 7.9 6.4*  ALBUMIN 4.5 4.3 4.4 3.3*    TUMOR MARKERS: No results for input(s): "AFPTM", "CEA", "CA199", "CHROMGRNA" in the last 8760 hours.  Assessment and Plan:  47 yo female with presumed biloma/bile leak SP cholecystectomy POD 3.  Likely from accessory ducts as above.   Plan for CT guided drainage.    Risks and benefits discussed with the patient including bleeding, infection, damage to adjacent structures, bowel perforation/fistula connection, and sepsis.  All of the patient's questions were answered, patient is agreeable to proceed. Consent signed and in chart.   Thank you for this interesting consult.  I greatly enjoyed meeting Monica [name] and look forward to participating in their care.  A copy of this report was sent to the requesting provider on this date.  Electronically Signed: Corrie Mckusick, DO 01/28/2023, 4:00 PM   I spent a total of 40 Minutes    in face to face in clinical consultation, greater than 50% of which was counseling/coordinating care for subheptic biloma, bile leak, possible drainage.

## 2023-01-28 NOTE — Progress Notes (Signed)
Subjective/Chief Complaint: Pt with some con't abd pain HIDA this AM   Objective: Vital signs in last 24 hours: Temp:  [97.8 F (36.6 C)-99.6 F (37.6 C)] 99.6 F (37.6 C) (03/10 0652) Pulse Rate:  [79-129] 115 (03/10 0700) Resp:  [10-32] 20 (03/10 0652) BP: (105-147)/(65-109) 109/68 (03/10 0652) SpO2:  [81 %-99 %] 96 % (03/10 0700) Last BM Date :  (No BM as per patient.)  Intake/Output from previous day: 03/09 0701 - 03/10 0700 In: 751 [I.V.:700.9; IV Piggyback:50] Out: 320 [Urine:320] Intake/Output this shift: No intake/output data recorded.  General appearance: alert and cooperative GI: ttp generalized, soft, no guarding, some rebound tenderness  Lab Results:  Recent Labs    01/27/23 1800 01/28/23 0522  WBC 11.0* 10.5  HGB 13.3 12.3  HCT 41.6 38.9  PLT 264 320   BMET Recent Labs    01/27/23 1157 01/27/23 1800 01/28/23 0522  NA 136  --  138  K 3.7  --  3.9  CL 99  --  102  CO2 27  --  24  GLUCOSE 148*  --  134*  BUN 9  --  10  CREATININE 0.66 0.71 0.61  CALCIUM 9.0  --  8.6*   PT/INR No results for input(s): "LABPROT", "INR" in the last 72 hours. ABG No results for input(s): "PHART", "HCO3" in the last 72 hours.  Invalid input(s): "PCO2", "PO2"  Studies/Results: US Abdomen Limited  Result Date: 01/27/2023 CLINICAL DATA:  Right upper quadrant abdominal pain. Prior cholecystectomy EXAM: ULTRASOUND ABDOMEN LIMITED RIGHT UPPER QUADRANT COMPARISON:  CT abdomen and pelvis earlier same day FINDINGS: Gallbladder: Surgically absent Common bile duct: Diameter: 7 mm Liver: No focal lesion identified. Within normal limits in parenchymal echogenicity. Portal vein is patent on color Doppler imaging with normal direction of blood flow towards the liver. Other: Free fluid is demonstrated within the gallbladder fossa as well as in the perihepatic location. IMPRESSION: 1. Status post cholecystectomy. 2. Free fluid within the right upper quadrant and gallbladder  fossa. While findings may be postoperative in etiology, the possibility of bile leak is not excluded. Consider further evaluation with HIDA scan as clinically warranted. Electronically Signed   By: Lovey Newcomer M.D.   On: 01/27/2023 15:25   CT ABDOMEN PELVIS W CONTRAST  Result Date: 01/27/2023 CLINICAL DATA:  Postop cholecystectomy 2 days ago with worsening abdominal pain. EXAM: CT ABDOMEN AND PELVIS WITH CONTRAST TECHNIQUE: Multidetector CT imaging of the abdomen and pelvis was performed using the standard protocol following bolus administration of intravenous contrast. RADIATION DOSE REDUCTION: This exam was performed according to the departmental dose-optimization program which includes automated exposure control, adjustment of the mA and/or kV according to patient size and/or use of iterative reconstruction technique. CONTRAST:  1109m OMNIPAQUE IOHEXOL 300 MG/ML  SOLN COMPARISON:  07/27/2022 FINDINGS: Lower chest: Basilar atelectasis noted both lower lobes. Hepatobiliary: No suspicious focal abnormality within the liver parenchyma. Free fluid is seen anterior and lateral to the liver with some free air under the non dependent right-sided hemidiaphragm. Fluid collection identified in the gallbladder fossa with fluid tracking in Morison's pouch. Gallbladder surgically absent. Common bile duct measures up to 9 mm diameter in the head of the pancreas. Pancreas: No focal mass lesion. No dilatation of the main duct. No intraparenchymal cyst. No peripancreatic edema. Spleen: No splenomegaly. No focal mass lesion. Adrenals/Urinary Tract: 12 mm left adrenal nodule is stable since prior study. Right adrenal gland unremarkable. Kidneys unremarkable. No evidence for hydroureter. The urinary bladder  appears normal for the degree of distention. Stomach/Bowel: Status post gastric bypass. Excluded portion of the stomach is filled with gas and fluid. No small bowel dilatation to suggest obstruction. The terminal ileum is  normal. The appendix is not well visualized, but there is no edema or inflammation in the region of the cecum. Moderate to large stool volume noted in the right and proximal transverse colon. Distal transverse and left colon is decompressed. Sigmoid colon and rectum unremarkable. Vascular/Lymphatic: No abdominal aortic aneurysm. No abdominal lymphadenopathy No pelvic sidewall lymphadenopathy. Reproductive: Unremarkable. Other: Small volume free fluid seen in the right paracolic gutter with small to moderate volume free fluid in the pelvis. Small volume intraperitoneal free gas not unexpected 2 days upper cholecystectomy Musculoskeletal: Trace gas is identified in the subcutaneous fat of the anterior abdominal wall compatible with recent surgery. No worrisome lytic or sclerotic osseous abnormality. IMPRESSION: 1. Status post cholecystectomy. Fluid collection identified along the lateral and anterior surface of the liver as well as in the gallbladder fossa tracking into Morison's pouch. Small volume free fluid in the right paracolic gutter with small to moderate volume free fluid in the pelvis. Bile leak not excluded. 2. Small volume intraperitoneal free gas not unexpected 2 days upper cholecystectomy. 3. Moderate to large stool volume in the right and proximal transverse colon. Distal transverse and left colon is decompressed. Imaging features could be compatible with decreased right colonic transit. 4. Stable 12 mm left adrenal nodule. This cannot be definitively characterized but is likely benign given interval stability. 12 month follow-up was recommended on the 07/27/2022 exam which corresponds to proximally 6 months from today's study. 5. No evidence for small bowel obstruction. Electronically Signed   By: Misty Stanley M.D.   On: 01/27/2023 14:02    Anti-infectives: Anti-infectives (From admission, onward)    Start     Dose/Rate Route Frequency Ordered Stop   01/27/23 2200  piperacillin-tazobactam (ZOSYN)  IVPB 3.375 g        3.375 g 12.5 mL/hr over 240 Minutes Intravenous Every 8 hours 01/27/23 1822     01/27/23 1330  piperacillin-tazobactam (ZOSYN) IVPB 3.375 g        3.375 g 12.5 mL/hr over 240 Minutes Intravenous  Once 01/27/23 1321 01/27/23 1715       Assessment/Plan: 47 year old female status post lap chole, history of gastric bypass, likely bile leak   1.  We will await HIDA scan to confirm bile leak however patient appears to have this based on ultrasound CT scan and physical exam. 2. Continue with Zosyn 3. Dr. Therisa Doyne to see  4. Continue NPO for now. 5.  Pain control  LOS: 1 day    Ralene Ok 01/28/2023

## 2023-01-28 NOTE — Procedures (Addendum)
Interventional Radiology Procedure Note  Procedure:   Plan for image guided drainage of subhepatic fluid/biloma.    Ultimately, US guided aspiration of body wall fluid was performed.   Findings: CT shows no significant fluid at the GB fossa, resolved from prior.  New large fluid/edema within the abdominal wall muscles of the right abdomen.  Aspiration of ~2-3cc of dark thin amber fluid.  Does not appear to be frank hematoma or infection. Possible bile. Will send for tbili   Similar fluid collection in the pelvis.   Specimen:  sent for tbili  Complications: None  Recommendations:  - Routine wound care - follow up lab   Signed,  Dulcy Fanny. Earleen Newport, DO

## 2023-01-29 ENCOUNTER — Other Ambulatory Visit: Payer: Self-pay

## 2023-01-29 ENCOUNTER — Inpatient Hospital Stay (HOSPITAL_COMMUNITY): Payer: Commercial Managed Care - PPO | Admitting: Certified Registered"

## 2023-01-29 ENCOUNTER — Encounter (HOSPITAL_COMMUNITY)
Admission: EM | Disposition: A | Discharge status: Home / Self Care | Payer: Self-pay | Source: Home / Self Care | Attending: Surgery

## 2023-01-29 ENCOUNTER — Encounter (HOSPITAL_COMMUNITY): Payer: Self-pay | Admitting: General Surgery

## 2023-01-29 DIAGNOSIS — K9189 Other postprocedural complications and disorders of digestive system: Secondary | ICD-10-CM

## 2023-01-29 HISTORY — PX: LAPAROSCOPY: SHX197

## 2023-01-29 SURGERY — LAPAROSCOPY, DIAGNOSTIC
Anesthesia: General

## 2023-01-29 MED ORDER — OXYCODONE HCL 5 MG/5ML PO SOLN: 5.0000 mg | Freq: Once | ORAL | Status: DC | PRN

## 2023-01-29 MED ORDER — ONDANSETRON HCL 4 MG/2ML IJ SOLN
INTRAMUSCULAR | Status: DC | PRN
  Administered 2023-01-29: 4 mg via INTRAVENOUS

## 2023-01-29 MED ORDER — SUCCINYLCHOLINE CHLORIDE 200 MG/10ML IV SOSY
PREFILLED_SYRINGE | INTRAVENOUS | Status: AC
  Filled 2023-01-29: qty 10

## 2023-01-29 MED ORDER — LACTATED RINGERS IR SOLN
Status: DC | PRN
  Administered 2023-01-29: 1000 mL

## 2023-01-29 MED ORDER — DEXAMETHASONE SODIUM PHOSPHATE 10 MG/ML IJ SOLN
INTRAMUSCULAR | Status: AC
  Filled 2023-01-29: qty 1

## 2023-01-29 MED ORDER — ONDANSETRON HCL 4 MG/2ML IJ SOLN
INTRAMUSCULAR | Status: AC
  Filled 2023-01-29: qty 2

## 2023-01-29 MED ORDER — BUPIVACAINE-EPINEPHRINE (PF) 0.25% -1:200000 IJ SOLN
INTRAMUSCULAR | Status: AC
  Filled 2023-01-29: qty 30

## 2023-01-29 MED ORDER — SUGAMMADEX SODIUM 200 MG/2ML IV SOLN
INTRAVENOUS | Status: DC | PRN
  Administered 2023-01-29: 200 mg via INTRAVENOUS

## 2023-01-29 MED ORDER — ROCURONIUM BROMIDE 10 MG/ML (PF) SYRINGE
PREFILLED_SYRINGE | INTRAVENOUS | Status: AC
  Filled 2023-01-29: qty 10

## 2023-01-29 MED ORDER — ACETAMINOPHEN 10 MG/ML IV SOLN
INTRAVENOUS | Status: DC | PRN
  Administered 2023-01-29: 1000 mg via INTRAVENOUS

## 2023-01-29 MED ORDER — DEXAMETHASONE SODIUM PHOSPHATE 10 MG/ML IJ SOLN
INTRAMUSCULAR | Status: DC | PRN
  Administered 2023-01-29: 10 mg via INTRAVENOUS

## 2023-01-29 MED ORDER — MIDAZOLAM HCL 5 MG/5ML IJ SOLN
INTRAMUSCULAR | Status: DC | PRN
  Administered 2023-01-29: 2 mg via INTRAVENOUS

## 2023-01-29 MED ORDER — SCOPOLAMINE 1 MG/3DAYS TD PT72
MEDICATED_PATCH | TRANSDERMAL | Status: AC
  Filled 2023-01-29: qty 1

## 2023-01-29 MED ORDER — BUPIVACAINE-EPINEPHRINE 0.25% -1:200000 IJ SOLN
INTRAMUSCULAR | Status: DC | PRN
  Administered 2023-01-29: 20 mL

## 2023-01-29 MED ORDER — METOCLOPRAMIDE HCL 5 MG/ML IJ SOLN
10.0000 mg | Freq: Three times a day (TID) | INTRAMUSCULAR | Status: DC
  Administered 2023-01-29 – 2023-02-07 (×26): 10 mg via INTRAVENOUS
  Filled 2023-01-29 (×26): qty 2

## 2023-01-29 MED ORDER — MIDAZOLAM HCL 2 MG/2ML IJ SOLN
INTRAMUSCULAR | Status: AC
  Filled 2023-01-29: qty 2

## 2023-01-29 MED ORDER — ONDANSETRON HCL 4 MG/2ML IJ SOLN
4.0000 mg | Freq: Once | INTRAMUSCULAR | Status: AC | PRN
  Administered 2023-01-29: 4 mg via INTRAVENOUS

## 2023-01-29 MED ORDER — PROCHLORPERAZINE EDISYLATE 10 MG/2ML IJ SOLN
10.0000 mg | INTRAMUSCULAR | Status: DC | PRN
  Administered 2023-01-29 – 2023-02-03 (×5): 10 mg via INTRAVENOUS
  Filled 2023-01-29 (×5): qty 2

## 2023-01-29 MED ORDER — FENTANYL CITRATE (PF) 100 MCG/2ML IJ SOLN
INTRAMUSCULAR | Status: AC
  Filled 2023-01-29: qty 2

## 2023-01-29 MED ORDER — HYDROMORPHONE HCL 2 MG/ML IJ SOLN
INTRAMUSCULAR | Status: AC
  Filled 2023-01-29: qty 1

## 2023-01-29 MED ORDER — LACTATED RINGERS IV SOLN: INTRAVENOUS | Status: DC | PRN

## 2023-01-29 MED ORDER — ACETAMINOPHEN 10 MG/ML IV SOLN
INTRAVENOUS | Status: AC
  Filled 2023-01-29: qty 100

## 2023-01-29 MED ORDER — SUCCINYLCHOLINE CHLORIDE 200 MG/10ML IV SOSY
PREFILLED_SYRINGE | INTRAVENOUS | Status: DC | PRN
  Administered 2023-01-29: 100 mg via INTRAVENOUS

## 2023-01-29 MED ORDER — VALACYCLOVIR HCL 500 MG PO TABS
500.0000 mg | ORAL_TABLET | Freq: Every day | ORAL | Status: DC
  Administered 2023-01-29 – 2023-02-07 (×10): 500 mg via ORAL
  Filled 2023-01-29 (×10): qty 1

## 2023-01-29 MED ORDER — FENTANYL CITRATE (PF) 100 MCG/2ML IJ SOLN
INTRAMUSCULAR | Status: DC | PRN
  Administered 2023-01-29 (×2): 50 ug via INTRAVENOUS

## 2023-01-29 MED ORDER — FLUTICASONE PROPIONATE 50 MCG/ACT NA SUSP: 1.0000 | Freq: Every day | NASAL | Status: DC | PRN

## 2023-01-29 MED ORDER — HYDROMORPHONE HCL 1 MG/ML IJ SOLN
INTRAMUSCULAR | Status: AC
  Administered 2023-01-29: 0.5 mg via INTRAVENOUS
  Filled 2023-01-29: qty 2

## 2023-01-29 MED ORDER — PROPOFOL 10 MG/ML IV BOLUS
INTRAVENOUS | Status: AC
  Filled 2023-01-29: qty 20

## 2023-01-29 MED ORDER — SCOPOLAMINE 1 MG/3DAYS TD PT72
MEDICATED_PATCH | TRANSDERMAL | Status: DC | PRN
  Administered 2023-01-29: 1 via TRANSDERMAL

## 2023-01-29 MED ORDER — HYDROMORPHONE HCL 1 MG/ML IJ SOLN
0.2500 mg | INTRAMUSCULAR | Status: DC | PRN
  Administered 2023-01-29 (×3): 0.5 mg via INTRAVENOUS

## 2023-01-29 MED ORDER — 0.9 % SODIUM CHLORIDE (POUR BTL) OPTIME
TOPICAL | Status: DC | PRN
  Administered 2023-01-29: 3000 mL
  Administered 2023-01-29: 1000 mL
  Administered 2023-01-29: 3000 mL

## 2023-01-29 MED ORDER — OXYCODONE HCL 5 MG PO TABS: 5.0000 mg | ORAL_TABLET | Freq: Once | ORAL | Status: DC | PRN

## 2023-01-29 MED ORDER — KETAMINE HCL 50 MG/5ML IJ SOSY
PREFILLED_SYRINGE | INTRAMUSCULAR | Status: AC
  Filled 2023-01-29: qty 5

## 2023-01-29 MED ORDER — PROPOFOL 10 MG/ML IV BOLUS
INTRAVENOUS | Status: DC | PRN
  Administered 2023-01-29: 20 mg via INTRAVENOUS
  Administered 2023-01-29: 120 mg via INTRAVENOUS

## 2023-01-29 MED ORDER — DROPERIDOL 2.5 MG/ML IJ SOLN
INTRAMUSCULAR | Status: DC | PRN
  Administered 2023-01-29: 1.25 mg via INTRAVENOUS

## 2023-01-29 MED ORDER — ROCURONIUM BROMIDE 10 MG/ML (PF) SYRINGE
PREFILLED_SYRINGE | INTRAVENOUS | Status: DC | PRN
  Administered 2023-01-29: 40 mg via INTRAVENOUS
  Administered 2023-01-29: 20 mg via INTRAVENOUS
  Administered 2023-01-29: 40 mg via INTRAVENOUS

## 2023-01-29 MED ORDER — DROPERIDOL 2.5 MG/ML IJ SOLN
INTRAMUSCULAR | Status: AC
  Filled 2023-01-29: qty 2

## 2023-01-29 MED ORDER — KETAMINE HCL 10 MG/ML IJ SOLN
INTRAMUSCULAR | Status: DC | PRN
  Administered 2023-01-29 (×2): 10 mg via INTRAVENOUS

## 2023-01-29 MED ORDER — LIDOCAINE 2% (20 MG/ML) 5 ML SYRINGE
INTRAMUSCULAR | Status: DC | PRN
  Administered 2023-01-29: 90 mg via INTRAVENOUS

## 2023-01-29 SURGICAL SUPPLY — 68 items
ADH SKN CLS APL DERMABOND .7 (GAUZE/BANDAGES/DRESSINGS) ×1
APL PRP STRL LF DISP 70% ISPRP (MISCELLANEOUS) ×1
APPLIER CLIP 5 13 M/L LIGAMAX5 (MISCELLANEOUS)
APPLIER CLIP ROT 10 11.4 M/L (STAPLE)
APR CLP MED LRG 11.4X10 (STAPLE)
APR CLP MED LRG 5 ANG JAW (MISCELLANEOUS)
BAG COUNTER SPONGE SURGICOUNT (BAG) IMPLANT
BAG SPNG CNTER NS LX DISP (BAG)
BLADE EXTENDED COATED 6.5IN (ELECTRODE) IMPLANT
BLADE SURG SZ10 CARB STEEL (BLADE) IMPLANT
CELLS DAT CNTRL 66122 CELL SVR (MISCELLANEOUS) IMPLANT
CHLORAPREP W/TINT 26 (MISCELLANEOUS) ×1 IMPLANT
CLIP APPLIE 5 13 M/L LIGAMAX5 (MISCELLANEOUS) IMPLANT
CLIP APPLIE ROT 10 11.4 M/L (STAPLE) IMPLANT
COVER MAYO STAND STRL (DRAPES) IMPLANT
COVER SURGICAL LIGHT HANDLE (MISCELLANEOUS) ×1 IMPLANT
DERMABOND ADVANCED .7 DNX12 (GAUZE/BANDAGES/DRESSINGS) IMPLANT
DRAIN CHANNEL 15F RND FF 3/16 (WOUND CARE) IMPLANT
DRAIN CHANNEL 19F RND (DRAIN) IMPLANT
DRAPE SHEET LG 3/4 BI-LAMINATE (DRAPES) IMPLANT
DRAPE WARM FLUID 44X44 (DRAPES) IMPLANT
ELECT REM PT RETURN 15FT ADLT (MISCELLANEOUS) ×1 IMPLANT
EVACUATOR SILICONE 100CC (DRAIN) IMPLANT
GAUZE SPONGE 4X4 12PLY STRL (GAUZE/BANDAGES/DRESSINGS) ×1 IMPLANT
GLOVE BIO SURGEON STRL SZ7.5 (GLOVE) ×1 IMPLANT
GLOVE BIOGEL PI IND STRL 8 (GLOVE) ×1 IMPLANT
GLOVE PI ORTHO PRO STRL 7.5 (GLOVE) ×1 IMPLANT
GOWN STRL REUS W/ TWL XL LVL3 (GOWN DISPOSABLE) ×4 IMPLANT
GOWN STRL REUS W/TWL XL LVL3 (GOWN DISPOSABLE) ×4
HANDLE SUCTION POOLE (INSTRUMENTS) IMPLANT
IRRIG SUCT STRYKERFLOW 2 WTIP (MISCELLANEOUS)
IRRIGATION SUCT STRKRFLW 2 WTP (MISCELLANEOUS) IMPLANT
KIT BASIN OR (CUSTOM PROCEDURE TRAY) ×1 IMPLANT
KIT TURNOVER KIT A (KITS) IMPLANT
LEGGING LITHOTOMY PAIR STRL (DRAPES) IMPLANT
NDL INSUFFLATION 14GA 120MM (NEEDLE) IMPLANT
NEEDLE INSUFFLATION 14GA 120MM (NEEDLE) ×1 IMPLANT
RETRACTOR WND ALEXIS 18 MED (MISCELLANEOUS) IMPLANT
RTRCTR WOUND ALEXIS 18CM MED (MISCELLANEOUS)
SCISSORS LAP 5X35 DISP (ENDOMECHANICALS) ×1 IMPLANT
SHEARS HARMONIC ACE PLUS 36CM (ENDOMECHANICALS) IMPLANT
SLEEVE Z-THREAD 5X100MM (TROCAR) ×1 IMPLANT
SPIKE FLUID TRANSFER (MISCELLANEOUS) ×1 IMPLANT
STAPLER VISISTAT 35W (STAPLE) IMPLANT
STRIP CLOSURE SKIN 1/2X4 (GAUZE/BANDAGES/DRESSINGS) IMPLANT
SUCTION POOLE HANDLE (INSTRUMENTS)
SUT ETHILON 2 0 PS N (SUTURE) IMPLANT
SUT PDS AB 1 TP1 96 (SUTURE) IMPLANT
SUT PROLENE 2 0 KS (SUTURE) IMPLANT
SUT PROLENE 2 0 SH DA (SUTURE) IMPLANT
SUT SILK 2 0 (SUTURE)
SUT SILK 2 0 SH CR/8 (SUTURE) IMPLANT
SUT SILK 2-0 18XBRD TIE 12 (SUTURE) IMPLANT
SUT SILK 3 0 (SUTURE)
SUT SILK 3 0 SH CR/8 (SUTURE) IMPLANT
SUT SILK 3-0 18XBRD TIE 12 (SUTURE) IMPLANT
SYR BULB IRRIG 60ML STRL (SYRINGE) IMPLANT
SYS LAPSCP GELPORT 120MM (MISCELLANEOUS)
SYSTEM LAPSCP GELPORT 120MM (MISCELLANEOUS) IMPLANT
TOWEL OR 17X26 10 PK STRL BLUE (TOWEL DISPOSABLE) ×1 IMPLANT
TOWEL OR NON WOVEN STRL DISP B (DISPOSABLE) ×1 IMPLANT
TRAY FOLEY MTR SLVR 16FR STAT (SET/KITS/TRAYS/PACK) ×1 IMPLANT
TRAY LAPAROSCOPIC (CUSTOM PROCEDURE TRAY) ×1 IMPLANT
TROCAR 11X100 Z THREAD (TROCAR) IMPLANT
TROCAR 5M 150ML BLDLS (TROCAR) IMPLANT
TROCAR Z-THREAD OPTICAL 5X100M (TROCAR) ×1 IMPLANT
TUBING CONNECTING 10 (TUBING) IMPLANT
YANKAUER SUCT BULB TIP NO VENT (SUCTIONS) IMPLANT

## 2023-01-29 NOTE — Progress Notes (Signed)
[facility] Gastroenterology Progress Note  Monica [name] 47 y.o. September 27, 1976  CC: Bile leak.   Subjective: Recent events noted.  Unable to undergo IR guided drainage yesterday.  Underwent diagnostic laparoscopy with washout and drain placement this morning and was found to have bile peritonitis.  Patient seen and examined in PACU.  Somewhat lethargic.  ROS : unable to obtain   Objective: Vital signs in last 24 hours: Vitals:   01/29/23 0900 01/29/23 0915  BP: (!) 161/106   Pulse: (!) 111 (!) 104  Resp: (!) 21 17  Temp:    SpO2: 100% 99%    Physical Exam:  General -alert but somewhat lethargic. No visible respiratory distress Abdomen  - generalized discomfort on palpation mild distention, bowel sounds present, no peritoneal signs.  Drains in place.  Lab Results: Recent Labs    01/27/23 1157 01/27/23 1800 01/28/23 0522  NA 136  --  138  K 3.7  --  3.9  CL 99  --  102  CO2 27  --  24  GLUCOSE 148*  --  134*  BUN 9  --  10  CREATININE 0.66 0.71 0.61  CALCIUM 9.0  --  8.6*   Recent Labs    01/27/23 1157 01/28/23 0522  AST 37 27  ALT 45* 36  ALKPHOS 88 74  BILITOT 1.3* 1.5*  PROT 7.9 6.4*  ALBUMIN 4.4 3.3*   Recent Labs    01/27/23 1157 01/27/23 1800 01/28/23 0522  WBC 16.0* 11.0* 10.5  NEUTROABS 13.5*  --   --   HGB 14.8 13.3 12.3  HCT 45.9 41.6 38.9  MCV 85.8 86.0 87.4  PLT 438* 264 320   Recent Labs    01/28/23 1411  LABPROT 15.9*  INR 1.3*      Assessment/Plan: -Bile leak possible bile peritonitis.  Unable to do IR guided drainage.  S/p diagnostic laparoscopy with washout and drain placement today. -History of Roux-en-Y gastric bypass -Constipation  Recommendations ------------------------- -Further management per surgical team. -GI will follow   Otis Brace MD, FACP 01/29/2023, 9:22 AM  Contact #  [phone]

## 2023-01-29 NOTE — H&P (Signed)
Please see Dr. Rosendo Gros note on 01/27/23 - labeled progress - which will serve as the H+P for this readmission.

## 2023-01-29 NOTE — Progress Notes (Signed)
Received call from short stay, to get the pt ready for surgery. Shared with her that nurse doesn't see an order for getting a consent signed. She stated okay. She asked that pt get a CHG bath. Informed her that she will have one. Nurse also shared with staff in short stay that she will be getting Dilaudid for pain around 0540 and that she will be getting her scheduled Zosyn. She stated okay and that they will be up in the next 30 minutes to get her.

## 2023-01-29 NOTE — Transfer of Care (Signed)
Immediate Anesthesia Transfer of Care Note  Patient: Monica [name]  Procedure(s) Performed: LAPAROSCOPY DIAGNOSTIC WITH WASHOUT AND DRAIN PLACEMENT  Patient Location: PACU  Anesthesia Type:General  Level of Consciousness: awake, alert , oriented, and patient cooperative  Airway & Oxygen Therapy: Patient Spontanous Breathing and Patient connected to face mask oxygen  Post-op Assessment: Report given to RN and Post -op Vital signs reviewed and stable  Post vital signs: Reviewed and stable  Last Vitals:  Vitals Value Taken Time  BP 148/94 01/29/23 0851  Temp    Pulse 110 01/29/23 0854  Resp 17 01/29/23 0854  SpO2 100 % 01/29/23 0854  Vitals shown include unvalidated device data.  Last Pain:  Vitals:   01/29/23 0642  TempSrc: Oral  PainSc:       Patients Stated Pain Goal: 2 ([id number])  Complications: No notable events documented.

## 2023-01-29 NOTE — Anesthesia Preprocedure Evaluation (Signed)
Anesthesia Evaluation  Patient identified by MRN, date of birth, ID band Patient awake    Reviewed: Allergy & Precautions, H&P , NPO status , Patient's Chart, lab work & pertinent test results, reviewed documented beta blocker date and time   History of Anesthesia Complications (+) PONV and history of anesthetic complications  Airway Mallampati: II  TM Distance: >3 FB Neck ROM: Full    Dental no notable dental hx.    Pulmonary neg pulmonary ROS   Pulmonary exam normal breath sounds clear to auscultation       Cardiovascular negative cardio ROS Normal cardiovascular exam Rhythm:Regular Rate:Normal     Neuro/Psych negative neurological ROS  negative psych ROS   GI/Hepatic negative GI ROS, Neg liver ROS,,,  Endo/Other  negative endocrine ROS    Renal/GU negative Renal ROS  negative genitourinary   Musculoskeletal negative musculoskeletal ROS (+)    Abdominal   Peds negative pediatric ROS (+)  Hematology negative hematology ROS (+)   Anesthesia Other Findings   Reproductive/Obstetrics negative OB ROS                             Anesthesia Physical Anesthesia Plan  ASA: 2  Anesthesia Plan: General   Post-op Pain Management:    Induction: Intravenous and Rapid sequence  PONV Risk Score and Plan: 4 or greater and Ondansetron, Dexamethasone, Midazolam, Scopolamine patch - Pre-op, Droperidol and Treatment may vary due to age or medical condition  Airway Management Planned: Oral ETT  Additional Equipment:   Intra-op Plan:   Post-operative Plan: Extubation in OR  Informed Consent: I have reviewed the patients History and Physical, chart, labs and discussed the procedure including the risks, benefits and alternatives for the proposed anesthesia with the patient or authorized representative who has indicated his/her understanding and acceptance.     Dental advisory given  Plan  Discussed with: CRNA and Surgeon  Anesthesia Plan Comments:        Anesthesia Quick Evaluation

## 2023-01-29 NOTE — Anesthesia Postprocedure Evaluation (Signed)
Anesthesia Post Note  Patient: Monica [name]  Procedure(s) Performed: LAPAROSCOPY DIAGNOSTIC WITH WASHOUT AND DRAIN PLACEMENT     Patient location during evaluation: PACU Anesthesia Type: General Level of consciousness: awake and alert Pain management: pain level controlled Vital Signs Assessment: post-procedure vital signs reviewed and stable Respiratory status: spontaneous breathing, nonlabored ventilation, respiratory function stable and patient connected to nasal cannula oxygen Cardiovascular status: blood pressure returned to baseline and stable Postop Assessment: no apparent nausea or vomiting Anesthetic complications: no  No notable events documented.  Last Vitals:  Vitals:   01/29/23 0928 01/29/23 0930  BP: (!) 143/97 (!) 143/97  Pulse: (!) 106 (!) 107  Resp: 19 17  Temp:    SpO2: 97% 98%    Last Pain:  Vitals:   01/29/23 0930  TempSrc:   PainSc: Asleep                 Tramaine Sauls S

## 2023-01-29 NOTE — Anesthesia Procedure Notes (Signed)
Procedure Name: Intubation Date/Time: 01/29/2023 7:28 AM  Performed by: Cleda Daub, CRNAPre-anesthesia Checklist: Patient identified, Emergency Drugs available, Suction available and Patient being monitored Patient Re-evaluated:Patient Re-evaluated prior to induction Oxygen Delivery Method: Circle system utilized Preoxygenation: Pre-oxygenation with 100% oxygen Induction Type: IV induction Ventilation: Mask ventilation without difficulty Laryngoscope Size: Mac and 3 Grade View: Grade II Tube type: Oral Number of attempts: 1 Airway Equipment and Method: Stylet and Oral airway Placement Confirmation: ETT inserted through vocal cords under direct vision, positive ETCO2 and breath sounds checked- equal and bilateral Secured at: 21 cm Tube secured with: Tape Dental Injury: Teeth and Oropharynx as per pre-operative assessment

## 2023-01-29 NOTE — Op Note (Signed)
Patient: Monica [name] (12-04-1975, [id number])  Date of Surgery: 01/29/23  Preoperative Diagnosis: Bile leak   Postoperative Diagnosis: Bile leak   Surgical Procedure: LAPAROSCOPY DIAGNOSTIC WITH WASHOUT AND DRAIN PLACEMENT: [id number] (CPT)   Operative Team Members:  Surgeon(s) and Role:    * Sanjith Siwek, Nickola Major, MD - Primary    * Maczis, Carlena Hurl, PA-C - Assisting   Anesthesiologist: Myrtie Soman, MD CRNA: Victoriano Lain, CRNA; Cleda Daub, CRNA   Anesthesia: General   Fluids:  Total I/O In: 100 [IV Piggyback:100] Out: 5 [id number]  Complications: None  Drains:  (3) Jackson-Pratt drain(s) with closed bulb suction  Right lower drain - tunneling through abdominal wall fluid collection and draining above the left lobe of the liver Right upper drain - draining infrahepatic space near gallbladder fossa Left drain - draining pelvis  Specimen: None  Disposition:  PACU - hemodynamically stable.  Plan of Care: Continue inpatient care    Indications for Procedure:  Monica [name] underwent laparoscopic cholecystectomy with intraoperative cholangiogram on 01/25/23. She was readmitted over the weekend with a bile leak, unable to be drained by IR. It appears it has built up between the external and internal oblique muscles on the right abdominal wall. I recommended diagnostic laparoscopy with intraperitoneal drain placement and drainage of right abdominal wall fluid collection. We discussed the procedure, its risks, benefits and alternatives and the patient granted consent to proceed. We will proceed urgently to the OR.   Findings: Bile peritonitis   Description of Procedure:   On the date stated above the patient was taken operating room suite and placed in supine position.  General endotracheal anesthesia was induced.  A timeout completed verifying the correct patient, procedure, position, and equipment needed for the case.  Patient's abdomen was prepped and draped in  usual sterile fashion.  I reopened the recent 5 mm incision just below the umbilicus.  I insufflated the abdomen with a Veress needle to 15 mmHg.  A 5 mm trocar was placed in this position and the laparoscope inserted.  There is no trauma to the underlying viscera with initial trocar placement.  The abdomen was inspected.  There was bile peritonitis and bowel throughout the abdomen.  A trocar was placed through the right abdominal wall, trying to tunnel from the lower portion of the abdominal wall to the upper portion of the abdominal wall to tunnel through the intramuscular fluid collection.  A 19 French drain was brought through this and placed above the right lobe of the liver.  Another 5 mm trocar was placed at the previous lateral 5 mm trocar in the right abdomen.  A 19 [place] JP drain was brought through this and placed in for hepatically near the gallbladder fossa.  I did not lift the liver or inspect the gallbladder fossa as I did not disturb any healing that was going on in that area.  A 5 mm trocar was placed in the left lower quadrant.  This was used to suction the abdomen clean.  7 L of fluid were instilled into the abdomen and suctioned out of the drains.  A 19 [place] JP drain was placed through the left lower quadrant port site and directed into the pelvis.  The abdomen was desufflated and the drains were suctioned.  A 4-0 Monocryl suture and Dermabond was applied to the umbilical port site.  All sponge needle counts were correct at the end this case.  The patient was awoken from anesthesia and transferred the  postanesthesia care unit in stable condition.   At the end of the case we reviewed the infection status of the case. Patient: Private Patient Urgent Case Case: Urgent Infection Present At Time Of Surgery (PATOS): None  Louanna Raw, MD General, Bariatric, & Minimally Invasive Surgery [facility] Surgery, [place]

## 2023-01-29 NOTE — Progress Notes (Signed)
Progress Note: General Surgery Service   Chief Complaint/Subjective: Severe abdominal pain  Objective: Vital signs in last 24 hours: Temp:  [97.4 F (36.3 C)-99.4 F (37.4 C)] 99.4 F (37.4 C) (03/11 0642) Pulse Rate:  [99-145] 119 (03/11 0642) Resp:  [14-24] 20 (03/11 0642) BP: (109-141)/(75-97) 120/88 (03/11 0642) SpO2:  [82 %-98 %] 92 % (03/11 0642) Last BM Date :  (No BM as per patient.)  Intake/Output from previous day: 03/10 0701 - 03/11 0700 In: 1688.5 [P.O.:120; I.V.:1439.5; IV Piggyback:129.1] Out: 600 [Urine:600] Intake/Output this shift: No intake/output data recorded.  Constitutional: NAD; conversant; no deformities Eyes: Moist conjunctiva; no lid lag; anicteric; PERRL Neck: Trachea midline; no thyromegaly Lungs: Normal respiratory effort; no tactile fremitus CV: RRR; no palpable thrills; no pitting edema GI: Abd severe abdominal pain.  Pain on right side worse and fullness on right side of abdomen MSK: Normal range of motion of extremities; no clubbing/cyanosis Psychiatric: Appropriate affect; alert and oriented x3 Lymphatic: No palpable cervical or axillary lymphadenopathy  Lab Results: CBC  Recent Labs    01/27/23 1800 01/28/23 0522  WBC 11.0* 10.5  HGB 13.3 12.3  HCT 41.6 38.9  PLT 264 320   BMET Recent Labs    01/27/23 1157 01/27/23 1800 01/28/23 0522  NA 136  --  138  K 3.7  --  3.9  CL 99  --  102  CO2 27  --  24  GLUCOSE 148*  --  134*  BUN 9  --  10  CREATININE 0.66 0.71 0.61  CALCIUM 9.0  --  8.6*   PT/INR Recent Labs    01/28/23 1411  LABPROT 15.9*  INR 1.3*   ABG No results for input(s): "PHART", "HCO3" in the last 72 hours.  Invalid input(s): "PCO2", "PO2"  Anti-infectives: Anti-infectives (From admission, onward)    Start     Dose/Rate Route Frequency Ordered Stop   01/27/23 2200  [MAR Hold]  piperacillin-tazobactam (ZOSYN) IVPB 3.375 g        (MAR Hold since Mon 01/29/2023 at 0638.Hold Reason: Transfer to a  Procedural area)   3.375 g 12.5 mL/hr over 240 Minutes Intravenous Every 8 hours 01/27/23 1822     01/27/23 1330  piperacillin-tazobactam (ZOSYN) IVPB 3.375 g        3.375 g 12.5 mL/hr over 240 Minutes Intravenous  Once 01/27/23 1321 01/27/23 1715       Medications: Scheduled Meds:  [MAR Hold] enoxaparin (LOVENOX) injection  40 mg Subcutaneous Q24H   [MAR Hold] polyethylene glycol  17 g Oral BID   Continuous Infusions:  dextrose 5 % and 0.9% NaCl 100 mL/hr at 01/28/23 2103   [MAR Hold] piperacillin-tazobactam (ZOSYN)  IV 3.375 g (01/29/23 0536)   PRN Meds:.[MAR Hold] HYDROcodone-acetaminophen, [MAR Hold]  HYDROmorphone (DILAUDID) injection, [MAR Hold] ondansetron **OR** [MAR Hold] ondansetron (ZOFRAN) IV  Assessment/Plan: s/p Procedure(s): LAPAROSCOPY DIAGNOSTIC WITH WASHOUT 01/29/2023  Ms. Yearick underwent laparoscopic cholecystectomy with intraoperative cholangiogram on 01/25/23.  She was readmitted over the weekend with a bile leak, unable to be drained by IR.  It appears it has built up between the external and internal oblique muscles on the right abdominal wall.  I recommended diagnostic laparoscopy with intraperitoneal drain placement and drainage of right abdominal wall fluid collection.  We discussed the procedure, its risks, benefits and alternatives and the patient granted consent to proceed.  We will proceed urgently to the OR.   LOS: 2 days    Felicie Morn, [facility] Use [email or web address] to contact on call provider  Daily Billing: [phone] - post op

## 2023-01-30 ENCOUNTER — Encounter (HOSPITAL_COMMUNITY): Payer: Self-pay | Admitting: Surgery

## 2023-01-30 LAB — COMPREHENSIVE METABOLIC PANEL
ALT: 19 U/L (ref 0–44)
AST: 12 U/L — ABNORMAL LOW (ref 15–41)
Albumin: 2.7 g/dL — ABNORMAL LOW (ref 3.5–5.0)
Alkaline Phosphatase: 78 U/L (ref 38–126)
Anion gap: 8 (ref 5–15)
BUN: 9 mg/dL (ref 6–20)
CO2: 27 mmol/L (ref 22–32)
Calcium: 8 mg/dL — ABNORMAL LOW (ref 8.9–10.3)
Chloride: 103 mmol/L (ref 98–111)
Creatinine, Ser: 0.65 mg/dL (ref 0.44–1.00)
GFR, Estimated: >60 mL/min (ref >60)
Glucose, Bld: 112 mg/dL — ABNORMAL HIGH (ref 70–99)
Potassium: 3.4 mmol/L — ABNORMAL LOW (ref 3.5–5.1)
Sodium: 138 mmol/L (ref 135–145)
Total Bilirubin: 0.9 mg/dL (ref 0.3–1.2)
Total Protein: 5.7 g/dL — ABNORMAL LOW (ref 6.5–8.1)

## 2023-01-30 NOTE — Progress Notes (Signed)
Progress Note: General Surgery Service   Chief Complaint/Subjective: Pain improved some from before surgery.  Itchy.  Sleeping a lot after meds  Objective: Vital signs in last 24 hours: Temp:  [97.8 F (36.6 C)-98.7 F (37.1 C)] 98 F (36.7 C) (03/12 0841) Pulse Rate:  [70-98] 88 (03/12 0841) Resp:  [16] 16 (03/12 0841) BP: (117-137)/(62-88) 117/62 (03/12 0841) SpO2:  [92 %-96 %] 93 % (03/12 0841) Last BM Date : 01/24/23  Intake/Output from previous day: 03/11 0701 - 03/12 0700 In: 3603.9 [P.O.:720; I.V.:2722.9; IV Piggyback:161] Out: [id number] [Urine:600; Drains:1150; Blood:5] Intake/Output this shift: Total I/O In: 546.7 [I.V.:446.7; IV Piggyback:100] Out: 650 [Urine:400; Drains:250]  Constitutional: NAD; conversant; no deformities Eyes: Moist conjunctiva; no lid lag; anicteric; PERRL Neck: Trachea midline; no thyromegaly Lungs: Normal respiratory effort; no tactile fremitus CV: RRR; no palpable thrills; no pitting edema GI: All three drains with significant amount of bilious drainage MSK: Normal range of motion of extremities; no clubbing/cyanosis Psychiatric: Appropriate affect; alert and oriented x3 Lymphatic: No palpable cervical or axillary lymphadenopathy  Lab Results: CBC  Recent Labs    01/27/23 1800 01/28/23 0522  WBC 11.0* 10.5  HGB 13.3 12.3  HCT 41.6 38.9  PLT 264 320    BMET Recent Labs    01/27/23 1157 01/27/23 1800 01/28/23 0522  NA 136  --  138  K 3.7  --  3.9  CL 99  --  102  CO2 27  --  24  GLUCOSE 148*  --  134*  BUN 9  --  10  CREATININE 0.66 0.71 0.61  CALCIUM 9.0  --  8.6*    PT/INR Recent Labs    01/28/23 1411  LABPROT 15.9*  INR 1.3*    ABG No results for input(s): "PHART", "HCO3" in the last 72 hours.  Invalid input(s): "PCO2", "PO2"  Anti-infectives: Anti-infectives (From admission, onward)    Start     Dose/Rate Route Frequency Ordered Stop   01/29/23 1115  valACYclovir (VALTREX) tablet 500 mg        500 mg Oral  Daily 01/29/23 1025     01/27/23 2200  piperacillin-tazobactam (ZOSYN) IVPB 3.375 g        3.375 g 12.5 mL/hr over 240 Minutes Intravenous Every 8 hours 01/27/23 1822     01/27/23 1330  piperacillin-tazobactam (ZOSYN) IVPB 3.375 g        3.375 g 12.5 mL/hr over 240 Minutes Intravenous  Once 01/27/23 1321 01/27/23 1715       Medications: Scheduled Meds:  enoxaparin (LOVENOX) injection  40 mg Subcutaneous Q24H   metoCLOPramide (REGLAN) injection  10 mg Intravenous Q8H   polyethylene glycol  17 g Oral BID   valACYclovir  500 mg Oral Daily   Continuous Infusions:  dextrose 5 % and 0.9% NaCl 100 mL/hr at 01/30/23 0819   piperacillin-tazobactam (ZOSYN)  IV 3.375 g (01/30/23 0542)   PRN Meds:.fluticasone, HYDROcodone-acetaminophen, HYDROmorphone (DILAUDID) injection, ondansetron **OR** ondansetron (ZOFRAN) IV, prochlorperazine  Assessment/Plan: Ms. Ganoe underwent laparoscopic cholecystectomy with intraoperative cholangiogram on 01/25/23.  She was readmitted over the weekend with a bile leak, unable to be drained by IR.  She then underwent diagnostic laparoscopy with abdominal washout and three JP drains placed.  These drains traversed a fluid collection between the internal and external oblique on the right abdomen which may be a hematoma or biloma but will hopefully drain along the drain track as well.  Doing well after washout.  Still a lot of irrigation making its way  out of the drain. Sipping on liquids, advance as tolerated Pain control    LOS: 3 days    Felicie Morn, [facility] Use [email or web address] to contact on call provider  Daily Billing: [phone] - post op

## 2023-01-30 NOTE — Progress Notes (Signed)
Pharmacy Antibiotic Note  Monica [name] is a 47 y.o. female admitted on 01/27/2023 with  intra-abdominal infection .  Pharmacy has been consulted for Zosyn dosing.  D4 Zosyn Labs and vitals stable  Plan: Continue Zosyn 3.375g IV Q8H infused over 4hrs. Follow up renal function, culture results, and clinical course. Will sign off    Temp (24hrs), Avg:98.2 F (36.8 C), Min:97.8 F (36.6 C), Max:98.9 F (37.2 C)  Recent Labs  Lab 01/24/23 0848 01/27/23 1157 01/27/23 1312 01/27/23 1800 01/28/23 0522  WBC 8.3 16.0*  --  11.0* 10.5  CREATININE  --  0.66  --  0.71 0.61  LATICACIDVEN  --  2.2* 2.0*  --   --      Estimated Creatinine Clearance: 105.5 mL/min (by C-G formula based on SCr of 0.61 mg/dL).    No Known Allergies  Antimicrobials this admission: 3/9 Zosyn >>   Dose adjustments this admission:   Microbiology results: 3/9 BCx: ngtd  Thank you for allowing pharmacy to be a part of this patient's care.  Adrian Saran, PharmD, BCPS Secure Chat if ?s 01/30/2023 8:41 AM

## 2023-01-30 NOTE — Progress Notes (Signed)
[facility] Gastroenterology Progress Note  Monica [name] 47 y.o. 1976/10/05  CC: Bile leak.   Subjective: Patient seen and examined at bedside.  Abdominal pain has improved significantly.  She is concern for jaundice.  ROS : Afebrile, negative for chest pain.   Objective: Vital signs in last 24 hours: Vitals:   01/30/23 0841 01/30/23 1139  BP: 117/62 (!) 136/91  Pulse: 88 91  Resp: 16   Temp: 98 F (36.7 C) 97.8 F (36.6 C)  SpO2: 93% 95%    Physical Exam:  General -alert but somewhat lethargic. No visible respiratory distress Abdomen  -generalized discomfort on palpation.  Abdomen is soft, no peritoneal signs.  Multiple drains in place.  Lab Results: Recent Labs    01/27/23 1157 01/27/23 1800 01/28/23 0522  NA 136  --  138  K 3.7  --  3.9  CL 99  --  102  CO2 27  --  24  GLUCOSE 148*  --  134*  BUN 9  --  10  CREATININE 0.66 0.71 0.61  CALCIUM 9.0  --  8.6*   Recent Labs    01/27/23 1157 01/28/23 0522  AST 37 27  ALT 45* 36  ALKPHOS 88 74  BILITOT 1.3* 1.5*  PROT 7.9 6.4*  ALBUMIN 4.4 3.3*   Recent Labs    01/27/23 1157 01/27/23 1800 01/28/23 0522  WBC 16.0* 11.0* 10.5  NEUTROABS 13.5*  --   --   HGB 14.8 13.3 12.3  HCT 45.9 41.6 38.9  MCV 85.8 86.0 87.4  PLT 438* 264 320   Recent Labs    01/28/23 1411  LABPROT 15.9*  INR 1.3*      Assessment/Plan: -Bile leak possible bile peritonitis.  Unable to do IR guided drainage.  S/p diagnostic laparoscopy with washout and drain placement 01/29/2023  -History of Roux-en-Y gastric bypass -Constipation  Recommendations ------------------------- -Patient is concern for yellowish skin discoloration.  I do not see any significant scleral icterus, only minimal discoloration noted.  Check CMP today. -Diet advancement per surgical team. -GI will follow   Otis Brace MD, [place] 01/30/2023, 12:06 PM  Contact #  [phone]

## 2023-01-31 LAB — CBC
HCT: 31 % — ABNORMAL LOW (ref 36.0–46.0)
Hemoglobin: 9.8 g/dL — ABNORMAL LOW (ref 12.0–15.0)
MCH: 27.3 pg (ref 26.0–34.0)
MCHC: 31.6 g/dL (ref 30.0–36.0)
MCV: 86.4 fL (ref 80.0–100.0)
Platelets: 360 10*3/uL (ref 150–400)
RBC: 3.59 MIL/uL — ABNORMAL LOW (ref 3.87–5.11)
RDW: 14.4 % (ref 11.5–15.5)
WBC: 6.4 10*3/uL (ref 4.0–10.5)
nRBC: 0 % (ref 0.0–0.2)

## 2023-01-31 LAB — HEPATIC FUNCTION PANEL
ALT: 16 U/L (ref 0–44)
AST: 9 U/L — ABNORMAL LOW (ref 15–41)
Albumin: 2.9 g/dL — ABNORMAL LOW (ref 3.5–5.0)
Alkaline Phosphatase: 74 U/L (ref 38–126)
Bilirubin, Direct: 0.3 mg/dL — ABNORMAL HIGH (ref 0.0–0.2)
Indirect Bilirubin: 0.4 mg/dL (ref 0.3–0.9)
Total Bilirubin: 0.7 mg/dL (ref 0.3–1.2)
Total Protein: 5.7 g/dL — ABNORMAL LOW (ref 6.5–8.1)

## 2023-01-31 LAB — BASIC METABOLIC PANEL
Anion gap: 5 (ref 5–15)
BUN: 7 mg/dL (ref 6–20)
CO2: 26 mmol/L (ref 22–32)
Calcium: 8 mg/dL — ABNORMAL LOW (ref 8.9–10.3)
Chloride: 104 mmol/L (ref 98–111)
Creatinine, Ser: 0.68 mg/dL (ref 0.44–1.00)
GFR, Estimated: >60 mL/min (ref >60)
Glucose, Bld: 112 mg/dL — ABNORMAL HIGH (ref 70–99)
Potassium: 3.4 mmol/L — ABNORMAL LOW (ref 3.5–5.1)
Sodium: 135 mmol/L (ref 135–145)

## 2023-01-31 MED ORDER — ENSURE MAX PROTEIN PO LIQD
11.0000 [oz_av] | Freq: Three times a day (TID) | ORAL | Status: DC
  Administered 2023-01-31 – 2023-02-01 (×2): 11 [oz_av] via ORAL

## 2023-01-31 NOTE — Progress Notes (Signed)
Assisted pt with shower today and redressed JP drains.  During this time, also numbered the the physical JP drains to coincide with how they are listed in EPIC.  If there is some difference in output, there may have been some confusion between right sided drains #1 and #2.  Will document output moving forward in accordance with documented drains in EPIC.  Right sided drain #2 is having the most output on the right side. Will continue to monitor as needed.

## 2023-01-31 NOTE — Progress Notes (Signed)
[facility] Gastroenterology Progress Note  Monica [name] 47 y.o. [birth year]  CC: Bile leak.   Subjective: Patient seen and examined at bedside.  Complaining of ongoing abdominal pain requiring around-the-clock pain medication.  Passing gas.  No bowel movement since surgery.  Complaining of nausea.  No vomiting.  ROS : Afebrile, negative for chest pain.   Objective: Vital signs in last 24 hours: Vitals:   01/30/23 2112 01/31/23 0633  BP: 115/71 117/74  Pulse: 93 88  Resp: 18 18  Temp: 98.4 F (36.9 C) 98.7 F (37.1 C)  SpO2: 96% 92%    Physical Exam:  General -alert but somewhat lethargic. No visible respiratory distress Abdomen  -generalized discomfort on palpation.  Abdomen is soft, no peritoneal signs.  Multiple drains in place.  Lab Results: Recent Labs    01/30/23 1443 01/31/23 0444  NA 138 135  K 3.4* 3.4*  CL 103 104  CO2 27 26  GLUCOSE 112* 112*  BUN 9 7  CREATININE 0.65 0.68  CALCIUM 8.0* 8.0*   Recent Labs    01/30/23 1443 01/31/23 0444  AST 12* 9*  ALT 19 16  ALKPHOS 78 74  BILITOT 0.9 0.7  PROT 5.7* 5.7*  ALBUMIN 2.7* 2.9*   Recent Labs    01/31/23 0444  WBC 6.4  HGB 9.8*  HCT 31.0*  MCV 86.4  PLT 360   Recent Labs    01/28/23 1411  LABPROT 15.9*  INR 1.3*      Assessment/Plan: -Bile leak possible bile peritonitis.  Unable to do IR guided drainage.  S/p diagnostic laparoscopy with washout and drain placement 01/29/2023  -History of Roux-en-Y gastric bypass -Constipation  Recommendations ------------------------- -Patient with ongoing increased drain output with around total 1750 over last 24 hours.  Also continues to have abdominal pain requiring around-the-clock pain medications.  Her vitals are stable.  Mild drop in hemoglobin noted today to 9.8.  No leukocytosis.  Normal LFTs. -I will discuss case with Dr. Rush Landmark to see if she will benefit from ERCP with CBD stent placement. -Continue other supportive care per  surgical team.   Otis Brace MD, FACP 01/31/2023, 9:43 AM  Contact #  [phone]

## 2023-01-31 NOTE — Progress Notes (Signed)
Progress Note: General Surgery Service   Chief Complaint/Subjective: Pain after drain manipulated.  Very high drain output  Objective: Vital signs in last 24 hours: Temp:  [97.8 F (36.6 C)-98.7 F (37.1 C)] 98.7 F (37.1 C) (03/13 0633) Pulse Rate:  [88-93] 88 (03/13 0633) Resp:  [18] 18 (03/13 [id number]) BP: (115-136)/(71-91) 117/74 (03/13 [id number]) SpO2:  [92 %-96 %] 92 % (03/13 [id number]) Last BM Date : 01/24/23  Intake/Output from previous day: 03/12 0701 - 03/13 0700 In: 2919.6 [P.O.:540; I.V.:2228.6; IV Piggyback:151] Out: 2850 [Urine:1100; Drains:1750] Intake/Output this shift: Total I/O In: 0  Out: 55 [Drains:55]  Constitutional: NAD; conversant; no deformities Eyes: Moist conjunctiva; no lid lag; anicteric; PERRL Neck: Trachea midline; no thyromegaly Lungs: Normal respiratory effort; no tactile fremitus CV: RRR; no palpable thrills; no pitting edema GI: All three drains with significant amount of bilious drainage MSK: Normal range of motion of extremities; no clubbing/cyanosis Psychiatric: Appropriate affect; alert and oriented x3 Lymphatic: No palpable cervical or axillary lymphadenopathy  Lab Results: CBC  Recent Labs    01/31/23 0444  WBC 6.4  HGB 9.8*  HCT 31.0*  PLT 360    BMET Recent Labs    01/30/23 1443 01/31/23 0444  NA 138 135  K 3.4* 3.4*  CL 103 104  CO2 27 26  GLUCOSE 112* 112*  BUN 9 7  CREATININE 0.65 0.68  CALCIUM 8.0* 8.0*    PT/INR Recent Labs    01/28/23 1411  LABPROT 15.9*  INR 1.3*    ABG No results for input(s): "PHART", "HCO3" in the last 72 hours.  Invalid input(s): "PCO2", "PO2"  Anti-infectives: Anti-infectives (From admission, onward)    Start     Dose/Rate Route Frequency Ordered Stop   01/29/23 1115  valACYclovir (VALTREX) tablet 500 mg        500 mg Oral Daily 01/29/23 1025     01/27/23 2200  piperacillin-tazobactam (ZOSYN) IVPB 3.375 g        3.375 g 12.5 mL/hr over 240 Minutes Intravenous Every 8 hours  01/27/23 1822     01/27/23 1330  piperacillin-tazobactam (ZOSYN) IVPB 3.375 g        3.375 g 12.5 mL/hr over 240 Minutes Intravenous  Once 01/27/23 1321 01/27/23 1715       Medications: Scheduled Meds:  enoxaparin (LOVENOX) injection  40 mg Subcutaneous Q24H   metoCLOPramide (REGLAN) injection  10 mg Intravenous Q8H   polyethylene glycol  17 g Oral BID   Ensure Max Protein  11 oz Oral TID   valACYclovir  500 mg Oral Daily   Continuous Infusions:  dextrose 5 % and 0.9% NaCl 100 mL/hr at 01/31/23 0316   piperacillin-tazobactam (ZOSYN)  IV 3.375 g (01/31/23 0523)   PRN Meds:.fluticasone, HYDROcodone-acetaminophen, HYDROmorphone (DILAUDID) injection, ondansetron **OR** ondansetron (ZOFRAN) IV, prochlorperazine  Assessment/Plan: Ms. Brender underwent laparoscopic cholecystectomy with intraoperative cholangiogram on 01/25/23.  She was readmitted over the weekend with a bile leak, unable to be drained by IR.  She then underwent diagnostic laparoscopy with abdominal washout and three JP drains placed.  These drains traversed a fluid collection between the internal and external oblique on the right abdomen which may be a hematoma or biloma but will hopefully drain along the drain track as well.  Drainage not slowing down Will discuss next steps with GI and IR Protein shakes, full liquids today - nutrition important Pain control, frequent dressing changes, frequent JP emptying Pain control    LOS: 4 days    Felicie Morn,  MD  [facility] Use [email or web address] to contact on call provider  Daily Billing: [phone] - post op

## 2023-02-01 ENCOUNTER — Other Ambulatory Visit: Payer: Self-pay

## 2023-02-01 DIAGNOSIS — E43 Unspecified severe protein-calorie malnutrition: Secondary | ICD-10-CM | POA: Insufficient documentation

## 2023-02-01 LAB — CULTURE, BLOOD (ROUTINE X 2)
Culture: NO GROWTH
Special Requests: ADEQUATE

## 2023-02-01 LAB — BASIC METABOLIC PANEL
Anion gap: 8 (ref 5–15)
BUN: 5 mg/dL — ABNORMAL LOW (ref 6–20)
CO2: 28 mmol/L (ref 22–32)
Calcium: 8.3 mg/dL — ABNORMAL LOW (ref 8.9–10.3)
Chloride: 102 mmol/L (ref 98–111)
Creatinine, Ser: 0.6 mg/dL (ref 0.44–1.00)
GFR, Estimated: >60 mL/min (ref >60)
Glucose, Bld: 115 mg/dL — ABNORMAL HIGH (ref 70–99)
Potassium: 3.5 mmol/L (ref 3.5–5.1)
Sodium: 138 mmol/L (ref 135–145)

## 2023-02-01 LAB — PHOSPHORUS: Phosphorus: 5 mg/dL — ABNORMAL HIGH (ref 2.5–4.6)

## 2023-02-01 LAB — MAGNESIUM: Magnesium: 1.7 mg/dL (ref 1.7–2.4)

## 2023-02-01 MED ORDER — SODIUM CHLORIDE 0.9 % IV SOLN: INTRAVENOUS | Status: AC

## 2023-02-01 MED ORDER — INSULIN ASPART 100 UNIT/ML IJ SOLN
0.0000 [IU] | Freq: Four times a day (QID) | INTRAMUSCULAR | Status: DC
  Administered 2023-02-03 – 2023-02-05 (×9): 1 [IU] via SUBCUTANEOUS

## 2023-02-01 MED ORDER — POTASSIUM CHLORIDE 10 MEQ/100ML IV SOLN
10.0000 meq | INTRAVENOUS | Status: AC
  Administered 2023-02-01 (×2): 10 meq via INTRAVENOUS

## 2023-02-01 MED ORDER — BISACODYL 10 MG RE SUPP
10.0000 mg | Freq: Once | RECTAL | Status: AC
  Administered 2023-02-01: 10 mg via RECTAL
  Filled 2023-02-01: qty 1

## 2023-02-01 MED ORDER — SODIUM CHLORIDE 0.9% FLUSH
10.0000 mL | Freq: Two times a day (BID) | INTRAVENOUS | Status: DC
  Administered 2023-02-02 – 2023-02-06 (×2): 10 mL

## 2023-02-01 MED ORDER — SODIUM CHLORIDE 0.9% FLUSH: 10.0000 mL | INTRAVENOUS | Status: DC | PRN

## 2023-02-01 MED ORDER — TRAVASOL 10 % IV SOLN
INTRAVENOUS | Status: DC
  Filled 2023-02-01: qty 352.8

## 2023-02-01 MED ORDER — TRAVASOL 10 % IV SOLN: INTRAVENOUS | Status: DC

## 2023-02-01 MED ORDER — ENSURE MAX PROTEIN PO LIQD
11.0000 [oz_av] | Freq: Three times a day (TID) | ORAL | Status: DC
  Administered 2023-02-02: 11 mL via ORAL
  Administered 2023-02-03 – 2023-02-04 (×4): 11 [oz_av] via ORAL

## 2023-02-01 MED ORDER — CHLORHEXIDINE GLUCONATE CLOTH 2 % EX PADS
6.0000 | MEDICATED_PAD | Freq: Every day | CUTANEOUS | Status: DC
  Administered 2023-02-01 – 2023-02-07 (×7): 6 via TOPICAL

## 2023-02-01 MED ORDER — METHOCARBAMOL 1000 MG/10ML IJ SOLN
500.0000 mg | Freq: Four times a day (QID) | INTRAVENOUS | Status: DC | PRN
  Administered 2023-02-01 – 2023-02-02 (×2): 500 mg via INTRAVENOUS
  Filled 2023-02-01 (×2): qty 500

## 2023-02-01 MED ORDER — MAGNESIUM SULFATE 2 GM/50ML IV SOLN
2.0000 g | Freq: Once | INTRAVENOUS | Status: AC
  Administered 2023-02-01: 2 g via INTRAVENOUS
  Filled 2023-02-01: qty 50

## 2023-02-01 MED ORDER — POTASSIUM CHLORIDE 10 MEQ/100ML IV SOLN
10.0000 meq | INTRAVENOUS | Status: AC
  Administered 2023-02-01: 10 meq via INTRAVENOUS
  Filled 2023-02-01 (×3): qty 100

## 2023-02-01 MED ORDER — DEXTROSE 10 % IV SOLN: INTRAVENOUS | Status: AC

## 2023-02-01 NOTE — Progress Notes (Signed)
[facility] Gastroenterology Progress Note  Monica [name] 47 y.o. 04/22/1976  CC: Bile leak.   Subjective: Patient seen and examined at bedside.  Drain output has decreased over last 24 hours.  She continues to deal with the constipation.  Continues to have abdominal pain.  ROS : Afebrile, negative for chest pain.   Objective: Vital signs in last 24 hours: Vitals:   02/01/23 0608 02/01/23 0938  BP: 130/82 (!) 128/92  Pulse: 78 73  Resp: (!) 8 17  Temp: 98.6 F (37 C) 97.9 F (36.6 C)  SpO2: 92% 98%    Physical Exam:  General -alert but somewhat lethargic. No visible respiratory distress Abdomen  -generalized discomfort on palpation.  Abdomen is soft, no peritoneal signs.  Multiple drains in place.  Lab Results: Recent Labs    01/30/23 1443 01/31/23 0444  NA 138 135  K 3.4* 3.4*  CL 103 104  CO2 27 26  GLUCOSE 112* 112*  BUN 9 7  CREATININE 0.65 0.68  CALCIUM 8.0* 8.0*   Recent Labs    01/30/23 1443 01/31/23 0444  AST 12* 9*  ALT 19 16  ALKPHOS 78 74  BILITOT 0.9 0.7  PROT 5.7* 5.7*  ALBUMIN 2.7* 2.9*   Recent Labs    01/31/23 0444  WBC 6.4  HGB 9.8*  HCT 31.0*  MCV 86.4  PLT 360   No results for input(s): "LABPROT", "INR" in the last 72 hours.     Assessment/Plan: -Bile leak possible bile peritonitis.  Unable to do IR guided drainage.  S/p diagnostic laparoscopy with washout and drain placement 01/29/2023  -History of Roux-en-Y gastric bypass -Constipation -likely from narcotic pain medication.  Recommendations ------------------------- -Drain output has decreased significantly over last 24 hours.  Only around 1000 cc drain in last 24 hours compared to 1750 during previous 24 hours. -Case discussed with Dr. Rush Landmark.  He recommended transfer to tertiary care center such as [facility] if ERCP is clinically indicated because of complexity of the case given her history of gastric bypass. -Hopefully her drain output will continue to  improve over the next few days. -Patient is already on MiraLAX twice a day.  Add Dulcolax suppositories.  If no improvement in constipation, consider Fleet enema tomorrow. -GI will follow  Otis Brace MD, [place] 02/01/2023, 11:27 AM  Contact #  [phone]

## 2023-02-01 NOTE — Progress Notes (Signed)
Peripherally Inserted Central Catheter Placement  The IV Nurse has discussed with the patient and/or persons authorized to consent for the patient, the purpose of this procedure and the potential benefits and risks involved with this procedure.  The benefits include less needle sticks, lab draws from the catheter, and the patient may be discharged home with the catheter. Risks include, but not limited to, infection, bleeding, blood clot (thrombus formation), and puncture of an artery; nerve damage and irregular heartbeat and possibility to perform a PICC exchange if needed/ordered by physician.  Alternatives to this procedure were also discussed.  Bard Power PICC patient education guide, fact sheet on infection prevention and patient information card has been provided to patient /or left at bedside.    PICC Placement Documentation  PICC Double Lumen [id number] Right Basilic 41 cm 1 cm (Active)  Indication for Insertion or Continuance of Line Administration of hyperosmolar/irritating solutions (i.e. TPN, Vancomycin, etc.) 02/01/23 1854  Exposed Catheter (cm) 1 cm 02/01/23 1854  Site Assessment Clean, Dry, Intact 02/01/23 1854  Lumen #1 Status Flushed;Blood return noted;Saline locked 02/01/23 1854  Lumen #2 Status Flushed;Blood return noted;Saline locked 02/01/23 1854  Dressing Type Transparent 02/01/23 1854  Dressing Status Antimicrobial disc in place 02/01/23 [place] Not Applicable [id number]  Line Care Connections checked and tightened 02/01/23 1854  Line Adjustment (NICU/IV Team Only) No 02/01/23 1854  Dressing Intervention New dressing 02/01/23 1854  Dressing Change Due 02/08/23 02/01/23 1854       Scotty Court 02/01/2023, 6:56 PM

## 2023-02-01 NOTE — Progress Notes (Addendum)
Progress Note: General Surgery Service   Chief Complaint/Subjective: Drains slowing significantly last 24 hours.  Still significant amount of drainage from all three drains  Objective: Vital signs in last 24 hours: Temp:  [98.5 F (36.9 C)-98.6 F (37 C)] 98.6 F (37 C) (03/14 [id number]) Pulse Rate:  [78-104] 78 (03/14 0608) Resp:  [8-18] 8 (03/14 0608) BP: (113-135)/(70-82) 130/82 (03/14 0608) SpO2:  [92 %-99 %] 92 % (03/14 0608) Last BM Date : 01/24/23  Intake/Output from previous day: 03/13 0701 - 03/14 0700 In: 2431 [P.O.:600; I.V.:1655.9; IV Piggyback:150.1] Out: 2605 [Urine:1600; Drains:1005] Intake/Output this shift: No intake/output data recorded.  Constitutional: NAD; conversant; no deformities Eyes: Moist conjunctiva; no lid lag; anicteric; PERRL Neck: Trachea midline; no thyromegaly Lungs: Normal respiratory effort; no tactile fremitus CV: RRR; no palpable thrills; no pitting edema GI: All three drains with significant amount of bilious drainage MSK: Normal range of motion of extremities; no clubbing/cyanosis Psychiatric: Appropriate affect; alert and oriented x3 Lymphatic: No palpable cervical or axillary lymphadenopathy  Lab Results: CBC  Recent Labs    01/31/23 0444  WBC 6.4  HGB 9.8*  HCT 31.0*  PLT 360   BMET Recent Labs    01/30/23 1443 01/31/23 0444  NA 138 135  K 3.4* 3.4*  CL 103 104  CO2 27 26  GLUCOSE 112* 112*  BUN 9 7  CREATININE 0.65 0.68  CALCIUM 8.0* 8.0*   PT/INR No results for input(s): "LABPROT", "INR" in the last 72 hours. ABG No results for input(s): "PHART", "HCO3" in the last 72 hours.  Invalid input(s): "PCO2", "PO2"  Anti-infectives: Anti-infectives (From admission, onward)    Start     Dose/Rate Route Frequency Ordered Stop   01/29/23 1115  valACYclovir (VALTREX) tablet 500 mg        500 mg Oral Daily 01/29/23 1025     01/27/23 2200  piperacillin-tazobactam (ZOSYN) IVPB 3.375 g        3.375 g 12.5 mL/hr over 240  Minutes Intravenous Every 8 hours 01/27/23 1822     01/27/23 1330  piperacillin-tazobactam (ZOSYN) IVPB 3.375 g        3.375 g 12.5 mL/hr over 240 Minutes Intravenous  Once 01/27/23 1321 01/27/23 1715       Medications: Scheduled Meds:  enoxaparin (LOVENOX) injection  40 mg Subcutaneous Q24H   metoCLOPramide (REGLAN) injection  10 mg Intravenous Q8H   polyethylene glycol  17 g Oral BID   Ensure Max Protein  11 oz Oral TID   valACYclovir  500 mg Oral Daily   Continuous Infusions:  dextrose 5 % and 0.9% NaCl 100 mL/hr at 01/31/23 1842   piperacillin-tazobactam (ZOSYN)  IV 3.375 g (02/01/23 0609)   PRN Meds:.fluticasone, HYDROcodone-acetaminophen, HYDROmorphone (DILAUDID) injection, ondansetron **OR** ondansetron (ZOFRAN) IV, prochlorperazine  Assessment/Plan: Monica [name] underwent laparoscopic cholecystectomy with intraoperative cholangiogram on 01/25/23.  She was readmitted over the weekend with a bile leak, unable to be drained by IR.  She then underwent diagnostic laparoscopy with abdominal washout and three JP drains placed.  These drains traversed a fluid collection between the internal and external oblique on the right abdomen which may be a hematoma or biloma but will hopefully drain along the drain track as well.  Drainage slowed down some today Will work to make tentative plans with GI next week for trans gastric ERCP, but watch drainage over the weekend Will discuss with GI Protein shakes, full liquids today - nutrition important  Not taking much in yesterday, will start TPN  Pain control, frequent dressing changes, frequent JP emptying     LOS: 5 days    Monica Morn, MD  [facility] Use [email or web address] to contact on call provider  Daily Billing: [phone] - post op

## 2023-02-01 NOTE — Progress Notes (Signed)
PHARMACY - TOTAL PARENTERAL NUTRITION CONSULT NOTE   Indication: Prolonged ileus  Patient Measurements: Actual body weight: 90.3 kg Height: 5'8"  Assessment: Pt is a 16 yoF who is s/p Roux-en-Y and appendectomy in September of 2023. Pt underwent elective lap chole on 3/7 then presented to the ED on 3/9 with abdominal pain. Pt found to have post-op biloma/bile leak and possible bile peritonitis. Pt underwent urgent laparoscopic diagnostic with washout and drain placement on 3/11. Pharmacy consulted to manage TPN on 3/14.   Glucose / Insulin: No history of DM. CBG goal: 100-180. Electrolytes: Phos (5) elevated. Mg (1.7), K (3.5) on lower end of normal. All other lytes WNL including CorrCa (9.2) Renal: SCr WNL, BUN low Hepatic: LFTs, T.bili, Alk Phos all WNL Intake / Output; MIVF: Strict I/O not recorded.  -UOP: 1600 mL + 4 unmeasured. Drain: 1005 mL, no stool -D5 NS @ 100 mL/hr GI Imaging: -HIDA: Findings compatible with bile leak GI Surgeries / Procedures:  -08/07/22: Roux-en-Y, appendectomy -01/25/23: Lap chole with intraoperative cholangiogram -02/06/23: CT guided drain of biloma by IR (2-3 mL dark thin amber fluid) -01/29/23: Laparascopic diagnostic with washout and drain placement (3 JP drains)  Central access: PICC ordered 3/14 TPN start date: 3/14 pending placement of PICC  Nutritional Goals: Goal TPN rate is 80 mL/hr (provides 94 g of protein and 1705 kcals per day)  RD Assessment:    Current Nutrition:  Bariatric full liquid TPN Supplements: Ensure Max TID  Plan:  Now:  Mg 2 g IV once KCl 10 mEq/100 mL x 3 doses  At 1800: Start TPN at 30 mL/hr at 1800 Will provide 35 g protein, 640 kcal, and 94 g dextrose Electrolytes in TPN: Standard, except remove Phos  Na 50 mEq/L, K 50 mEq/L, Ca 5 mEq/L, Mg 5 mEq/L, and Phos 0 mmol/L.  Cl:Ac 1:1 Add standard MVI and trace elements to TPN Thiamine in TPN Initiate Sensitive q6h SSI and adjust as needed  Reduce MIVF to 70  mL/hr. Change to NS. Monitor TPN labs on Mon/Thurs, recheck electrolytes with AM labs tomorrow.   Lenis Noon, PharmD 02/01/2023,8:50 AM

## 2023-02-01 NOTE — Plan of Care (Signed)
7a-7p Education and support provided throughout this shift and patient elects PICC line placement. Line placed by PICC team. Also this shift, patient goes outside with her family and sits in garden area. Enjoys time with her three young children. Coordination with MD, IV team, and pharmacy to adjust medication times due to lack of access and time off unit. Ivan Anchors, RN 02/01/23 7:14 PM  Problem: Education: Goal: Knowledge of General Education information will improve Description: Including pain rating scale, medication(s)/side effects and non-pharmacologic comfort measures Outcome: Progressing   Problem: Clinical Measurements: Goal: Ability to maintain clinical measurements within normal limits will improve Outcome: Progressing   Problem: Activity: Goal: Risk for activity intolerance will decrease Outcome: Progressing   Problem: Nutrition: Goal: Adequate nutrition will be maintained Outcome: [place], RN 02/01/23 7:14 PM

## 2023-02-01 NOTE — Progress Notes (Addendum)
Initial Nutrition Assessment  DOCUMENTATION CODES:   Severe malnutrition in context of acute illness/injury  INTERVENTION:  Discussed need for adequate nutrition  Educated patient on protein/kcal supplementation  Educated patient on TPN  Discussed plans and recommendations with RPH and surgeon  Continue Ensure Max po TID, each supplement provides 150 kcal and 30 grams of protein.   NUTRITION DIAGNOSIS:   Severe Malnutrition related to poor appetite, acute illness, nausea, altered GI function, post-op healing as evidenced by energy intake < or equal to 50% for > or equal to 5 days, moderate fat depletion, moderate muscle depletion.   GOAL:   Patient will meet greater than or equal to 90% of their needs  MONITOR:   PO intake, Labs, Supplement acceptance, Weight trends, Diet advancement  REASON FOR ASSESSMENT:   Consult New TPN/TNA  ASSESSMENT:  47 y.o. 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 Patient was nauseous and dry heaving PTA. She is s/p cholecystectomy POD 7. Patient reports a surgical complication where her liver was "knicked" and led to a bile leak. Patient is s/p washout and drain placement. Her drain output has been steadily decreasing.   She has been on a bariatric clear liquid diet since admission with very poor po intake and was upgraded to full liquids yesterday. Patient states she has only been eating the broth and drinking Ensure surgery. She reports eating a greek yogurt for dinner last night. RD observed a premier protein shake at her bedside which her husband provided from home. She states she cannot tolerate Ensure Enlive-- it is too sweet and makes her nauseous.   Patient reports a good eating regimen since gastric bypass surgery until cholecystitis set in. She ate 5-6  small meals per day which included but are not limited to nuts, seeds,  low fat cottage cheese, vegetables, and protein shakes. Patient states she prioritizes her protein, takes all her vitamins and minerals and avoids food that does not agree with her. Patient has expressed concern for potential micronutrient deficiency since she has not been able to take her supplements in the past week.  Patient reports severe constipation. She reports her LBM on 3/6. Patient states fear for drinking mag citrate at bedside and is more open to enema or other form stool softener/laxative.   Patient was tearful while discussing nutrition support and expressed apprehension about moving forward with TPN. Patient would be appropriate for a small bore feeding tube to the limb if clinically appropriate and if patient is agreeable in the future. Per physician, patient to potentially undergo ERCP and feeding tube placement if nutrition support needs to continue for long term.   TPN recs shared with RPH and hopefully, TPN can start today after PICC is in place.   Labs: K+ 3.4, Glu 112 Meds: reglan, zosyn, miralax, NS   Wt: 40.3 kg (31%) wt loss x 8 months mostly 2/2 gastric bypass surgery on 07/2022    Wt Readings from Last 10 Encounters:  02/01/23 89 kg  01/25/23 90.3 kg  01/24/23 90.3 kg  10/03/22 107.3 kg  08/22/22 116.9 kg  08/07/22 122.3 kg  07/31/22 123.4 kg  07/06/22 129.2 kg  07/03/22 129.2 kg  05/31/22 129.3 kg   Po intake: minimal po intake since admission  I/O's: +2.5L    NUTRITION - FOCUSED PHYSICAL EXAM:  Flowsheet Row  Most Recent Value  Orbital Region Moderate depletion  Upper Arm Region No depletion  Thoracic and Lumbar Region Unable to assess  [patient experiencing abdominal pain]  Buccal Region Mild depletion  Temple Region Moderate depletion  Clavicle Bone Region Mild depletion  Clavicle and Acromion Bone Region Mild depletion  Scapular Bone Region Unable to assess  [patient unable to  lean forward due to pain]  Dorsal Hand Moderate depletion  Patellar Region No depletion  Anterior Thigh Region No depletion  Posterior Calf Region No depletion  Edema (RD Assessment) None  Hair Reviewed  Eyes Reviewed  Mouth Reviewed  Skin Reviewed  Nails Reviewed       Diet Order:   Diet Order             Diet bariatric full liquid Room service appropriate? Yes; Fluid consistency: Thin  Diet effective now                   EDUCATION NEEDS:   Education needs have been addressed  Skin:  Skin Assessment: Reviewed RN Assessment  Last BM:  3/6  Height:   Ht Readings from Last 1 Encounters:  02/01/23 '5\' 8"'$  (1.727 m)    Weight:   Wt Readings from Last 1 Encounters:  02/01/23 89 kg    BMI:  Body mass index is 29.83 kg/m.  Estimated Nutritional Needs:   Kcal:  1600-1800 kcal/day  Protein:  95-105 g protein/day  Fluid:  >/= 2L   Trey Paula, RDN, LDN  Clinical Nutrition

## 2023-02-02 LAB — COMPREHENSIVE METABOLIC PANEL
ALT: 31 U/L (ref 0–44)
AST: 27 U/L (ref 15–41)
Albumin: 2.9 g/dL — ABNORMAL LOW (ref 3.5–5.0)
Alkaline Phosphatase: 88 U/L (ref 38–126)
Anion gap: 7 (ref 5–15)
BUN: 5 mg/dL — ABNORMAL LOW (ref 6–20)
CO2: 25 mmol/L (ref 22–32)
Calcium: 8.1 mg/dL — ABNORMAL LOW (ref 8.9–10.3)
Chloride: 104 mmol/L (ref 98–111)
Creatinine, Ser: 0.48 mg/dL (ref 0.44–1.00)
GFR, Estimated: >60 mL/min (ref >60)
Glucose, Bld: 111 mg/dL — ABNORMAL HIGH (ref 70–99)
Potassium: 3.4 mmol/L — ABNORMAL LOW (ref 3.5–5.1)
Sodium: 136 mmol/L (ref 135–145)
Total Bilirubin: 0.7 mg/dL (ref 0.3–1.2)
Total Protein: 5.8 g/dL — ABNORMAL LOW (ref 6.5–8.1)

## 2023-02-02 LAB — GLUCOSE, CAPILLARY
Glucose-Capillary: 110 mg/dL — ABNORMAL HIGH (ref 70–99)
Glucose-Capillary: 112 mg/dL — ABNORMAL HIGH (ref 70–99)
Glucose-Capillary: 115 mg/dL — ABNORMAL HIGH (ref 70–99)
Glucose-Capillary: 140 mg/dL — ABNORMAL HIGH (ref 70–99)
Glucose-Capillary: 98 mg/dL (ref 70–99)

## 2023-02-02 LAB — TRIGLYCERIDES: Triglycerides: 101 mg/dL (ref <150)

## 2023-02-02 LAB — PHOSPHORUS: Phosphorus: 4.7 mg/dL — ABNORMAL HIGH (ref 2.5–4.6)

## 2023-02-02 LAB — MAGNESIUM: Magnesium: 1.9 mg/dL (ref 1.7–2.4)

## 2023-02-02 MED ORDER — POTASSIUM CHLORIDE 20 MEQ PO PACK
40.0000 meq | PACK | Freq: Once | ORAL | Status: AC
  Administered 2023-02-02: 40 meq via ORAL
  Filled 2023-02-02: qty 2

## 2023-02-02 MED ORDER — TRAVASOL 10 % IV SOLN
INTRAVENOUS | Status: AC
  Filled 2023-02-02: qty 499.2

## 2023-02-02 NOTE — Progress Notes (Signed)
[facility] Gastroenterology Progress Note  Monica [name] 47 y.o. Mar 08, 1976  CC: Bile leak.   Subjective: Patient seen and examined at bedside.  Drain output has decreased significantly but one of the drain had some blood.   ROS : Afebrile, negative for chest pain.   Objective: Vital signs in last 24 hours: Vitals:   02/02/23 0500 02/02/23 1009  BP: (!) 141/76 (!) 158/97  Pulse: (!) 103 81  Resp: 18 16  Temp: 98.5 F (36.9 C) 97.9 F (36.6 C)  SpO2: 100% 97%    Physical Exam:  General -alert but somewhat lethargic. No visible respiratory distress Abdomen  -generalized tenderness to palpation but  abdomen is soft, no peritoneal signs.  Multiple drains in place.  Drain to had some bloody drainage.  Lab Results: Recent Labs    02/01/23 1057 02/02/23 0831  NA 138 136  K 3.5 3.4*  CL 102 104  CO2 28 25  GLUCOSE 115* 111*  BUN 5* 5*  CREATININE 0.60 0.48  CALCIUM 8.3* 8.1*  MG 1.7 1.9  PHOS 5.0* 4.7*   Recent Labs    01/31/23 0444 02/02/23 0831  AST 9* 27  ALT 16 31  ALKPHOS 74 88  BILITOT 0.7 0.7  PROT 5.7* 5.8*  ALBUMIN 2.9* 2.9*   Recent Labs    01/31/23 0444  WBC 6.4  HGB 9.8*  HCT 31.0*  MCV 86.4  PLT 360   No results for input(s): "LABPROT", "INR" in the last 72 hours.     Assessment/Plan: -Bile leak possible bile peritonitis.  Unable to do IR guided drainage.  S/p diagnostic laparoscopy with washout and drain placement 01/29/2023  -History of Roux-en-Y gastric bypass -Constipation -likely from narcotic pain medication.  Recommendations ------------------------- -Patient with worsening abdominal pain.  Drain #2 showed some bloody drain output.  Surgical team is aware of it.   -Repeat CBC today.  If significant drop in hemoglobin, we may consider repeat CT scan. -Continue IV Zosyn.  Plan is to start TPN to allow for bowel rest. -Case discussed with Dr. Rush Landmark.  He recommended transfer to tertiary care center such as [facility]  if ERCP is clinically indicated because of complexity of the case given her history of gastric bypass. Otis Brace MD, [place] 02/02/2023, 11:11 AM  Contact #  [phone]

## 2023-02-02 NOTE — Progress Notes (Signed)
PHARMACY - TOTAL PARENTERAL NUTRITION CONSULT NOTE   Indication: Prolonged ileus  Patient Measurements: Actual body weight: 90.3 kg Height: 5'8"  Assessment: Pt is a 32 yoF who is s/p Roux-en-Y and appendectomy in September of 2023. Pt underwent elective lap chole on 3/7 then presented to the ED on 3/9 with abdominal pain. Pt found to have post-op biloma/bile leak and possible bile peritonitis. Pt underwent urgent laparoscopic diagnostic with washout and drain placement on 3/11. Pharmacy consulted to manage TPN on 3/14.   Glucose / Insulin: No history of DM. CBG controlled < 150 Electrolytes: wnl exc K borderline low and Phos 4.7 but continues to trend down  Renal: SCr WNL, BUN low Hepatic: LFTs, T.bili, Alk Phos all WNL Intake / Output; MIVF: Strict I/O not recorded. Last BM 3/14, has bowel regimen -UOP: 2400 mL + 3 unmeasured. Drain: decreased to 305 mL -D10 @ 30 mL/hr -NS @ 65ml/hr GI Imaging: -HIDA: Findings compatible with bile leak GI Surgeries / Procedures:  -08/07/22: Roux-en-Y, appendectomy -01/25/23: Lap chole with intraoperative cholangiogram -02/06/23: CT guided drain of biloma by IR (2-3 mL dark thin amber fluid) -01/29/23: Laparascopic diagnostic with washout and drain placement (3 JP drains)  Central access: PICC ordered 3/14 TPN start date: 3/15. TPN supposed to start on 3/14 but there was an issue with the filter so D10 hung in its place.  Nutritional Goals: Goal TPN rate is 80 mL/hr (provides 100 g of protein and 1728 kcals per day)  RD Assessment: Estimated Needs Total Energy Estimated Needs: 1600-1800 kcal/day Total Protein Estimated Needs: 95-105 g protein/day Total Fluid Estimated Needs: >/= 2L  Current Nutrition:  Bariatric full liquid Supplements: Ensure Max TID (ordered but not drinking so far)  Plan:  Now:  Give KCl 65mEq packet x 1  At 1800: Start TPN at 40 mL/hr at 1800 Will provide 50 g protein, 864 kcal, and 125 g dextrose Electrolytes in TPN:  Standard, except remove Phos  Na 50 mEq/L, K 50 mEq/L, Ca 5 mEq/L, Mg 5 mEq/L, and Phos 0 mmol/L.  Cl:Ac 1:1 Add standard MVI and trace elements to TPN. Additional 100mg  of thiamine added Continue Sensitive q6h SSI and adjust as needed  Stop D10 infusion at 1800 Continue NS at 66ml/hr. Will decrease as TPN advances to goal. Monitor TPN labs on Mon/Thurs, check Bmet, Mg, and Phos tomorrow  Elenor Quinones, PharmD, BCPS, BCIDP Clinical Pharmacist 02/02/2023 9:43 AM

## 2023-02-02 NOTE — Progress Notes (Signed)
Patient called nursing staff into room @ [phone]. Patient stating 10/10 pain and was shaking in pain.   Patient given IV Dilaudid and she felt nauseous so she was given IV compazine as well. Patient's JP drains emptying pretty moderately again after she had stood up and sat down a few times in attempt to get comfortable.   Patient now resting in bed sipping sugar-free drink and attempting to go to sleep.

## 2023-02-02 NOTE — Progress Notes (Signed)
Progress Note: General Surgery Service   Chief Complaint/Subjective: Drainage continues to slow but pain worse yesterday after going outside.  Now #2 drain with some dark blood coloration.  Objective: Vital signs in last 24 hours: Temp:  [97.6 F (36.4 C)-98.7 F (37.1 C)] 98.5 F (36.9 C) (03/15 0500) Pulse Rate:  [88-103] 103 (03/15 0500) Resp:  [17-20] 18 (03/15 0500) BP: (130-151)/(73-94) 141/76 (03/15 0500) SpO2:  [97 %-100 %] 100 % (03/15 0500) Weight:  [89 kg] 89 kg (03/14 1054) Last BM Date : 02/01/23  Intake/Output from previous day: 03/14 0701 - 03/15 0700 In: 1500.4 [P.O.:330; I.V.:824.3; IV Piggyback:346.1] Out: 3005 [Urine:2700; Drains:305] Intake/Output this shift: Total I/O In: -  Out: 580 [Urine:300; Drains:280]  GI: All three drains with bilious drainage, drain #2 with dark blood color today  Lab Results: CBC  Recent Labs    01/31/23 0444  WBC 6.4  HGB 9.8*  HCT 31.0*  PLT 360    BMET Recent Labs    02/01/23 1057 02/02/23 0831  NA 138 136  K 3.5 3.4*  CL 102 104  CO2 28 25  GLUCOSE 115* 111*  BUN 5* 5*  CREATININE 0.60 0.48  CALCIUM 8.3* 8.1*    PT/INR No results for input(s): "LABPROT", "INR" in the last 72 hours. ABG No results for input(s): "PHART", "HCO3" in the last 72 hours.  Invalid input(s): "PCO2", "PO2"  Anti-infectives: Anti-infectives (From admission, onward)    Start     Dose/Rate Route Frequency Ordered Stop   01/29/23 1115  valACYclovir (VALTREX) tablet 500 mg        500 mg Oral Daily 01/29/23 1025     01/27/23 2200  piperacillin-tazobactam (ZOSYN) IVPB 3.375 g        3.375 g 12.5 mL/hr over 240 Minutes Intravenous Every 8 hours 01/27/23 1822     01/27/23 1330  piperacillin-tazobactam (ZOSYN) IVPB 3.375 g        3.375 g 12.5 mL/hr over 240 Minutes Intravenous  Once 01/27/23 1321 01/27/23 1715       Medications: Scheduled Meds:  Chlorhexidine Gluconate Cloth  6 each Topical Daily   enoxaparin (LOVENOX)  injection  40 mg Subcutaneous Q24H   insulin aspart  0-9 Units Subcutaneous Q6H   metoCLOPramide (REGLAN) injection  10 mg Intravenous Q8H   polyethylene glycol  17 g Oral BID   potassium chloride  40 mEq Oral Once   Ensure Max Protein  11 oz Oral TID BM   sodium chloride flush  10-40 mL Intracatheter Q12H   valACYclovir  500 mg Oral Daily   Continuous Infusions:  sodium chloride 70 mL/hr at 02/01/23 2050   dextrose 30 mL/hr at 02/01/23 2050   methocarbamol (ROBAXIN) IV 500 mg (02/02/23 0527)   piperacillin-tazobactam (ZOSYN)  IV 3.375 g (02/02/23 0314)   PRN Meds:.fluticasone, HYDROcodone-acetaminophen, HYDROmorphone (DILAUDID) injection, methocarbamol (ROBAXIN) IV, ondansetron **OR** ondansetron (ZOFRAN) IV, prochlorperazine, sodium chloride flush  Assessment/Plan: Monica [name] has a history of gastric bypass and underwent laparoscopic cholecystectomy with intraoperative cholangiogram on 01/25/23.  She was readmitted 01/27/23 with a bile leak, unable to be drained by IR.  She then underwent diagnostic laparoscopy with abdominal washout and three JP drains placed.  These drains traversed a fluid collection between the internal and external oblique on the right abdomen which may be a hematoma or biloma but will hopefully drain along the drain track of drain #1 and #2 as well.  Drainage continues to slow, but pain worse Unfortunately, GI unable to  assist with transgastric ERCP in the coming weeks, so this procedure would have to be performed at another hospital. Hopefully drainage continues to slow and symptoms improve over the weekend and we could get to a point where she could leave the hospital with drains in place and allow liver to heal without additional intervention.   PICC/TPN ordered yesterday Pain control, frequent dressing changes, frequent JP emptying     LOS: 6 days    Monica Morn, MD  [facility] Use [email or web address] to contact on call provider  Daily  Billing: [phone] - post op

## 2023-02-03 LAB — CBC
HCT: 32.5 % — ABNORMAL LOW (ref 36.0–46.0)
Hemoglobin: 10.4 g/dL — ABNORMAL LOW (ref 12.0–15.0)
MCH: 27.6 pg (ref 26.0–34.0)
MCHC: 32 g/dL (ref 30.0–36.0)
MCV: 86.2 fL (ref 80.0–100.0)
Platelets: 444 10*3/uL — ABNORMAL HIGH (ref 150–400)
RBC: 3.77 MIL/uL — ABNORMAL LOW (ref 3.87–5.11)
RDW: 14.2 % (ref 11.5–15.5)
WBC: 8.1 10*3/uL (ref 4.0–10.5)
nRBC: 0 % (ref 0.0–0.2)

## 2023-02-03 LAB — HEPATIC FUNCTION PANEL
ALT: 35 U/L (ref 0–44)
AST: 27 U/L (ref 15–41)
Albumin: 3 g/dL — ABNORMAL LOW (ref 3.5–5.0)
Alkaline Phosphatase: 88 U/L (ref 38–126)
Bilirubin, Direct: 0.2 mg/dL (ref 0.0–0.2)
Indirect Bilirubin: 0.5 mg/dL (ref 0.3–0.9)
Total Bilirubin: 0.7 mg/dL (ref 0.3–1.2)
Total Protein: 5.7 g/dL — ABNORMAL LOW (ref 6.5–8.1)

## 2023-02-03 LAB — BASIC METABOLIC PANEL
Anion gap: 7 (ref 5–15)
BUN: 7 mg/dL (ref 6–20)
CO2: 28 mmol/L (ref 22–32)
Calcium: 8.4 mg/dL — ABNORMAL LOW (ref 8.9–10.3)
Chloride: 102 mmol/L (ref 98–111)
Creatinine, Ser: 0.61 mg/dL (ref 0.44–1.00)
GFR, Estimated: >60 mL/min (ref >60)
Glucose, Bld: 126 mg/dL — ABNORMAL HIGH (ref 70–99)
Potassium: 3.7 mmol/L (ref 3.5–5.1)
Sodium: 137 mmol/L (ref 135–145)

## 2023-02-03 LAB — GLUCOSE, CAPILLARY
Glucose-Capillary: 128 mg/dL — ABNORMAL HIGH (ref 70–99)
Glucose-Capillary: 135 mg/dL — ABNORMAL HIGH (ref 70–99)
Glucose-Capillary: 142 mg/dL — ABNORMAL HIGH (ref 70–99)
Glucose-Capillary: 145 mg/dL — ABNORMAL HIGH (ref 70–99)

## 2023-02-03 LAB — PHOSPHORUS: Phosphorus: 3.7 mg/dL (ref 2.5–4.6)

## 2023-02-03 LAB — MAGNESIUM: Magnesium: 2.2 mg/dL (ref 1.7–2.4)

## 2023-02-03 MED ORDER — SODIUM CHLORIDE 0.9 % IV SOLN: INTRAVENOUS | Status: AC

## 2023-02-03 MED ORDER — TRAVASOL 10 % IV SOLN
INTRAVENOUS | Status: AC
  Filled 2023-02-03: qty 748.8

## 2023-02-03 MED ORDER — NALOXONE HCL 0.4 MG/ML IJ SOLN: 0.4000 mg | INTRAMUSCULAR | Status: DC | PRN

## 2023-02-03 MED ORDER — DIPHENHYDRAMINE HCL 50 MG/ML IJ SOLN: 12.5000 mg | Freq: Four times a day (QID) | INTRAMUSCULAR | Status: DC | PRN

## 2023-02-03 MED ORDER — DIPHENHYDRAMINE HCL 12.5 MG/5ML PO ELIX: 12.5000 mg | ORAL_SOLUTION | Freq: Four times a day (QID) | ORAL | Status: DC | PRN

## 2023-02-03 MED ORDER — SODIUM CHLORIDE 0.9% FLUSH: 9.0000 mL | INTRAVENOUS | Status: DC | PRN

## 2023-02-03 MED ORDER — ONDANSETRON HCL 4 MG/2ML IJ SOLN: 4.0000 mg | Freq: Four times a day (QID) | INTRAMUSCULAR | Status: DC | PRN

## 2023-02-03 MED ORDER — BISACODYL 10 MG RE SUPP
10.0000 mg | Freq: Two times a day (BID) | RECTAL | Status: DC | PRN
  Administered 2023-02-05: 10 mg via RECTAL
  Filled 2023-02-03: qty 1

## 2023-02-03 MED ORDER — FLUCONAZOLE 150 MG PO TABS
150.0000 mg | ORAL_TABLET | Freq: Every day | ORAL | Status: AC
  Administered 2023-02-03 – 2023-02-04 (×2): 150 mg via ORAL
  Filled 2023-02-03 (×2): qty 1

## 2023-02-03 MED ORDER — HYDROMORPHONE 1 MG/ML IV SOLN: INTRAVENOUS | Status: DC

## 2023-02-03 MED ORDER — HYDROMORPHONE 1 MG/ML IV SOLN
INTRAVENOUS | Status: DC
  Administered 2023-02-03: 30 mg via INTRAVENOUS
  Administered 2023-02-04 (×2): 1.2 mg via INTRAVENOUS
  Administered 2023-02-04: 2.1 mg via INTRAVENOUS
  Administered 2023-02-05: 0.3 mg via INTRAVENOUS
  Administered 2023-02-06: 0.6 mg via INTRAVENOUS
  Filled 2023-02-03: qty 30

## 2023-02-03 NOTE — Progress Notes (Addendum)
Progress Note: General Surgery Service   Chief Complaint/Subjective: Drainage significantly increased yesterday.  1277ml out yesterday.  Pain also increased  Objective: Vital signs in last 24 hours: Temp:  [97.9 F (36.6 C)-98.6 F (37 C)] 98.6 F (37 C) (03/16 0543) Pulse Rate:  [76-82] 81 (03/16 0543) Resp:  [14-18] 18 (03/16 0543) BP: (114-158)/(73-97) 114/73 (03/16 0543) SpO2:  [94 %-99 %] 94 % (03/16 0543) Last BM Date : 02/01/23  Intake/Output from previous day: 03/15 0701 - 03/16 0700 In: 3279.7 [P.O.:1800; I.V.:1321.9; IV Piggyback:157.7] Out: 4030 [Urine:2800; Drains:1230] Intake/Output this shift: No intake/output data recorded.  GI: All three drains with bilious drainage  Lab Results: CBC  Recent Labs    02/03/23 0440  WBC 8.1  HGB 10.4*  HCT 32.5*  PLT 444*    BMET Recent Labs    02/02/23 0831 02/03/23 0440  NA 136 137  K 3.4* 3.7  CL 104 102  CO2 25 28  GLUCOSE 111* 126*  BUN 5* 7  CREATININE 0.48 0.61  CALCIUM 8.1* 8.4*    PT/INR No results for input(s): "LABPROT", "INR" in the last 72 hours. ABG No results for input(s): "PHART", "HCO3" in the last 72 hours.  Invalid input(s): "PCO2", "PO2"  Anti-infectives: Anti-infectives (From admission, onward)    Start     Dose/Rate Route Frequency Ordered Stop   02/03/23 1000  fluconazole (DIFLUCAN) tablet 150 mg        150 mg Oral Daily 02/03/23 0907 02/05/23 0959   01/29/23 1115  valACYclovir (VALTREX) tablet 500 mg        500 mg Oral Daily 01/29/23 1025     01/27/23 2200  piperacillin-tazobactam (ZOSYN) IVPB 3.375 g        3.375 g 12.5 mL/hr over 240 Minutes Intravenous Every 8 hours 01/27/23 1822     01/27/23 1330  piperacillin-tazobactam (ZOSYN) IVPB 3.375 g        3.375 g 12.5 mL/hr over 240 Minutes Intravenous  Once 01/27/23 1321 01/27/23 1715       Medications: Scheduled Meds:  Chlorhexidine Gluconate Cloth  6 each Topical Daily   enoxaparin (LOVENOX) injection  40 mg  Subcutaneous Q24H   fluconazole  150 mg Oral Daily   insulin aspart  0-9 Units Subcutaneous Q6H   metoCLOPramide (REGLAN) injection  10 mg Intravenous Q8H   polyethylene glycol  17 g Oral BID   Ensure Max Protein  11 oz Oral TID BM   sodium chloride flush  10-40 mL Intracatheter Q12H   valACYclovir  500 mg Oral Daily   Continuous Infusions:  sodium chloride Stopped (02/02/23 1946)   sodium chloride     methocarbamol (ROBAXIN) IV 500 mg (02/02/23 0527)   piperacillin-tazobactam (ZOSYN)  IV 3.375 g (02/03/23 0406)   TPN ADULT (ION) 40 mL/hr at 02/02/23 2000   TPN ADULT (ION)     PRN Meds:.bisacodyl, fluticasone, HYDROcodone-acetaminophen, HYDROmorphone (DILAUDID) injection, methocarbamol (ROBAXIN) IV, ondansetron **OR** ondansetron (ZOFRAN) IV, prochlorperazine, sodium chloride flush  Assessment/Plan: Ms. Quinton has a history of gastric bypass and underwent laparoscopic cholecystectomy with intraoperative cholangiogram on 01/25/23.  She was readmitted 01/27/23 with a bile leak, unable to be drained by IR.  She then underwent diagnostic laparoscopy with abdominal washout and three JP drains placed.  These drains traversed a fluid collection between the internal and external oblique on the right abdomen which may be a hematoma or biloma but will hopefully drain along the drain track of drain #1 and #2 as well.  Drainage  has increased, and pain worse Unfortunately, GI unable to assist with transgastric ERCP in the coming weeks, so this procedure would have to be performed at another hospital.   PICC/TPN started 3/16 Pain control, frequent dressing changes, frequent JP emptying  Pt feels like she is getting yeast infection: treated 3/16-17 with Diflucan  Having some constipation, suppository ordered, encouraged her to use Miralax as well   LOS: 7 days    Rosario Adie, MD  [facility] Use [email or web address] to contact on call provider  Daily Billing: [phone] - post op

## 2023-02-03 NOTE — Progress Notes (Signed)
Left JP drain site continues to drain large amounts of clear yellow/brown drainage at insertion site. Dressing saturated x3 this shift. Right JP x2 dressings changed once this shift. Moderate amount serousang. Drainage

## 2023-02-03 NOTE — Progress Notes (Addendum)
PHARMACY - TOTAL PARENTERAL NUTRITION CONSULT NOTE   Indication: Prolonged ileus  Patient Measurements: Actual body weight: 90.3 kg Height: 5'8"  Assessment: Pt is a 70 yoF who is s/p Roux-en-Y and appendectomy in September of 2023. Pt underwent elective lap chole on 3/7 then presented to the ED on 3/9 with abdominal pain. Pt found to have post-op biloma/bile leak and possible bile peritonitis. Pt underwent urgent laparoscopic diagnostic with washout and drain placement on 3/11. Pharmacy consulted to manage TPN on 3/14.   Recent Labs    02/02/23 0831 02/03/23 0440  NA 136 137  K 3.4* 3.7  CL 104 102  CO2 25 28  GLUCOSE 111* 126*  BUN 5* 7  CREATININE 0.48 0.61  CALCIUM 8.1* 8.4*  PHOS 4.7* 3.7  MG 1.9 2.2  ALBUMIN 2.9* 3.0*  ALKPHOS 88 88  AST 27 27  ALT 31 35  BILITOT 0.7 0.7  TRIG 101  --      Glucose / Insulin: No history of DM. CBGs < 150 on SSI. Electrolytes: K improving and now WNL.  Phos improved and now WNL.  Corr Ca WNL. Renal: SCr and BUN  WNL. Hepatic: LFTs, T.bili, Alk Phos all WNL Intake / Output; MIVF: I/O recorded as (-) 940 mL including 1070 mL drain output. Last BM 3/14, has bowel regimen -TPN @ 45ml/hr -NS @ 9ml/hr GI Imaging: -HIDA: Findings compatible with bile leak GI Surgeries / Procedures:  -08/07/22: Roux-en-Y, appendectomy -01/25/23: Lap chole with intraoperative cholangiogram -02/06/23: CT guided drain of biloma by IR (2-3 mL dark thin amber fluid) -01/29/23: Laparascopic diagnostic with washout and drain placement (3 JP drains)  Central access: PICC ordered 3/14 TPN start date: 3/15. TPN supposed to start on 3/14 but there was an issue with the filter so D10 hung in its place.  Nutritional Goals: Goal TPN rate is 80 mL/hr (provides 100 g of protein and 1728 kcals per day)  RD Assessment: Estimated Needs Total Energy Estimated Needs: 1600-1800 kcal/day Total Protein Estimated Needs: 95-105 g protein/day Total Fluid Estimated Needs: >/=  2L  Current Nutrition:  Bariatric full liquid Supplements: Ensure Max TID - 1 dose charted yesterday  Plan:  At 1800: Increase TPN to 60 mL/hr Electrolytes in TPN:  no change for today Na 50 mEq/L  K 50 mEq/L Ca 5 mEq/L  Mg 5 mEq/L Phos 0 mmol/L Cl:Ac 1:1 Standard MVI and trace elements in TPN. Additional 100mg  of thiamine added. Continue Sensitive q6h SSI and adjust as needed  Reduce NS to 50 mL/hr. Will decrease further as TPN advances to goal. Monitor TPN labs on Mon/Thurs; recheck Bmet, Mg, and Phos tomorrow  Clayburn Pert, PharmD, BCPS Clinical Pharmacist 02/03/2023  7:09 AM

## 2023-02-03 NOTE — Progress Notes (Signed)
[facility] Gastroenterology Progress Note  Monica [name] 47 y.o. 12/30/1975  CC: Bile leak.   Subjective: Patient seen and examined at bedside.  Drain output increased yesterday.  Abdominal pain is also increasing.  No bowel movement yet.  No blood in the drainage.  ROS : Afebrile, negative for chest pain.   Objective: Vital signs in last 24 hours: Vitals:   02/03/23 0543 02/03/23 0951  BP: 114/73 124/79  Pulse: 81 79  Resp: 18 14  Temp: 98.6 F (37 C) 98.6 F (37 C)  SpO2: 94% 95%    Physical Exam:  General -alert but somewhat lethargic. No visible respiratory distress Abdomen  -generalized tenderness to palpation but  abdomen is soft, no peritoneal signs.  Multiple drains in place.    Lab Results: Recent Labs    02/02/23 0831 02/03/23 0440  NA 136 137  K 3.4* 3.7  CL 104 102  CO2 25 28  GLUCOSE 111* 126*  BUN 5* 7  CREATININE 0.48 0.61  CALCIUM 8.1* 8.4*  MG 1.9 2.2  PHOS 4.7* 3.7   Recent Labs    02/02/23 0831 02/03/23 0440  AST 27 27  ALT 31 35  ALKPHOS 88 88  BILITOT 0.7 0.7  PROT 5.8* 5.7*  ALBUMIN 2.9* 3.0*   Recent Labs    02/03/23 0440  WBC 8.1  HGB 10.4*  HCT 32.5*  MCV 86.2  PLT 444*   No results for input(s): "LABPROT", "INR" in the last 72 hours.     Assessment/Plan: -Bile leak possible bile peritonitis.  Unable to do IR guided drainage.  S/p diagnostic laparoscopy with washout and drain placement 01/29/2023  -History of Roux-en-Y gastric bypass -Constipation -likely from narcotic pain medication.  Recommendations ------------------------- -Patient with increasing drain output and worsening abdominal pain.  I will reach out to tertiary care center/[facility] to arrange for transfer for ERCP with transgastric approach -Continue MiraLAX and Dulcolax suppositories for constipation. -Continue IV antibiotics -Continue other supportive care   Otis Brace MD, FACP 02/03/2023, 12:30 PM  Contact #  [phone]

## 2023-02-03 NOTE — Progress Notes (Cosign Needed)
Per request of Dr Otis Brace, pt facesheet faxed to [facility]; electronic confirmation received; MD notified.

## 2023-02-04 LAB — GLUCOSE, CAPILLARY
Glucose-Capillary: 116 mg/dL — ABNORMAL HIGH (ref 70–99)
Glucose-Capillary: 126 mg/dL — ABNORMAL HIGH (ref 70–99)
Glucose-Capillary: 136 mg/dL — ABNORMAL HIGH (ref 70–99)
Glucose-Capillary: 137 mg/dL — ABNORMAL HIGH (ref 70–99)

## 2023-02-04 LAB — HEPATIC FUNCTION PANEL
ALT: 34 U/L (ref 0–44)
AST: 22 U/L (ref 15–41)
Albumin: 2.9 g/dL — ABNORMAL LOW (ref 3.5–5.0)
Alkaline Phosphatase: 87 U/L (ref 38–126)
Bilirubin, Direct: 0.2 mg/dL (ref 0.0–0.2)
Indirect Bilirubin: 0.5 mg/dL (ref 0.3–0.9)
Total Bilirubin: 0.7 mg/dL (ref 0.3–1.2)
Total Protein: 5.5 g/dL — ABNORMAL LOW (ref 6.5–8.1)

## 2023-02-04 LAB — CBC
HCT: 32.7 % — ABNORMAL LOW (ref 36.0–46.0)
Hemoglobin: 10.3 g/dL — ABNORMAL LOW (ref 12.0–15.0)
MCH: 27.3 pg (ref 26.0–34.0)
MCHC: 31.5 g/dL (ref 30.0–36.0)
MCV: 86.7 fL (ref 80.0–100.0)
Platelets: 479 10*3/uL — ABNORMAL HIGH (ref 150–400)
RBC: 3.77 MIL/uL — ABNORMAL LOW (ref 3.87–5.11)
RDW: 14.5 % (ref 11.5–15.5)
WBC: 9.9 10*3/uL (ref 4.0–10.5)
nRBC: 0 % (ref 0.0–0.2)

## 2023-02-04 LAB — BASIC METABOLIC PANEL
Anion gap: 8 (ref 5–15)
BUN: 9 mg/dL (ref 6–20)
CO2: 26 mmol/L (ref 22–32)
Calcium: 8.3 mg/dL — ABNORMAL LOW (ref 8.9–10.3)
Chloride: 103 mmol/L (ref 98–111)
Creatinine, Ser: 0.57 mg/dL (ref 0.44–1.00)
GFR, Estimated: >60 mL/min (ref >60)
Glucose, Bld: 124 mg/dL — ABNORMAL HIGH (ref 70–99)
Potassium: 3.7 mmol/L (ref 3.5–5.1)
Sodium: 137 mmol/L (ref 135–145)

## 2023-02-04 LAB — PHOSPHORUS: Phosphorus: 3.7 mg/dL (ref 2.5–4.6)

## 2023-02-04 LAB — MAGNESIUM: Magnesium: 1.9 mg/dL (ref 1.7–2.4)

## 2023-02-04 MED ORDER — TRAVASOL 10 % IV SOLN
INTRAVENOUS | Status: AC
  Filled 2023-02-04: qty 998.4

## 2023-02-04 MED ORDER — SODIUM CHLORIDE 0.9 % IV SOLN: INTRAVENOUS | Status: DC

## 2023-02-04 MED ORDER — BISACODYL 10 MG RE SUPP
10.0000 mg | Freq: Once | RECTAL | Status: AC
  Administered 2023-02-04: 10 mg via RECTAL
  Filled 2023-02-04: qty 1

## 2023-02-04 NOTE — Progress Notes (Signed)
PHARMACY - TOTAL PARENTERAL NUTRITION CONSULT NOTE   Indication: Prolonged ileus  Patient Measurements: Actual body weight: 90.3 kg Height: 5'8"  Assessment: Pt is a 23 yoF who is s/p Roux-en-Y and appendectomy in September of 2023. Pt underwent elective lap chole on 3/7 then presented to the ED on 3/9 with abdominal pain. Pt found to have post-op biloma/bile leak and possible bile peritonitis. Pt underwent urgent laparoscopic diagnostic with washout and drain placement on 3/11. Pharmacy consulted to manage TPN on 3/14.   Recent Labs    02/02/23 0831 02/03/23 0440 02/04/23 0253  NA 136 137 137  K 3.4* 3.7 3.7  CL 104 102 103  CO2 25 28 26   GLUCOSE 111* 126* 124*  BUN 5* 7 9  CREATININE 0.48 0.61 0.57  CALCIUM 8.1* 8.4* 8.3*  PHOS 4.7* 3.7 3.7  MG 1.9 2.2 1.9  ALBUMIN 2.9* 3.0* 2.9*  ALKPHOS 88 88 87  AST 27 27 22   ALT 31 35 34  BILITOT 0.7 0.7 0.7  TRIG 101  --   --       Glucose / Insulin: No history of DM.  - CBGs < 150 on SSI. - 5 units SSI required in past 24hr Electrolytes: all WNL including CorrCa Renal: SCr and BUN  WNL. Hepatic: LFTs, T.bili, Alk Phos all WNL, Alb low Intake / Output; MIVF: I/O recorded as (+) 2673 mL including 495 mL drain output. Last BM 3/14, has bowel regimen -TPN @ 58ml/hr -NS @ 38ml/hr GI Imaging: -HIDA: Findings compatible with bile leak GI Surgeries / Procedures:  -08/07/22: Roux-en-Y, appendectomy -01/25/23: Lap chole with intraoperative cholangiogram -02/06/23: CT guided drain of biloma by IR (2-3 mL dark thin amber fluid) -01/29/23: Laparascopic diagnostic with washout and drain placement (3 JP drains)  Central access: PICC ordered 3/14 TPN start date: 3/15. TPN supposed to start on 3/14 but there was an issue with the filter so D10 hung in its place.  Nutritional Goals: Goal TPN rate is 80 mL/hr (provides 100 g of protein and 1728 kcals per day)  RD Assessment: Estimated Needs Total Energy Estimated Needs: 1600-1800  kcal/day Total Protein Estimated Needs: 95-105 g protein/day Total Fluid Estimated Needs: >/= 2L  Current Nutrition:  Bariatric full liquid Supplements: Ensure Max TID - 2 doses charted yesterday  Plan:  At 1800: Increase TPN to 80 mL/hr Electrolytes in TPN:  no change for today Na 50 mEq/L  K 50 mEq/L Ca 5 mEq/L  Mg 5 mEq/L Phos 0 mmol/L Cl:Ac 1:1 Standard MVI and trace elements in TPN. Additional 100mg  of thiamine added. Continue Sensitive q6h SSI and adjust as needed  Reduce NS to 30 mL/hr.  Monitor TPN labs on Mon/Thurs and PRN  Peggyann Juba, PharmD, BCPS Pharmacy: [phone] 02/04/2023  6:59 AM

## 2023-02-04 NOTE — Progress Notes (Signed)
6 Days Post-Op   Subjective/Chief Complaint: Patient about the same.  Pain control an issue but she seems comfortable this morning.   Objective: Vital signs in last 24 hours: Temp:  [98 F (36.7 C)-98.6 F (37 C)] 98.2 F (36.8 C) (03/17 0933) Pulse Rate:  [78-99] 90 (03/17 0933) Resp:  [11-18] 18 (03/17 0933) BP: (106-140)/(72-90) 106/72 (03/17 0933) SpO2:  [94 %-98 %] 95 % (03/17 0933) FiO2 (%):  [0 %] 0 % (03/17 0540) Last BM Date : 02/01/23  Intake/Output from previous day: 03/16 0701 - 03/17 0700 In: 4418.3 [P.O.:1500; I.V.:2741.9; IV Piggyback:176.4] Out: 1745 [Urine:1250; Drains:495] Intake/Output this shift: No intake/output data recorded.  Abdomen: Port sites intact.  3 drains working.  There is some drainage around all 3 drains which appears bilious.  There is no evidence of peritonitis.  Lab Results:  Recent Labs    02/03/23 0440 02/04/23 0253  WBC 8.1 9.9  HGB 10.4* 10.3*  HCT 32.5* 32.7*  PLT 444* 479*   BMET Recent Labs    02/03/23 0440 02/04/23 0253  NA 137 137  K 3.7 3.7  CL 102 103  CO2 28 26  GLUCOSE 126* 124*  BUN 7 9  CREATININE 0.61 0.57  CALCIUM 8.4* 8.3*   PT/INR No results for input(s): "LABPROT", "INR" in the last 72 hours. ABG No results for input(s): "PHART", "HCO3" in the last 72 hours.  Invalid input(s): "PCO2", "PO2"  Studies/Results: No results found.  Anti-infectives: Anti-infectives (From admission, onward)    Start     Dose/Rate Route Frequency Ordered Stop   02/03/23 1000  fluconazole (DIFLUCAN) tablet 150 mg        150 mg Oral Daily 02/03/23 0907 02/05/23 0959   01/29/23 1115  valACYclovir (VALTREX) tablet 500 mg        500 mg Oral Daily 01/29/23 1025     01/27/23 2200  piperacillin-tazobactam (ZOSYN) IVPB 3.375 g        3.375 g 12.5 mL/hr over 240 Minutes Intravenous Every 8 hours 01/27/23 1822     01/27/23 1330  piperacillin-tazobactam (ZOSYN) IVPB 3.375 g        3.375 g 12.5 mL/hr over 240 Minutes  Intravenous  Once 01/27/23 1321 01/27/23 1715       Assessment/Plan: s/p Procedure(s): LAPAROSCOPY DIAGNOSTIC WITH WASHOUT AND DRAIN PLACEMENT (N/A)   drainage has increased, and pain worse Unfortunately, GI unable to assist with transgastric ERCP in the coming weeks, so this procedure would have to be performed at another hospital.   PICC/TPN started 3/16 Pain control, frequent dressing changes, frequent JP emptying   Awaiting placement for GI intervention  Overall stable  LOS: 8 days    Turner Daniels MD 02/04/2023

## 2023-02-04 NOTE — Progress Notes (Signed)
[facility] Gastroenterology Progress Note  Monica [name] 47 y.o. 1976-07-05  CC: Bile leak.   Subjective: Patient seen and examined at bedside.  Continues to have abdominal pain.  Continues to have bilious output through all 3 drains.  ROS : Afebrile, negative for chest pain.   Objective: Vital signs in last 24 hours: Vitals:   02/04/23 0554 02/04/23 0933  BP: 119/77 106/72  Pulse: 94 90  Resp: 16 18  Temp: 98.4 F (36.9 C) 98.2 F (36.8 C)  SpO2: 94% 95%    Physical Exam:  General -alert and oriented x 3, not in acute distress. No visible respiratory distress Abdomen  -generalized tenderness to palpation but  abdomen is soft, no peritoneal signs.  Multiple drains in place.    Lab Results: Recent Labs    02/03/23 0440 02/04/23 0253  NA 137 137  K 3.7 3.7  CL 102 103  CO2 28 26  GLUCOSE 126* 124*  BUN 7 9  CREATININE 0.61 0.57  CALCIUM 8.4* 8.3*  MG 2.2 1.9  PHOS 3.7 3.7   Recent Labs    02/03/23 0440 02/04/23 0253  AST 27 22  ALT 35 34  ALKPHOS 88 87  BILITOT 0.7 0.7  PROT 5.7* 5.5*  ALBUMIN 3.0* 2.9*   Recent Labs    02/03/23 0440 02/04/23 0253  WBC 8.1 9.9  HGB 10.4* 10.3*  HCT 32.5* 32.7*  MCV 86.2 86.7  PLT 444* 479*   No results for input(s): "LABPROT", "INR" in the last 72 hours.     Assessment/Plan: -Bile leak possible bile peritonitis.  Unable to do IR guided drainage.  S/p diagnostic laparoscopy with washout and drain placement 01/29/2023  -History of Roux-en-Y gastric bypass -Constipation -likely from narcotic pain medication.  Recommendations ------------------------- -Case discussed with [facility] attending Dr. Jacqulyn Ducking yesterday.  Unfortunately they are at capacity but they may look at a day trip to plan for advanced ERCP with transgastric approach biliary stenting and then send patient back here for observation. -Started on TPN February 03, 2023. -Continue MiraLAX and Dulcolax suppositories for constipation. -Continue IV  antibiotics -Continue other supportive care   Otis Brace MD, FACP 02/04/2023, 10:35 AM  Contact #  [phone]

## 2023-02-05 LAB — URINALYSIS, ROUTINE W REFLEX MICROSCOPIC
Bacteria, UA: NONE SEEN
Bilirubin Urine: NEGATIVE
Glucose, UA: NEGATIVE mg/dL
Ketones, ur: NEGATIVE mg/dL
Leukocytes,Ua: NEGATIVE
Nitrite: NEGATIVE
Protein, ur: NEGATIVE mg/dL
Specific Gravity, Urine: 1.012 (ref 1.005–1.030)
pH: 8 (ref 5.0–8.0)

## 2023-02-05 LAB — CBC
HCT: 33.1 % — ABNORMAL LOW (ref 36.0–46.0)
Hemoglobin: 10.5 g/dL — ABNORMAL LOW (ref 12.0–15.0)
MCH: 27.7 pg (ref 26.0–34.0)
MCHC: 31.7 g/dL (ref 30.0–36.0)
MCV: 87.3 fL (ref 80.0–100.0)
Platelets: 485 10*3/uL — ABNORMAL HIGH (ref 150–400)
RBC: 3.79 MIL/uL — ABNORMAL LOW (ref 3.87–5.11)
RDW: 14.4 % (ref 11.5–15.5)
WBC: 9.2 10*3/uL (ref 4.0–10.5)
nRBC: 0 % (ref 0.0–0.2)

## 2023-02-05 LAB — COMPREHENSIVE METABOLIC PANEL
ALT: 36 U/L (ref 0–44)
AST: 25 U/L (ref 15–41)
Albumin: 3.1 g/dL — ABNORMAL LOW (ref 3.5–5.0)
Alkaline Phosphatase: 92 U/L (ref 38–126)
Anion gap: 9 (ref 5–15)
BUN: 11 mg/dL (ref 6–20)
CO2: 25 mmol/L (ref 22–32)
Calcium: 8.4 mg/dL — ABNORMAL LOW (ref 8.9–10.3)
Chloride: 102 mmol/L (ref 98–111)
Creatinine, Ser: 0.57 mg/dL (ref 0.44–1.00)
GFR, Estimated: >60 mL/min (ref >60)
Glucose, Bld: 116 mg/dL — ABNORMAL HIGH (ref 70–99)
Potassium: 3.8 mmol/L (ref 3.5–5.1)
Sodium: 136 mmol/L (ref 135–145)
Total Bilirubin: 0.7 mg/dL (ref 0.3–1.2)
Total Protein: 6 g/dL — ABNORMAL LOW (ref 6.5–8.1)

## 2023-02-05 LAB — GLUCOSE, CAPILLARY
Glucose-Capillary: 121 mg/dL — ABNORMAL HIGH (ref 70–99)
Glucose-Capillary: 122 mg/dL — ABNORMAL HIGH (ref 70–99)
Glucose-Capillary: 127 mg/dL — ABNORMAL HIGH (ref 70–99)
Glucose-Capillary: 134 mg/dL — ABNORMAL HIGH (ref 70–99)

## 2023-02-05 LAB — MAGNESIUM: Magnesium: 2 mg/dL (ref 1.7–2.4)

## 2023-02-05 LAB — TRIGLYCERIDES: Triglycerides: 109 mg/dL (ref <150)

## 2023-02-05 LAB — PHOSPHORUS: Phosphorus: 3.9 mg/dL (ref 2.5–4.6)

## 2023-02-05 MED ORDER — FLUCONAZOLE 100 MG PO TABS
100.0000 mg | ORAL_TABLET | Freq: Every day | ORAL | Status: DC
  Administered 2023-02-05 – 2023-02-07 (×3): 100 mg via ORAL
  Filled 2023-02-05 (×3): qty 1

## 2023-02-05 MED ORDER — TRAVASOL 10 % IV SOLN
INTRAVENOUS | Status: AC
  Filled 2023-02-05: qty 998.4

## 2023-02-05 NOTE — Progress Notes (Signed)
PHARMACY - TOTAL PARENTERAL NUTRITION CONSULT NOTE   Indication: Prolonged ileus  Patient Measurements: Actual body weight: 90.3 kg Height: 5'8"  Assessment: Pt is a 73 yoF who is s/p Roux-en-Y and appendectomy in September of 2023. Pt underwent elective lap chole on 3/7 then presented to the ED on 3/9 with abdominal pain. Pt found to have post-op biloma/bile leak and possible bile peritonitis. Pt underwent urgent laparoscopic diagnostic with washout and drain placement on 3/11. Pharmacy consulted to manage TPN on 3/14.   Recent Labs    02/04/23 0253 02/05/23 0249  NA 137 136  K 3.7 3.8  CL 103 102  CO2 26 25  GLUCOSE 124* 116*  BUN 9 11  CREATININE 0.57 0.57  CALCIUM 8.3* 8.4*  PHOS 3.7 3.9  MG 1.9 2.0  ALBUMIN 2.9* 3.1*  ALKPHOS 87 92  AST 22 25  ALT 34 36  BILITOT 0.7 0.7  TRIG  --  109      Glucose / Insulin: No history of DM.  - CBGs < 150 on SSI. - 3 units SSI required in past 24hr DC SSI/CBGs 3/18 Electrolytes: all WNL including CorrCa Renal: SCr and BUN  WNL.  No phos in TPN Hepatic: LFTs, T.bili, Alk Phos all WNL, Alb low Trig WNL Intake / Output; MIVF: I/O recorded as (+) 1218 mL including 500 mL drain output. Last BM 3/17, has bowel regimen: reglan 10 IV q6h ATC & miralax bid -TPN @ 72ml/hr -NS @ 65ml/hr GI Imaging: -HIDA: Findings compatible with bile leak GI Surgeries / Procedures:  -08/07/22: Roux-en-Y, appendectomy -01/25/23: Lap chole with intraoperative cholangiogram -02/06/23: CT guided drain of biloma by IR (2-3 mL dark thin amber fluid) -01/29/23: Laparascopic diagnostic with washout and drain placement (3 JP drains)  Central access: PICC placed 3/14 TPN start date: 3/15. TPN supposed to start on 3/14 but there was an issue with the TPN filter so D10 hung in its place.  Nutritional Goals: Goal TPN rate is 80 mL/hr (provides 100 g of protein and 1728 kcals per day)  RD Assessment: Estimated Needs Total Energy Estimated Needs: 1600-1800  kcal/day Total Protein Estimated Needs: 95-105 g protein/day Total Fluid Estimated Needs: >/= 2L  Current Nutrition:  Bariatric full liquid Supplements: Ensure Max TID - 1 dose charted yesterday  Plan:  At 1800: Continue TPN @ goal rate of 80 mL/hr - provides 100% of needs Electrolytes in TPN:  no change for today Na 50 mEq/L  K 50 mEq/L Ca 5 mEq/L  Mg 5 mEq/L Phos 0 mmol/L Cl:Ac 1:1 Standard MVI and trace elements in TPN. Additional 100mg  of thiamine added. D#4  DC SSI & DC CBGs NS @ 30 mL/hr or per MD Monitor TPN labs on Mon/Thurs and PRN   Eudelia Bunch, Pharm.D Use secure chat for questions 02/05/2023 10:04 AM

## 2023-02-05 NOTE — Progress Notes (Addendum)
7 Days Post-Op   Subjective/Chief Complaint: Pain last night was worse.  Drainage has slowed   Objective: Vital signs in last 24 hours: Temp:  [98.2 F (36.8 C)-98.7 F (37.1 C)] 98.4 F (36.9 C) (03/18 0938) Pulse Rate:  [87-100] 92 (03/18 0938) Resp:  [11-18] 16 (03/18 0938) BP: (100-120)/(64-79) 119/79 (03/18 0938) SpO2:  [94 %-98 %] 96 % (03/18 0938) Last BM Date : 02/01/23  Intake/Output from previous day: 03/17 0701 - 03/18 0700 In: 3718 [P.O.:900; I.V.:2657.6; IV Piggyback:160.5] Out: 2500 [Urine:2000; Drains:500] Intake/Output this shift: No intake/output data recorded.  Abdomen: Port sites intact.  3 drains working.  There is some drainage around all 3 drains which appears bilious.  There is no evidence of peritonitis.  Lab Results:  Recent Labs    02/04/23 0253 02/05/23 0249  WBC 9.9 9.2  HGB 10.3* 10.5*  HCT 32.7* 33.1*  PLT 479* 485*    BMET Recent Labs    02/04/23 0253 02/05/23 0249  NA 137 136  K 3.7 3.8  CL 103 102  CO2 26 25  GLUCOSE 124* 116*  BUN 9 11  CREATININE 0.57 0.57  CALCIUM 8.3* 8.4*    PT/INR No results for input(s): "LABPROT", "INR" in the last 72 hours. ABG No results for input(s): "PHART", "HCO3" in the last 72 hours.  Invalid input(s): "PCO2", "PO2"  Studies/Results: No results found.  Anti-infectives: Anti-infectives (From admission, onward)    Start     Dose/Rate Route Frequency Ordered Stop   02/03/23 1000  fluconazole (DIFLUCAN) tablet 150 mg        150 mg Oral Daily 02/03/23 0907 02/04/23 1102   01/29/23 1115  valACYclovir (VALTREX) tablet 500 mg        500 mg Oral Daily 01/29/23 1025     01/27/23 2200  piperacillin-tazobactam (ZOSYN) IVPB 3.375 g        3.375 g 12.5 mL/hr over 240 Minutes Intravenous Every 8 hours 01/27/23 1822     01/27/23 1330  piperacillin-tazobactam (ZOSYN) IVPB 3.375 g        3.375 g 12.5 mL/hr over 240 Minutes Intravenous  Once 01/27/23 1321 01/27/23 1715        Assessment/Plan: s/p Procedure(s): LAPAROSCOPY DIAGNOSTIC WITH WASHOUT AND DRAIN PLACEMENT (N/A)   Drainage up and down.  Pain issues Unfortunately, GI unable to assist with transgastric ERCP in the coming weeks, so this procedure would have to be performed at another hospital.  I think an ERCP would provide therapeutic and diagnostic benefits, but does open her up to additional risks.  There is a chance this leak would be controlled by the drains and she could leave the hospital and it could heal without ERCP.  Still early in this process.  Will take a while.  Patient unsure if she wants to take additional risks and go through the ERCP, or continue to wait to see if she improves.  Would [facility] endoscopist attempt laparoscopic assisted trans abdominal ERCP via the remnant?  ERCP by mouth attempting to traverse roux limb and hepatobiliary limb?  ERCP via axios stent between pouch and remnant?  Each of these have different levels of risk, I would rather not put her through a gastrogastric stent if that can be avoided.  Still on antibiotics - would continue until pain improves/bile drainage more controlled   PICC/TPN started 3/16 Pain control, frequent dressing changes, frequent JP emptying   Appreciate GI team assistance in the care of this patient.   Overall stable  LOS: 9 days    Felicie Morn MD 02/05/2023

## 2023-02-05 NOTE — Progress Notes (Signed)
Nutrition Follow-up  DOCUMENTATION CODES:   Severe malnutrition in context of acute illness/injury  INTERVENTION:   -Ensure MAX Protein po TID, each supplement provides 150 kcal and 30 grams of protein   -TPN management per Pharmacy -Continue Thiamine supplementation  -Daily weights while on TPN  NUTRITION DIAGNOSIS:   Severe Malnutrition related to poor appetite, acute illness, nausea, altered GI function, post-op healing as evidenced by energy intake < or equal to 50% for > or equal to 5 days, moderate fat depletion, moderate muscle depletion.  Ongoing.  GOAL:   Patient will meet greater than or equal to 90% of their needs  Progressing.  MONITOR:   PO intake, Labs, Supplement acceptance, Weight trends, Diet advancement  ASSESSMENT:   47 y.o. 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 Tolerating. Accepting around 2 Ensure Max daily.  Per GI, pursuing possible transfer to [facility] biliary department for ERCP.  TPN continues at 80 ml/hr, providing 1728 kcals and 99g protein.   Admission weight: 196 lbs Recommend daily weights while on TPN  Medications: Reglan, Miralax, Dulcolax  Labs reviewed: CBGs: 116-137   Diet Order:   Diet Order             Diet bariatric full liquid Room service appropriate? Yes; Fluid consistency: Thin  Diet effective now                   EDUCATION NEEDS:   Education needs have been addressed  Skin:  Skin Assessment: Reviewed RN Assessment  Last BM:  3/14 - type 4  Height:   Ht Readings from Last 1 Encounters:  02/01/23 5\' 8"  (1.727 m)    Weight:   Wt Readings from Last 1 Encounters:  02/01/23 89 kg    BMI:  Body  mass index is 29.83 kg/m.  Estimated Nutritional Needs:   Kcal:  1600-1800 kcal/day  Protein:  95-105 g protein/day  Fluid:  >/= 2L   Clayton Bibles, MS, RD, LDN Inpatient Clinical Dietitian Contact information available via Amion

## 2023-02-05 NOTE — Progress Notes (Signed)
Subjective: Patient states her abdominal pain is present but fairly well controlled on pain medications. She is very encouraged that output from JP drains have reduced. She complains of burning micturition and is concerned she may have a urinary tract infection. She has been moving around, has had few small pellet-like stools with suppositories and is able to tolerate protein shakes.  Objective: Vital signs in last 24 hours: Temp:  [98.2 F (36.8 C)-98.7 F (37.1 C)] 98.4 F (36.9 C) (03/18 0938) Pulse Rate:  [87-100] 92 (03/18 0938) Resp:  [11-18] 16 (03/18 0938) BP: (100-120)/(64-79) 119/79 (03/18 0938) SpO2:  [94 %-98 %] 96 % (03/18 0938) Weight change:  Last BM Date : 02/01/23  PE: Tearful GENERAL: Not in distress  ABDOMEN: Soft, mild generalized tenderness, 2 JP drains on right side, 1 JP drain on left side(appears to have been recently emptied but contains small amount of bilious fluid in all) EXTREMITIES: No edema  Lab Results: Results for orders placed or performed during the hospital encounter of 01/27/23 (from the past 48 hour(s))  Glucose, capillary     Status: Abnormal   Collection Time: 02/03/23 11:43 AM  Result Value Ref Range   Glucose-Capillary 145 (H) 70 - 99 mg/dL    Comment: Glucose reference range applies only to samples taken after fasting for at least 8 hours.  Glucose, capillary     Status: Abnormal   Collection Time: 02/03/23  5:40 PM  Result Value Ref Range   Glucose-Capillary 128 (H) 70 - 99 mg/dL    Comment: Glucose reference range applies only to samples taken after fasting for at least 8 hours.  Glucose, capillary     Status: Abnormal   Collection Time: 02/03/23 11:30 PM  Result Value Ref Range   Glucose-Capillary 135 (H) 70 - 99 mg/dL    Comment: Glucose reference range applies only to samples taken after fasting for at least 8 hours.  Basic metabolic panel     Status: Abnormal   Collection Time: 02/04/23  2:53 AM  Result Value Ref Range    Sodium 137 135 - 145 mmol/L   Potassium 3.7 3.5 - 5.1 mmol/L   Chloride 103 98 - 111 mmol/L   CO2 26 22 - 32 mmol/L   Glucose, Bld 124 (H) 70 - 99 mg/dL    Comment: Glucose reference range applies only to samples taken after fasting for at least 8 hours.   BUN 9 6 - 20 mg/dL   Creatinine, Ser 0.57 0.44 - 1.00 mg/dL   Calcium 8.3 (L) 8.9 - 10.3 mg/dL   GFR, Estimated >60 >60 mL/min    Comment: (NOTE) Calculated using the CKD-EPI Creatinine Equation (2021)    Anion gap 8 5 - 15    Comment: Performed at [facility], [place] [address], [place], [place] [address]  CBC     Status: Abnormal   Collection Time: 02/04/23  2:53 AM  Result Value Ref Range   WBC 9.9 4.0 - 10.5 K/uL   RBC 3.77 (L) 3.87 - 5.11 MIL/uL   Hemoglobin 10.3 (L) 12.0 - 15.0 g/dL   HCT 32.7 (L) 36.0 - 46.0 %   MCV 86.7 80.0 - 100.0 fL   MCH 27.3 26.0 - 34.0 pg   MCHC 31.5 30.0 - 36.0 g/dL   RDW 14.5 11.5 - 15.5 %   Platelets 479 (H) 150 - 400 K/uL   nRBC 0.0 0.0 - 0.2 %    Comment: Performed at [facility],  [place] [address] [place], [place] [address]  Hepatic function panel     Status: Abnormal   Collection Time: 02/04/23  2:53 AM  Result Value Ref Range   Total Protein 5.5 (L) 6.5 - 8.1 g/dL   Albumin 2.9 (L) 3.5 - 5.0 g/dL   AST 22 15 - 41 U/L   ALT 34 0 - 44 U/L   Alkaline Phosphatase 87 38 - 126 U/L   Total Bilirubin 0.7 0.3 - 1.2 mg/dL   Bilirubin, Direct 0.2 0.0 - 0.2 mg/dL   Indirect Bilirubin 0.5 0.3 - 0.9 mg/dL    Comment: Performed at [facility], [place] [address] [place], [place] [address]  Magnesium     Status: None   Collection Time: 02/04/23  2:53 AM  Result Value Ref Range   Magnesium 1.9 1.7 - 2.4 mg/dL    Comment: Performed at [facility], [place] [address] [place], [place] [address]  Phosphorus     Status: None   Collection Time: 02/04/23  2:53 AM  Result Value Ref Range   Phosphorus 3.7 2.5 - 4.6 mg/dL    Comment:  Performed at [facility], [place] [address], [place],  [address]  Glucose, capillary     Status: Abnormal   Collection Time: 02/04/23  6:02 AM  Result Value Ref Range   Glucose-Capillary 137 (H) 70 - 99 mg/dL    Comment: Glucose reference range applies only to samples taken after fasting for at least 8 hours.  Glucose, capillary     Status: Abnormal   Collection Time: 02/04/23 11:49 AM  Result Value Ref Range   Glucose-Capillary 126 (H) 70 - 99 mg/dL    Comment: Glucose reference range applies only to samples taken after fasting for at least 8 hours.  Glucose, capillary     Status: Abnormal   Collection Time: 02/04/23  5:34 PM  Result Value Ref Range   Glucose-Capillary 116 (H) 70 - 99 mg/dL    Comment: Glucose reference range applies only to samples taken after fasting for at least 8 hours.  Glucose, capillary     Status: Abnormal   Collection Time: 02/04/23 11:52 PM  Result Value Ref Range   Glucose-Capillary 136 (H) 70 - 99 mg/dL    Comment: Glucose reference range applies only to samples taken after fasting for at least 8 hours.  Comprehensive metabolic panel     Status: Abnormal   Collection Time: 02/05/23  2:49 AM  Result Value Ref Range   Sodium 136 135 - 145 mmol/L   Potassium 3.8 3.5 - 5.1 mmol/L   Chloride 102 98 - 111 mmol/L   CO2 25 22 - 32 mmol/L   Glucose, Bld 116 (H) 70 - 99 mg/dL    Comment: Glucose reference range applies only to samples taken after fasting for at least 8 hours.   BUN 11 6 - 20 mg/dL   Creatinine, Ser 0.57 0.44 - 1.00 mg/dL   Calcium 8.4 (L) 8.9 - 10.3 mg/dL   Total Protein 6.0 (L) 6.5 - 8.1 g/dL   Albumin 3.1 (L) 3.5 - 5.0 g/dL   AST 25 15 - 41 U/L   ALT 36 0 - 44 U/L   Alkaline Phosphatase 92 38 - 126 U/L   Total Bilirubin 0.7 0.3 - 1.2 mg/dL   GFR, Estimated >60 >60 mL/min    Comment: (NOTE) Calculated using the CKD-EPI Creatinine Equation (2021)    Anion gap 9 5 - 15  Comment: Performed at [facility], [place] [address] [place], [place] [address]  Magnesium     Status: None   Collection Time: 02/05/23  2:49 AM  Result Value Ref Range   Magnesium 2.0 1.7 - 2.4 mg/dL    Comment: Performed at [facility], [place] [address], [place], [place] [address]  Phosphorus     Status: None   Collection Time: 02/05/23  2:49 AM  Result Value Ref Range   Phosphorus 3.9 2.5 - 4.6 mg/dL    Comment: Performed at [facility], [place] [address] [place], [place] [address]  Triglycerides     Status: None   Collection Time: 02/05/23  2:49 AM  Result Value Ref Range   Triglycerides 109 <150 mg/dL    Comment: Performed at [facility], [place] [address] [place], [place] [address]  CBC     Status: Abnormal   Collection Time: 02/05/23  2:49 AM  Result Value Ref Range   WBC 9.2 4.0 - 10.5 K/uL   RBC 3.79 (L) 3.87 - 5.11 MIL/uL   Hemoglobin 10.5 (L) 12.0 - 15.0 g/dL   HCT 33.1 (L) 36.0 - 46.0 %   MCV 87.3 80.0 - 100.0 fL   MCH 27.7 26.0 - 34.0 pg   MCHC 31.7 30.0 - 36.0 g/dL   RDW 14.4 11.5 - 15.5 %   Platelets 485 (H) 150 - 400 K/uL   nRBC 0.0 0.0 - 0.2 %    Comment: Performed at [facility], [place] [address] [place], [place] [address]  Glucose, capillary     Status: Abnormal   Collection Time: 02/05/23  6:02 AM  Result Value Ref Range   Glucose-Capillary 134 (H) 70 - 99 mg/dL    Comment: Glucose reference range applies only to samples taken after fasting for at least 8 hours.    Studies/Results: No results found.  Medications: I have reviewed the patient's current medications.  Assessment: Bile leak, admitted on 01/27/2023, unable to be drained by IR, underwent diagnostic laparoscopy with abdominal washout and 3 JP drains placement  Drain output 500 cc in the last 24 hours (Prior to that was 495, 1230, 305, 1005, 1750 and 1150 ml each 24 hours)    Cholecystectomy with intraoperative cholangiogram on  01/25/2023 History of gastric bypass  Low total protein of 6 and low albumin 3.1, otherwise unremarkable CMP Normal WBC, normocytic anemia, hemoglobin 10.5, thrombocytosis, likely reactive  Plan: I have reached out to [facility] biliary department and left a message to call me back to discuss day trip procedure for ERCP and stent placement for bile leak in a patient with Roux-en-Y gastric bypass anatomy. Awaiting callback.  Patient and her husband are encouraged that JP drain output has decreased, they are hopeful that it will decrease even further in the next 1 to 2 days and she may not need an ERCP with stenting.  On the other hand, if JP drain bile output continues to remain steady or increase, they are okay with day trip procedure transfer for ERCP and stenting.  Ronnette Juniper, MD 02/05/2023, 10:10 AM

## 2023-02-06 LAB — CBC
HCT: 28.5 % — ABNORMAL LOW (ref 36.0–46.0)
Hemoglobin: 8.9 g/dL — ABNORMAL LOW (ref 12.0–15.0)
MCH: 27.1 pg (ref 26.0–34.0)
MCHC: 31.2 g/dL (ref 30.0–36.0)
MCV: 86.9 fL (ref 80.0–100.0)
Platelets: 410 10*3/uL — ABNORMAL HIGH (ref 150–400)
RBC: 3.28 MIL/uL — ABNORMAL LOW (ref 3.87–5.11)
RDW: 14.5 % (ref 11.5–15.5)
WBC: 8.2 10*3/uL (ref 4.0–10.5)
nRBC: 0 % (ref 0.0–0.2)

## 2023-02-06 LAB — BASIC METABOLIC PANEL
Anion gap: 9 (ref 5–15)
BUN: 12 mg/dL (ref 6–20)
CO2: 25 mmol/L (ref 22–32)
Calcium: 8.1 mg/dL — ABNORMAL LOW (ref 8.9–10.3)
Chloride: 104 mmol/L (ref 98–111)
Creatinine, Ser: 0.56 mg/dL (ref 0.44–1.00)
GFR, Estimated: >60 mL/min (ref >60)
Glucose, Bld: 112 mg/dL — ABNORMAL HIGH (ref 70–99)
Potassium: 3.5 mmol/L (ref 3.5–5.1)
Sodium: 138 mmol/L (ref 135–145)

## 2023-02-06 LAB — GLUCOSE, CAPILLARY
Glucose-Capillary: 118 mg/dL — ABNORMAL HIGH (ref 70–99)
Glucose-Capillary: 114 mg/dL — ABNORMAL HIGH (ref 70–99)

## 2023-02-06 LAB — HEPATIC FUNCTION PANEL
ALT: 39 U/L (ref 0–44)
AST: 24 U/L (ref 15–41)
Albumin: 2.6 g/dL — ABNORMAL LOW (ref 3.5–5.0)
Alkaline Phosphatase: 93 U/L (ref 38–126)
Bilirubin, Direct: 0.2 mg/dL (ref 0.0–0.2)
Indirect Bilirubin: 0.1 mg/dL — ABNORMAL LOW (ref 0.3–0.9)
Total Bilirubin: 0.3 mg/dL (ref 0.3–1.2)
Total Protein: 5.5 g/dL — ABNORMAL LOW (ref 6.5–8.1)

## 2023-02-06 MED ORDER — HYDROMORPHONE HCL 1 MG/ML IJ SOLN: 0.5000 mg | INTRAMUSCULAR | Status: DC | PRN

## 2023-02-06 MED ORDER — POTASSIUM CHLORIDE CRYS ER 20 MEQ PO TBCR
40.0000 meq | EXTENDED_RELEASE_TABLET | Freq: Once | ORAL | Status: AC
  Administered 2023-02-06: 40 meq via ORAL
  Filled 2023-02-06: qty 2

## 2023-02-06 MED ORDER — ADULT MULTIVITAMIN W/MINERALS CH
1.0000 | ORAL_TABLET | Freq: Every day | ORAL | Status: DC
  Administered 2023-02-07: 1 via ORAL
  Filled 2023-02-06: qty 1

## 2023-02-06 MED ORDER — OXYCODONE HCL 5 MG PO TABS: 5.0000 mg | ORAL_TABLET | ORAL | Status: DC | PRN

## 2023-02-06 MED ORDER — THIAMINE MONONITRATE 100 MG PO TABS
100.0000 mg | ORAL_TABLET | Freq: Every day | ORAL | Status: DC
  Administered 2023-02-07: 100 mg via ORAL
  Filled 2023-02-06: qty 1

## 2023-02-06 MED ORDER — OXYCODONE HCL 5 MG PO TABS: 10.0000 mg | ORAL_TABLET | ORAL | Status: DC | PRN

## 2023-02-06 MED ORDER — TRAVASOL 10 % IV SOLN
INTRAVENOUS | Status: DC
  Filled 2023-02-06: qty 499.2

## 2023-02-06 NOTE — Progress Notes (Signed)
8 Days Post-Op   Subjective/Chief Complaint: Walked a little too far yesterday and over did it.  Feeling overall better though. Drainage decreasing   Objective: Vital signs in last 24 hours: Temp:  [98.3 F (36.8 C)-99.6 F (37.6 C)] 99.6 F (37.6 C) (03/19 0516) Pulse Rate:  [84-92] 84 (03/19 0516) Resp:  [14-18] 18 (03/19 0516) BP: (97-120)/(63-79) 120/73 (03/19 0516) SpO2:  [94 %-98 %] 97 % (03/19 0516) Last BM Date : 02/05/23  Intake/Output from previous day: 03/18 0701 - 03/19 0700 In: 2067.9 [P.O.:600; I.V.:1328.4; IV Piggyback:139.5] Out: 3703.5 [Urine:3550; Drains:152.5; Stool:1] Intake/Output this shift: No intake/output data recorded.  Abdomen: Port sites intact.  3 drains working.  There is some drainage around all 3 drains which appears bilious.  There is no evidence of peritonitis.  Right abdominal wall softer this mroning  Lab Results:  Recent Labs    02/05/23 0249 02/06/23 0317  WBC 9.2 8.2  HGB 10.5* 8.9*  HCT 33.1* 28.5*  PLT 485* 410*    BMET Recent Labs    02/05/23 0249 02/06/23 0317  NA 136 138  K 3.8 3.5  CL 102 104  CO2 25 25  GLUCOSE 116* 112*  BUN 11 12  CREATININE 0.57 0.56  CALCIUM 8.4* 8.1*    PT/INR No results for input(s): "LABPROT", "INR" in the last 72 hours. ABG No results for input(s): "PHART", "HCO3" in the last 72 hours.  Invalid input(s): "PCO2", "PO2"  Studies/Results: No results found.  Anti-infectives: Anti-infectives (From admission, onward)    Start     Dose/Rate Route Frequency Ordered Stop   02/05/23 1115  fluconazole (DIFLUCAN) tablet 100 mg        100 mg Oral Daily 02/05/23 1025     02/03/23 1000  fluconazole (DIFLUCAN) tablet 150 mg        150 mg Oral Daily 02/03/23 0907 02/04/23 1102   01/29/23 1115  valACYclovir (VALTREX) tablet 500 mg        500 mg Oral Daily 01/29/23 1025     01/27/23 2200  piperacillin-tazobactam (ZOSYN) IVPB 3.375 g        3.375 g 12.5 mL/hr over 240 Minutes Intravenous  Every 8 hours 01/27/23 1822     01/27/23 1330  piperacillin-tazobactam (ZOSYN) IVPB 3.375 g        3.375 g 12.5 mL/hr over 240 Minutes Intravenous  Once 01/27/23 1321 01/27/23 1715       Assessment/Plan: s/p Procedure(s): LAPAROSCOPY DIAGNOSTIC WITH WASHOUT AND DRAIN PLACEMENT (N/A)   Drainage down today and pain and spirits improved.  I think an ERCP would provide therapeutic and diagnostic benefits, but does open her up to additional risks.  There is a chance this leak would be controlled by the drains and she could leave the hospital and it could heal without ERCP.  Still early in this process.  Will take a while.  Patient unsure if she wants to take additional risks and go through the ERCP, or continue to wait to see if she improves.  Would [facility] endoscopist attempt laparoscopic assisted trans abdominal ERCP via the remnant?  ERCP by mouth attempting to traverse roux limb and hepatobiliary limb?  ERCP via axios stent between pouch and remnant?  Each of these have different levels of risk, I would rather not put her through a gastrogastric stent if that can be avoided.  Stop zosyn today   PICC/TPN started 3/16 Pain control, frequent dressing changes, frequent JP emptying   Appreciate GI team assistance in  the care of this patient.  Try bariatric regular diet today, possibly home as early as tomorrow or thursday if drain output stays low, labs stable, activity improved, pain controlled.  Overall stable  LOS: 10 days    Felicie Morn MD 02/06/2023

## 2023-02-06 NOTE — Progress Notes (Addendum)
Subjective: Patient seen and examined at bedside in presence of her husband. Her PCA pump with Dilaudid has been removed, she states pain is better controlled. She walked for about 20 minutes yesterday and feels that it helped improve overall clinical state. She has been having small amount of flatus and bowel movement but denies abdominal pain. Her diet has been advanced to bariatric advance diet from today morning.  Objective: Vital signs in last 24 hours: Temp:  [98.3 F (36.8 C)-99.6 F (37.6 C)] 99.6 F (37.6 C) (03/19 0516) Pulse Rate:  [84-89] 84 (03/19 0516) Resp:  [14-18] 18 (03/19 0856) BP: (97-120)/(63-73) 120/73 (03/19 0516) SpO2:  [94 %-98 %] 97 % (03/19 0856) Weight change:  Last BM Date : 02/05/23  PE: More cheerful, not in distress, not in pain GENERAL: Mild pallor, no icterus ABDOMEN: Soft, 1 JP drain with minimal bilious output, 2 JP drains with small amount of bile and blood-tinged fluid, normoactive bowel sounds EXTREMITIES: No deformity  Lab Results: Results for orders placed or performed during the hospital encounter of 01/27/23 (from the past 48 hour(s))  Glucose, capillary     Status: Abnormal   Collection Time: 02/04/23  5:34 PM  Result Value Ref Range   Glucose-Capillary 116 (H) 70 - 99 mg/dL    Comment: Glucose reference range applies only to samples taken after fasting for at least 8 hours.  Glucose, capillary     Status: Abnormal   Collection Time: 02/04/23 11:52 PM  Result Value Ref Range   Glucose-Capillary 136 (H) 70 - 99 mg/dL    Comment: Glucose reference range applies only to samples taken after fasting for at least 8 hours.  Comprehensive metabolic panel     Status: Abnormal   Collection Time: 02/05/23  2:49 AM  Result Value Ref Range   Sodium 136 135 - 145 mmol/L   Potassium 3.8 3.5 - 5.1 mmol/L   Chloride 102 98 - 111 mmol/L   CO2 25 22 - 32 mmol/L   Glucose, Bld 116 (H) 70 - 99 mg/dL    Comment: Glucose reference range applies only  to samples taken after fasting for at least 8 hours.   BUN 11 6 - 20 mg/dL   Creatinine, Ser 0.57 0.44 - 1.00 mg/dL   Calcium 8.4 (L) 8.9 - 10.3 mg/dL   Total Protein 6.0 (L) 6.5 - 8.1 g/dL   Albumin 3.1 (L) 3.5 - 5.0 g/dL   AST 25 15 - 41 U/L   ALT 36 0 - 44 U/L   Alkaline Phosphatase 92 38 - 126 U/L   Total Bilirubin 0.7 0.3 - 1.2 mg/dL   GFR, Estimated >60 >60 mL/min    Comment: (NOTE) Calculated using the CKD-EPI Creatinine Equation (2021)    Anion gap 9 5 - 15    Comment: Performed at [facility], [place] [address] [place], [place] [address]  Magnesium     Status: None   Collection Time: 02/05/23  2:49 AM  Result Value Ref Range   Magnesium 2.0 1.7 - 2.4 mg/dL    Comment: Performed at [facility], [place] [address] [place], [place] [address]  Phosphorus     Status: None   Collection Time: 02/05/23  2:49 AM  Result Value Ref Range   Phosphorus 3.9 2.5 - 4.6 mg/dL    Comment: Performed at [facility], [place] [address] [place], [place] [address]  Triglycerides     Status: None   Collection Time:  02/05/23  2:49 AM  Result Value Ref Range   Triglycerides 109 <150 mg/dL    Comment: Performed at [facility], [place] [address] [place] [address], [place], [place] [address]  CBC     Status: Abnormal   Collection Time: 02/05/23  2:49 AM  Result Value Ref Range   WBC 9.2 4.0 - 10.5 K/uL   RBC 3.79 (L) 3.87 - 5.11 MIL/uL   Hemoglobin 10.5 (L) 12.0 - 15.0 g/dL   HCT 33.1 (L) 36.0 - 46.0 %   MCV 87.3 80.0 - 100.0 fL   MCH 27.7 26.0 - 34.0 pg   MCHC 31.7 30.0 - 36.0 g/dL   RDW 14.4 11.5 - 15.5 %   Platelets 485 (H) 150 - 400 K/uL   nRBC 0.0 0.0 - 0.2 %    Comment: Performed at [facility], [place] [address], [place], [place] [address]  Glucose, capillary     Status: Abnormal   Collection Time: 02/05/23  6:02 AM  Result Value Ref Range   Glucose-Capillary 134 (H) 70 - 99 mg/dL    Comment: Glucose reference  range applies only to samples taken after fasting for at least 8 hours.  Glucose, capillary     Status: Abnormal   Collection Time: 02/05/23 11:21 AM  Result Value Ref Range   Glucose-Capillary 122 (H) 70 - 99 mg/dL    Comment: Glucose reference range applies only to samples taken after fasting for at least 8 hours.  Urinalysis, Routine w reflex microscopic -Urine, Clean Catch     Status: Abnormal   Collection Time: 02/05/23  4:34 PM  Result Value Ref Range   Color, Urine STRAW (A) YELLOW   APPearance CLEAR CLEAR   Specific Gravity, Urine 1.012 1.005 - 1.030   pH 8.0 5.0 - 8.0   Glucose, UA NEGATIVE NEGATIVE mg/dL   Hgb urine dipstick SMALL (A) NEGATIVE   Bilirubin Urine NEGATIVE NEGATIVE   Ketones, ur NEGATIVE NEGATIVE mg/dL   Protein, ur NEGATIVE NEGATIVE mg/dL   Nitrite NEGATIVE NEGATIVE   Leukocytes,Ua NEGATIVE NEGATIVE   RBC / HPF 0-5 0 - 5 RBC/hpf   WBC, UA 0-5 0 - 5 WBC/hpf   Bacteria, UA NONE SEEN NONE SEEN   Squamous Epithelial / HPF 0-5 0 - 5 /HPF    Comment: Performed at [facility], [place] [address] [place], [place] [address]  Glucose, capillary     Status: Abnormal   Collection Time: 02/05/23  5:29 PM  Result Value Ref Range   Glucose-Capillary 121 (H) 70 - 99 mg/dL    Comment: Glucose reference range applies only to samples taken after fasting for at least 8 hours.  Glucose, capillary     Status: Abnormal   Collection Time: 02/05/23 11:50 PM  Result Value Ref Range   Glucose-Capillary 127 (H) 70 - 99 mg/dL    Comment: Glucose reference range applies only to samples taken after fasting for at least 8 hours.  Basic metabolic panel     Status: Abnormal   Collection Time: 02/06/23  3:17 AM  Result Value Ref Range   Sodium 138 135 - 145 mmol/L   Potassium 3.5 3.5 - 5.1 mmol/L   Chloride 104 98 - 111 mmol/L   CO2 25 22 - 32 mmol/L   Glucose, Bld 112 (H) 70 - 99 mg/dL    Comment: Glucose reference range applies only to samples taken after fasting  for at least 8 hours.   BUN 12 6 -  20 mg/dL   Creatinine, Ser 0.56 0.44 - 1.00 mg/dL   Calcium 8.1 (L) 8.9 - 10.3 mg/dL   GFR, Estimated >60 >60 mL/min    Comment: (NOTE) Calculated using the CKD-EPI Creatinine Equation (2021)    Anion gap 9 5 - 15    Comment: Performed at [facility], [place] [address], [place], [place] [address]  CBC     Status: Abnormal   Collection Time: 02/06/23  3:17 AM  Result Value Ref Range   WBC 8.2 4.0 - 10.5 K/uL   RBC 3.28 (L) 3.87 - 5.11 MIL/uL   Hemoglobin 8.9 (L) 12.0 - 15.0 g/dL   HCT 28.5 (L) 36.0 - 46.0 %   MCV 86.9 80.0 - 100.0 fL   MCH 27.1 26.0 - 34.0 pg   MCHC 31.2 30.0 - 36.0 g/dL   RDW 14.5 11.5 - 15.5 %   Platelets 410 (H) 150 - 400 K/uL   nRBC 0.0 0.0 - 0.2 %    Comment: Performed at [facility], [place] [address] [place] [address], [place], [place] [address]  Hepatic function panel     Status: Abnormal   Collection Time: 02/06/23  3:17 AM  Result Value Ref Range   Total Protein 5.5 (L) 6.5 - 8.1 g/dL   Albumin 2.6 (L) 3.5 - 5.0 g/dL   AST 24 15 - 41 U/L   ALT 39 0 - 44 U/L   Alkaline Phosphatase 93 38 - 126 U/L   Total Bilirubin 0.3 0.3 - 1.2 mg/dL   Bilirubin, Direct 0.2 0.0 - 0.2 mg/dL   Indirect Bilirubin 0.1 (L) 0.3 - 0.9 mg/dL    Comment: Performed at [facility], [place] [address] [place], [place] [address]  Glucose, capillary     Status: Abnormal   Collection Time: 02/06/23  5:49 AM  Result Value Ref Range   Glucose-Capillary 118 (H) 70 - 99 mg/dL    Comment: Glucose reference range applies only to samples taken after fasting for at least 8 hours.  Glucose, capillary     Status: Abnormal   Collection Time: 02/06/23 12:07 PM  Result Value Ref Range   Glucose-Capillary 114 (H) 70 - 99 mg/dL    Comment: Glucose reference range applies only to samples taken after fasting for at least 8 hours.    Studies/Results: No results found.  Medications: I have reviewed the patient's current  medications.  Assessment: Bile leak, status post diagnostic laparoscopy with abdominal washout JP drain output progressively decreasing, 152 mL in last 24 hours(was 500 cc prior to that) Afebrile, hemodynamically stable   Plan: I discussed her case with [facility] biliary department, Kirstie Mirza, NP yesterday. After reviewing her case, Dr. Lysle Rubens is willing to perform a day trip procedure on Thursday at 7 AM. He recommends a EUS directed transgastric ERCP/internal edge procedure which involves placing axios stent from pouch to excluded stomach, which is normally done in 2 stages, 4 weeks apart.  He is willing to do it as a single-stage procedure, predicts that the procedure may take 60 to 90 minutes with the intention to return the patient back to [facility] long hospital.  I discussed this in detail with the patient and her husband and Dr. Thermon Leyland from surgery.  As patient seems to be stable clinically and more importantly there is improvement in biliary drainage, they would prefer conservative management, possibly discharge home tomorrow and monitoring JP drain output as an outpatient.  Hopefully, JP drain bile output  will eventually resolve.  If the JP drain bile output continues to remain steady or worsen as an outpatient, Dr. Thermon Leyland  prefers laparoscopic gastrostomy assisted ERCP which can be considered depending upon her clinical state on regular follow-up.  Ronnette Juniper, MD 02/06/2023, 1:11 PM

## 2023-02-06 NOTE — Progress Notes (Signed)
PHARMACY - TOTAL PARENTERAL NUTRITION CONSULT NOTE   Indication: Prolonged ileus  Patient Measurements: Actual body weight: 90.3 kg Height: 5'8"  Assessment: Pt is a 63 yoF who is s/p Roux-en-Y and appendectomy in September of 2023. Pt underwent elective lap chole on 3/7 then presented to the ED on 3/9 with abdominal pain. Pt found to have post-op biloma/bile leak and possible bile peritonitis. Pt underwent urgent laparoscopic diagnostic with washout and drain placement on 3/11. Pharmacy consulted to manage TPN on 3/14.   Recent Labs    02/04/23 0253 02/05/23 0249 02/06/23 0317  NA 137 136 138  K 3.7 3.8 3.5  CL 103 102 104  CO2 26 25 25   GLUCOSE 124* 116* 112*  BUN 9 11 12   CREATININE 0.57 0.57 0.56  CALCIUM 8.3* 8.4* 8.1*  PHOS 3.7 3.9  --   MG 1.9 2.0  --   ALBUMIN 2.9* 3.1* 2.6*  ALKPHOS 87 92 93  AST 22 25 24   ALT 34 36 39  BILITOT 0.7 0.7 0.3  TRIG  --  109  --       Glucose / Insulin: No history of DM.  DC SSI/CBGs 3/18 Electrolytes:  K 3.5; all WNL including CorrCa Renal: SCr and BUN  WNL.  No phos in TPN Hepatic: LFTs, T.bili, Alk Phos all WNL, Alb low Trig WNL Intake / Output; MIVF: I/O recorded as (-) 1636 mL including 152.5 mL drain output.  PO 600 ml UOP 3550 ml Last BM 3/17, has bowel regimen: reglan 10 IV q6h ATC & miralax bid -TPN @ 78ml/hr -NS @ 25ml/hr GI Imaging: -HIDA: Findings compatible with bile leak GI Surgeries / Procedures:  -08/07/22: Roux-en-Y, appendectomy -01/25/23: Lap chole with intraoperative cholangiogram -02/06/23: CT guided drain of biloma by IR (2-3 mL dark thin amber fluid) -01/29/23: Laparascopic diagnostic with washout and drain placement (3 JP drains)  Central access: PICC placed 3/14 TPN start date: 3/15. TPN supposed to start on 3/14 but there was an issue with the TPN filter so D10 hung in its place.  Nutritional Goals: Goal TPN rate is 80 mL/hr (provides 100 g of protein and 1728 kcals per day)  RD  Assessment: Estimated Needs Total Energy Estimated Needs: 1600-1800 kcal/day Total Protein Estimated Needs: 95-105 g protein/day Total Fluid Estimated Needs: >/= 2L  Current Nutrition:  Bariatric full liquid> bariatric advanced 3/19 Supplements: Ensure Max TID - none harted yesterday  Plan:  Now: Kdur 40 po x 1 dose At 1800: decrease TPN to 1/2 rate per Dr Thermon Leyland 40 mL/hr - provides 50% of needs Electrolytes in TPN:  no change for today Na 50 mEq/L  K 50 mEq/L Ca 5 mEq/L  Mg 5 mEq/L Phos 0 mmol/L Cl:Ac 1:1 MVI to PO, thiamine to PO- to start 3/20  NS @ 30 mL/hr or per MD Monitor TPN labs on Mon/Thurs and PRN Diet advanced to bariatric regular today> f/u tolerance & ability to wean TPN off   Eudelia Bunch, Pharm.D Use secure chat for questions 02/06/2023 7:17 AM

## 2023-02-07 LAB — CBC
HCT: 29.3 % — ABNORMAL LOW (ref 36.0–46.0)
Hemoglobin: 9.2 g/dL — ABNORMAL LOW (ref 12.0–15.0)
MCH: 27.4 pg (ref 26.0–34.0)
MCHC: 31.4 g/dL (ref 30.0–36.0)
MCV: 87.2 fL (ref 80.0–100.0)
Platelets: 417 10*3/uL — ABNORMAL HIGH (ref 150–400)
RBC: 3.36 MIL/uL — ABNORMAL LOW (ref 3.87–5.11)
RDW: 14.5 % (ref 11.5–15.5)
WBC: 8.1 10*3/uL (ref 4.0–10.5)
nRBC: 0 % (ref 0.0–0.2)

## 2023-02-07 LAB — HEPATIC FUNCTION PANEL
ALT: 34 U/L (ref 0–44)
AST: 17 U/L (ref 15–41)
Albumin: 2.8 g/dL — ABNORMAL LOW (ref 3.5–5.0)
Alkaline Phosphatase: 101 U/L (ref 38–126)
Bilirubin, Direct: 0.2 mg/dL (ref 0.0–0.2)
Indirect Bilirubin: 0.2 mg/dL — ABNORMAL LOW (ref 0.3–0.9)
Total Bilirubin: 0.4 mg/dL (ref 0.3–1.2)
Total Protein: 6.1 g/dL — ABNORMAL LOW (ref 6.5–8.1)

## 2023-02-07 LAB — BASIC METABOLIC PANEL
Anion gap: 7 (ref 5–15)
BUN: 12 mg/dL (ref 6–20)
CO2: 27 mmol/L (ref 22–32)
Calcium: 8.5 mg/dL — ABNORMAL LOW (ref 8.9–10.3)
Chloride: 104 mmol/L (ref 98–111)
Creatinine, Ser: 0.59 mg/dL (ref 0.44–1.00)
GFR, Estimated: >60 mL/min (ref >60)
Glucose, Bld: 107 mg/dL — ABNORMAL HIGH (ref 70–99)
Potassium: 3.7 mmol/L (ref 3.5–5.1)
Sodium: 138 mmol/L (ref 135–145)

## 2023-02-07 MED ORDER — METHOCARBAMOL 500 MG PO TABS: 750.0000 mg | ORAL_TABLET | Freq: Four times a day (QID) | ORAL | Status: DC | PRN

## 2023-02-07 MED ORDER — FLUCONAZOLE 100 MG PO TABS: 100.0000 mg | ORAL_TABLET | Freq: Every day | ORAL | 0 refills | Status: AC

## 2023-02-07 MED ORDER — METHOCARBAMOL 750 MG PO TABS: 750.0000 mg | ORAL_TABLET | Freq: Four times a day (QID) | ORAL | 0 refills | Status: AC | PRN

## 2023-02-07 MED ORDER — MAGNESIUM CITRATE PO SOLN
1.0000 | Freq: Once | ORAL | Status: AC
  Administered 2023-02-07: 1 via ORAL
  Filled 2023-02-07: qty 296

## 2023-02-07 MED ORDER — OXYCODONE-ACETAMINOPHEN 5-325 MG PO TABS: 1.0000 | ORAL_TABLET | ORAL | 0 refills | Status: DC | PRN

## 2023-02-07 NOTE — Discharge Summary (Signed)
Patient ID: Monica [name] [id number] y.o. Apr 17, 1976  01/27/2023  Discharge date and time: 02/07/2023  Admitting Physician: Crosby  Discharge Physician: Jefferson  Admission Diagnoses: Post-op pain [G89.18] Bile leak [K83.9] Right upper quadrant abdominal pain [R10.11] Patient Active Problem List   Diagnosis Date Noted   Protein-calorie malnutrition, severe 02/01/2023   Bile leak 01/27/2023   Morbid obesity with BMI of 40.0-44.9, adult ([place]) 08/07/2022   Perforated appendicitis 07/06/2022   Acute appendicitis with perforation, localized peritonitis, and abscess 07/06/2022   Pregnant and not yet delivered in third trimester 03/15/2020   S/P cesarean section 10/20/2017     Discharge Diagnoses:  Patient Active Problem List   Diagnosis Date Noted   Protein-calorie malnutrition, severe 02/01/2023   Bile leak 01/27/2023   Morbid obesity with BMI of 40.0-44.9, adult ([place]) 08/07/2022   Perforated appendicitis 07/06/2022   Acute appendicitis with perforation, localized peritonitis, and abscess 07/06/2022   Pregnant and not yet delivered in third trimester 03/15/2020   S/P cesarean section 10/20/2017    Operations: Procedure(s): [place] PLACEMENT  Admission Condition: poor  Discharged Condition: good  Indication for Admission: Bile leak after cholecystectomy  Hospital Course: Ms. Mecca presented with a bile leak after cholecystectomy.  IR was unable to place a drain.  She was taken for diagnostic laparoscopy with drain placement on 01/29/23.  She recovered slowly in the hospital and was discharged with all three drains in place with follow up with me in 1 week.  Consults: Gastroenterology  Significant Diagnostic Studies: CT scan, HIDA scan, IR CT with aspiration  Treatments: surgery: Surgery as above  Disposition: Home  Patient Instructions:  Allergies as of 02/07/2023   No Known Allergies       Medication List     TAKE these medications    calcium carbonate 500 MG chewable tablet Commonly known as: TUMS - dosed in mg elemental calcium Chew 1 tablet by mouth 3 (three) times daily.   CELEBRATE MULTI-COMPLETE 45 PO Take 2 tablets by mouth daily.   fluconazole 100 MG tablet Commonly known as: DIFLUCAN Take 1 tablet (100 mg total) by mouth daily. Start taking on: February 08, 2023   fluticasone 50 MCG/ACT nasal spray Commonly known as: FLONASE Place 1-2 sprays into both nostrils daily as needed for allergies.   gabapentin 100 MG capsule Commonly known as: NEURONTIN Take 2 capsules (200 mg total) by mouth every 12 (twelve) hours.   ketotifen 0.025 % ophthalmic solution Commonly known as: ZADITOR Place 1 drop into both eyes daily as needed (itchy eyes).   levocetirizine 5 MG tablet Commonly known as: XYZAL Take 5 mg by mouth every evening.   methocarbamol 750 MG tablet Commonly known as: Robaxin-750 Take 1 tablet (750 mg total) by mouth every 6 (six) hours as needed for muscle spasms.   ondansetron 4 MG disintegrating tablet Commonly known as: ZOFRAN-ODT Dissolve 1 tablet (4 mg total) by mouth every 6 (six) hours as needed for nausea or vomiting.   oxyCODONE-acetaminophen 5-325 MG tablet Commonly known as: Percocet Take 1 tablet by mouth every 4 (four) hours as needed for severe pain.   polyethylene glycol 17 g packet Commonly known as: MiraLax Take 17 g by mouth daily as needed for mild constipation.   valACYclovir 500 MG tablet Commonly known as: VALTREX Take 500 mg by mouth daily.   VITAMIN D-3 PO Take 1 capsule by mouth daily.        Activity:  no heavy lifting for 4 weeks Diet: Bariatric regular Wound Care: keep wound clean and dry  Follow-up:  With Dr. Thermon Leyland in 1 week.  Signed: Nickola Major Marry Kusch General, Bariatric, & Minimally Invasive [facility], [place]   02/07/2023, 1:14 PM

## 2023-02-07 NOTE — Progress Notes (Signed)
Discharge instructions discussed with patient, including JP drain management and care, verbalized agreement and understanding

## 2023-02-07 NOTE — Discharge Instructions (Signed)
 CHOLECYSTECTOMY POST OPERATIVE INSTRUCTIONS  Thinking Clearly  The anesthesia may cause you to feel different for 1 or 2 days. Do not drive, drink alcohol, or make any big decisions for at least 2 days.  Nutrition When you wake up, you will be able to drink small amounts of liquid. If you do not feel sick, you can slowly advance your diet to regular foods. Continue to drink lots of fluids, usually about 8 to 10 glasses per day. Eat a high-fiber diet so you don't strain during bowel movements. High-Fiber Foods Foods high in fiber include beans, bran cereals and whole-grain breads, peas, dried fruit (figs, apricots, and dates), raspberries, blackberries, strawberries, sweet corn, broccoli, baked potatoes with skin, plums, pears, apples, greens, and nuts. Activity Slowly increase your activity. Be sure to get up and walk every hour or so to prevent blood clots. No heavy lifting or strenuous activity for 4 weeks following surgery to prevent hernias at your incision sites It is normal to feel tired. You may need more sleep than usual.  Get your rest but make sure to get up and move around frequently to prevent blood clots and pneumonia.  Work and Return to School You can go back to work when you feel well enough. Discuss the timing with your surgeon. You can usually go back to school or work 1 week after an operation. If your work requires heavy lifting or strenuous activity you need to be placed on light duty for 4 weeks following surgery. You can return to gym class, sports or other physical activities 4 weeks after surgery.  Wound Care Always wash your hands before and after touching near your incision site. Do not soak in a bathtub until cleared at your follow up appointment. You may take a shower 24 hours after surgery. A small amount of drainage from the incision is normal. If the drainage is thick and yellow or the site is red, you may have an infection, so call your surgeon. If you  have a drain in one of your incisions, it will be taken out in office when the drainage stops. Steri-Strips will fall off in 7 to 10 days or they will be removed during your first office visit. If you have dermabond glue covering over the incision, allow the glue to flake off on its own. Avoid wearing tight or rough clothing. It may rub your incisions and make it harder for them to heal. Protect the new skin, especially from the sun. The sun can burn and cause darker scarring. Your scar will heal in about 4 to 6 weeks and will become softer and continue to fade over the next year.  The cosmetic appearance of the incisions will improve over the course of the first year after surgery. Sensation around your incision will return in a few weeks or months.  Bowel Movements After intestinal surgery, you may have loose watery stools for several days. If watery diarrhea lasts longer than 3 days, contact your surgeon. Pain medication (narcotics) can cause constipation. Increase the fiber in your diet with high-fiber foods if you are constipated. You can take an over the counter stool softener like Colace to avoid constipation.  Additional over the counter medications can also be used if Colace isn't sufficient (for example, Milk of Magnesia or Miralax).  Pain The amount of pain is different for each person. Some people need only 1 to 3 doses of pain control medication, while others need more. Take alternating doses of tylenol   and ibuprofen around the clock for the first five days following surgery.  This will provide a baseline of pain control and help with inflammation.  Take the narcotic pain medication in addition if needed for severe pain.  Contact Your Surgeon at [phone], if you have: Pain in your right upper abdomen like a gallbladder attack. Pain that will not go away Pain that gets worse A fever of more than 101F (38.3C) Repeated vomiting Swelling, redness, bleeding, or bad-smelling  drainage from your wound site Strong abdominal pain No bowel movement or unable to pass gas for 3 days Watery diarrhea lasting longer than 3 days  Pain Control The goal of pain control is to minimize pain, keep you moving and help you heal. Your surgical team will work with you on your pain plan. Most often a combination of therapies and medications are used to control your pain. You may also be given medication (local anesthetic) at the surgical site. This may help control your pain for several days. Extreme pain puts extra stress on your body at a time when your body needs to focus on healing. Do not wait until your pain has reached a level "10" or is unbearable before telling your doctor or nurse. It is much easier to control pain before it becomes severe. Following a laparoscopic procedure, pain is sometimes felt in the shoulder. This is due to the gas inserted into your abdomen during the procedure. Moving and walking helps to decrease the gas and the right shoulder pain.  Use the guide below for ways to manage your post-operative pain. Learn more by going to facs.org/safepaincontrol.  How Intense Is My Pain Common Therapies to Feel Better       I hardly notice my pain, and it does not interfere with my activities.  I notice my pain and it distracts me, but I can still do activities (sitting up, walking, standing).  Non-Medication Therapies  Ice (in a bag, applied over clothing at the surgical site), elevation, rest, meditation, massage, distraction (music, TV, play) walking and mild exercise Splinting the abdomen with pillows +  Non-Opioid Medications Acetaminophen (Tylenol) Non-steroidal anti-inflammatory drugs (NSAIDS) Aspirin, Ibuprofen (Motrin, Advil) Naproxen (Aleve) Take these as needed, when you feel pain. Both acetaminophen and NSAIDs help to decrease pain and swelling (inflammation).      My pain is hard to ignore and is more noticeable even when I rest.  My  pain interferes with my usual activities.  Non-Medication Therapies  +  Non-Opioid medications  Take on a regular schedule (around-the-clock) instead of as needed. (For example, Tylenol every 6 hours at 9:00 am, 3:00 pm, 9:00 pm, 3:00 am and Motrin every 6 hours at 12:00 am, 6:00 am, 12:00 pm, 6:00 pm)         I am focused on my pain, and I am not doing my daily activities.  I am groaning in pain, and I cannot sleep. I am unable to do anything.  My pain is as bad as it could be, and nothing else matters.  Non-Medication Therapies  +  Around-the-Clock Non-Opioid Medications  +  Short-acting opioids  Opioids should be used with other medications to manage severe pain. Opioids block pain and give a feeling of euphoria (feel high). Addiction, a serious side effect of opioids, is rare with short-term (a few days) use.  Examples of short-acting opioids include: Tramadol (Ultram), Hydrocodone (Norco, Vicodin), Hydromorphone (Dilaudid), Oxycodone (Oxycontin)     The above directions have been adapted from   the American College of [employer] Surgical Patient Education Program.  Please refer to the ACS website if needed: https://www.facs.org/-/media/files/education/patient-ed/cholesys.ashx.   Nhu Glasby, MD [facility] [address], [address], [place], [place]  [address] ?  [address] [address], [place], [place]   [address] [phone] ? [phone] ? FAX [phone] Web site: [email or web address]  

## 2023-02-08 ENCOUNTER — Other Ambulatory Visit: Payer: Self-pay

## 2023-02-08 ENCOUNTER — Emergency Department (HOSPITAL_COMMUNITY): Payer: Commercial Managed Care - PPO

## 2023-02-08 ENCOUNTER — Encounter (HOSPITAL_COMMUNITY): Payer: Self-pay

## 2023-02-08 ENCOUNTER — Observation Stay (HOSPITAL_COMMUNITY)
Admission: EM | Admit: 2023-02-08 | Discharge: 2023-02-11 | Disposition: A | Discharge status: Home / Self Care | Payer: Commercial Managed Care - PPO | Attending: Surgery | Admitting: Surgery

## 2023-02-08 DIAGNOSIS — Z9049 Acquired absence of other specified parts of digestive tract: Secondary | ICD-10-CM

## 2023-02-08 DIAGNOSIS — T889XXA Complication of surgical and medical care, unspecified, initial encounter: Secondary | ICD-10-CM | POA: Insufficient documentation

## 2023-02-08 DIAGNOSIS — K832 Perforation of bile duct: Secondary | ICD-10-CM | POA: Diagnosis not present

## 2023-02-08 DIAGNOSIS — D72829 Elevated white blood cell count, unspecified: Secondary | ICD-10-CM | POA: Diagnosis present

## 2023-02-08 DIAGNOSIS — K5900 Constipation, unspecified: Secondary | ICD-10-CM | POA: Diagnosis not present

## 2023-02-08 DIAGNOSIS — Z79899 Other long term (current) drug therapy: Secondary | ICD-10-CM | POA: Diagnosis not present

## 2023-02-08 DIAGNOSIS — K9189 Other postprocedural complications and disorders of digestive system: Secondary | ICD-10-CM | POA: Diagnosis present

## 2023-02-08 DIAGNOSIS — R109 Unspecified abdominal pain: Secondary | ICD-10-CM | POA: Diagnosis present

## 2023-02-08 DIAGNOSIS — Z9884 Bariatric surgery status: Secondary | ICD-10-CM

## 2023-02-08 DIAGNOSIS — K839 Disease of biliary tract, unspecified: Secondary | ICD-10-CM | POA: Diagnosis present

## 2023-02-08 DIAGNOSIS — G8918 Other acute postprocedural pain: Secondary | ICD-10-CM | POA: Diagnosis present

## 2023-02-08 DIAGNOSIS — R000 Tachycardia, unspecified: Secondary | ICD-10-CM | POA: Diagnosis present

## 2023-02-08 LAB — CBC WITH DIFFERENTIAL/PLATELET
Abs Immature Granulocytes: 0.04 10*3/uL (ref 0.00–0.07)
Basophils Absolute: 0.1 10*3/uL (ref 0.0–0.1)
Basophils Relative: 0 %
Eosinophils Absolute: 0 10*3/uL (ref 0.0–0.5)
Eosinophils Relative: 0 %
HCT: 36.4 % (ref 36.0–46.0)
Hemoglobin: 11.5 g/dL — ABNORMAL LOW (ref 12.0–15.0)
Immature Granulocytes: 0 %
Lymphocytes Relative: 7 %
Lymphs Abs: 0.9 10*3/uL (ref 0.7–4.0)
MCH: 27.3 pg (ref 26.0–34.0)
MCHC: 31.6 g/dL (ref 30.0–36.0)
MCV: 86.5 fL (ref 80.0–100.0)
Monocytes Absolute: 0.6 10*3/uL (ref 0.1–1.0)
Monocytes Relative: 4 %
Neutro Abs: 12.6 10*3/uL — ABNORMAL HIGH (ref 1.7–7.7)
Neutrophils Relative %: 89 %
Platelets: 577 10*3/uL — ABNORMAL HIGH (ref 150–400)
RBC: 4.21 MIL/uL (ref 3.87–5.11)
RDW: 14.3 % (ref 11.5–15.5)
WBC: 14.3 10*3/uL — ABNORMAL HIGH (ref 4.0–10.5)
nRBC: 0 % (ref 0.0–0.2)

## 2023-02-08 LAB — COMPREHENSIVE METABOLIC PANEL
ALT: 47 U/L — ABNORMAL HIGH (ref 0–44)
AST: 32 U/L (ref 15–41)
Albumin: 4 g/dL (ref 3.5–5.0)
Alkaline Phosphatase: 166 U/L — ABNORMAL HIGH (ref 38–126)
Anion gap: 12 (ref 5–15)
BUN: 15 mg/dL (ref 6–20)
CO2: 26 mmol/L (ref 22–32)
Calcium: 9.3 mg/dL (ref 8.9–10.3)
Chloride: 99 mmol/L (ref 98–111)
Creatinine, Ser: 0.66 mg/dL (ref 0.44–1.00)
GFR, Estimated: >60 mL/min (ref >60)
Glucose, Bld: 138 mg/dL — ABNORMAL HIGH (ref 70–99)
Potassium: 4 mmol/L (ref 3.5–5.1)
Sodium: 137 mmol/L (ref 135–145)
Total Bilirubin: 0.9 mg/dL (ref 0.3–1.2)
Total Protein: 8 g/dL (ref 6.5–8.1)

## 2023-02-08 LAB — I-STAT BETA HCG BLOOD, ED (MC, WL, AP ONLY): I-stat hCG, quantitative: <5 m[IU]/mL (ref <5)

## 2023-02-08 LAB — LIPASE, BLOOD: Lipase: 30 U/L (ref 11–51)

## 2023-02-08 MED ORDER — METHOCARBAMOL 1000 MG/10ML IJ SOLN
500.0000 mg | Freq: Four times a day (QID) | INTRAVENOUS | Status: DC | PRN
  Filled 2023-02-08 (×3): qty 5

## 2023-02-08 MED ORDER — SODIUM CHLORIDE 0.9 % IV BOLUS
1000.0000 mL | Freq: Once | INTRAVENOUS | Status: AC
  Administered 2023-02-08: 1000 mL via INTRAVENOUS

## 2023-02-08 MED ORDER — LACTATED RINGERS IV SOLN: INTRAVENOUS | Status: DC

## 2023-02-08 MED ORDER — LORATADINE 10 MG PO TABS
10.0000 mg | ORAL_TABLET | Freq: Every evening | ORAL | Status: DC
  Administered 2023-02-08 – 2023-02-10 (×3): 10 mg via ORAL
  Filled 2023-02-08 (×3): qty 1

## 2023-02-08 MED ORDER — FLUCONAZOLE 100 MG PO TABS
100.0000 mg | ORAL_TABLET | Freq: Every day | ORAL | Status: DC
  Administered 2023-02-09 – 2023-02-11 (×3): 100 mg via ORAL
  Filled 2023-02-08 (×3): qty 1

## 2023-02-08 MED ORDER — BISACODYL 10 MG RE SUPP
10.0000 mg | Freq: Once | RECTAL | Status: DC
  Filled 2023-02-08: qty 1

## 2023-02-08 MED ORDER — VALACYCLOVIR HCL 500 MG PO TABS
500.0000 mg | ORAL_TABLET | Freq: Every day | ORAL | Status: DC
  Administered 2023-02-08 – 2023-02-11 (×4): 500 mg via ORAL
  Filled 2023-02-08 (×4): qty 1

## 2023-02-08 MED ORDER — ACETAMINOPHEN 325 MG PO TABS
650.0000 mg | ORAL_TABLET | Freq: Four times a day (QID) | ORAL | Status: DC
  Administered 2023-02-08 – 2023-02-11 (×10): 650 mg via ORAL
  Filled 2023-02-08 (×10): qty 2

## 2023-02-08 MED ORDER — LEVOCETIRIZINE DIHYDROCHLORIDE 5 MG PO TABS: 5.0000 mg | ORAL_TABLET | Freq: Every evening | ORAL | Status: DC

## 2023-02-08 MED ORDER — HYDROMORPHONE HCL 1 MG/ML IJ SOLN
0.5000 mg | INTRAMUSCULAR | Status: DC | PRN
  Administered 2023-02-08 – 2023-02-09 (×3): 0.5 mg via INTRAVENOUS
  Filled 2023-02-08 (×3): qty 0.5

## 2023-02-08 MED ORDER — IOHEXOL 300 MG/ML  SOLN
100.0000 mL | Freq: Once | INTRAMUSCULAR | Status: AC | PRN
  Administered 2023-02-08: 100 mL via INTRAVENOUS

## 2023-02-08 MED ORDER — DOCUSATE SODIUM 283 MG RE ENEM
1.0000 | ENEMA | Freq: Once | RECTAL | Status: AC
  Administered 2023-02-08: 283 mg via RECTAL
  Filled 2023-02-08: qty 1

## 2023-02-08 MED ORDER — ONDANSETRON HCL 4 MG/2ML IJ SOLN
4.0000 mg | Freq: Four times a day (QID) | INTRAMUSCULAR | Status: DC | PRN
  Filled 2023-02-08: qty 2

## 2023-02-08 MED ORDER — POLYETHYLENE GLYCOL 3350 17 G PO PACK
17.0000 g | PACK | Freq: Two times a day (BID) | ORAL | Status: DC
  Administered 2023-02-08 – 2023-02-11 (×5): 17 g via ORAL
  Filled 2023-02-08 (×7): qty 1

## 2023-02-08 MED ORDER — ONDANSETRON HCL 4 MG/2ML IJ SOLN
4.0000 mg | Freq: Once | INTRAMUSCULAR | Status: AC
  Administered 2023-02-08: 4 mg via INTRAVENOUS
  Filled 2023-02-08: qty 2

## 2023-02-08 MED ORDER — HYDROMORPHONE HCL 1 MG/ML IJ SOLN
1.0000 mg | INTRAMUSCULAR | Status: AC | PRN
  Administered 2023-02-08 (×3): 1 mg via INTRAVENOUS
  Filled 2023-02-08 (×3): qty 1

## 2023-02-08 MED ORDER — OXYCODONE HCL 5 MG PO TABS: 5.0000 mg | ORAL_TABLET | ORAL | Status: DC | PRN

## 2023-02-08 MED ORDER — SIMETHICONE 80 MG PO CHEW: 80.0000 mg | CHEWABLE_TABLET | Freq: Four times a day (QID) | ORAL | Status: DC | PRN

## 2023-02-08 MED ORDER — DOCUSATE SODIUM 283 MG RE ENEM
1.0000 | ENEMA | Freq: Once | RECTAL | Status: AC
  Administered 2023-02-08: 283 mg via RECTAL
  Filled 2023-02-08 (×2): qty 1

## 2023-02-08 MED ORDER — FLEET ENEMA 7-19 GM/118ML RE ENEM: 1.0000 | ENEMA | Freq: Every day | RECTAL | Status: DC | PRN

## 2023-02-08 MED ORDER — DOCUSATE SODIUM 100 MG PO CAPS
100.0000 mg | ORAL_CAPSULE | Freq: Two times a day (BID) | ORAL | Status: DC
  Administered 2023-02-08 – 2023-02-11 (×7): 100 mg via ORAL
  Filled 2023-02-08 (×7): qty 1

## 2023-02-08 MED ORDER — PROCHLORPERAZINE EDISYLATE 10 MG/2ML IJ SOLN: 10.0000 mg | INTRAMUSCULAR | Status: DC | PRN

## 2023-02-08 MED ORDER — OXYCODONE HCL 5 MG PO TABS
10.0000 mg | ORAL_TABLET | ORAL | Status: DC | PRN
  Administered 2023-02-09 – 2023-02-11 (×6): 10 mg via ORAL
  Filled 2023-02-08 (×6): qty 2

## 2023-02-08 MED ORDER — ENOXAPARIN SODIUM 40 MG/0.4ML IJ SOSY
40.0000 mg | PREFILLED_SYRINGE | Freq: Every day | INTRAMUSCULAR | Status: DC
  Administered 2023-02-08 – 2023-02-10 (×3): 40 mg via SUBCUTANEOUS
  Filled 2023-02-08 (×3): qty 0.4

## 2023-02-08 MED ORDER — KETOTIFEN FUMARATE 0.035 % OP SOLN: 1.0000 [drp] | Freq: Every day | OPHTHALMIC | Status: DC | PRN

## 2023-02-08 MED ORDER — SODIUM CHLORIDE 0.9 % IV SOLN: INTRAVENOUS | Status: DC

## 2023-02-08 NOTE — ED Triage Notes (Signed)
Gallbladder sx two weeks ago. D/c yesterday. Reports still in a lot of pain, unrelieved by medications. Drains in place. Reports no changes noted with drainage color or amount.  Denies fever or any other symptoms

## 2023-02-08 NOTE — ED Notes (Signed)
Patient transported to CT 

## 2023-02-08 NOTE — H&P (Signed)
Subjective/Chief Complaint: Discharged home yesterday.  Contacted overnight with significant pain.  Returned to ER this morning.    Objective: Vital signs in last 24 hours: Temp:  [97.8 F (36.6 C)-98 F (36.7 C)] 98 F (36.7 C) (03/21 1334) Pulse Rate:  [101-119] 101 (03/21 1230) Resp:  [19-20] 19 (03/21 1230) BP: (136-150)/(92-101) 136/92 (03/21 1230) SpO2:  [95 %-97 %] 95 % (03/21 1230)    Intake/Output from previous day: No intake/output data recorded. Intake/Output this shift: Total I/O In: 1000 [IV Piggyback:1000] Out: -   Abdomen: Port sites intact.  3 drains working.  There is some drainage around all 3 drains with some bile  Lab Results:  Recent Labs    02/07/23 0424 02/08/23 1029  WBC 8.1 14.3*  HGB 9.2* 11.5*  HCT 29.3* 36.4  PLT 417* 577*   BMET Recent Labs    02/07/23 0424 02/08/23 0913  NA 138 137  K 3.7 4.0  CL 104 99  CO2 27 26  GLUCOSE 107* 138*  BUN 12 15  CREATININE 0.59 0.66  CALCIUM 8.5* 9.3   PT/INR No results for input(s): "LABPROT", "INR" in the last 72 hours. ABG No results for input(s): "PHART", "HCO3" in the last 72 hours.  Invalid input(s): "PCO2", "PO2"  Studies/Results: CT ABDOMEN PELVIS W CONTRAST  Result Date: 02/08/2023 CLINICAL DATA:  Postoperative pain status post cholecystectomy. EXAM: CT ABDOMEN AND PELVIS WITH CONTRAST TECHNIQUE: Multidetector CT imaging of the abdomen and pelvis was performed using the standard protocol following bolus administration of intravenous contrast. RADIATION DOSE REDUCTION: This exam was performed according to the departmental dose-optimization program which includes automated exposure control, adjustment of the mA and/or kV according to patient size and/or use of iterative reconstruction technique. CONTRAST:  152mL OMNIPAQUE IOHEXOL 300 MG/ML  SOLN COMPARISON:  January 27, 2023. FINDINGS: Lower chest: Mild bibasilar subsegmental atelectasis is noted. Hepatobiliary: Status post  cholecystectomy. Mild intrahepatic and extrahepatic biliary dilatation is noted most likely due to post cholecystectomy status. There is continued fluid present within the gallbladder fossa. Fluid is also noted in the region of the falciform ligament. There has been interval placement of surgical drain with tip lateral to the right hepatic lobe. The amount of perihepatic fluid noted on prior exam is significantly decreased. Another surgical drain is seen passing underneath the left hepatic lobe with distal tip in the left upper quadrant. Minimal residual fluid is noted in this area. Pancreas: Unremarkable. No pancreatic ductal dilatation or surrounding inflammatory changes. Spleen: Normal in size without focal abnormality. Adrenals/Urinary Tract: Stable left adrenal nodule. Right adrenal gland is unremarkable. Kidneys are normal, without renal calculi, focal lesion, or hydronephrosis. Bladder is unremarkable. Stomach/Bowel: Status post gastric bypass. There is no evidence of bowel obstruction. Large amount of stool is present within the right colon. Status post appendectomy. Vascular/Lymphatic: No significant vascular findings are present. No enlarged abdominal or pelvic lymph nodes. Reproductive: Uterus and bilateral adnexa are unremarkable. Other: There is been interval placement of another surgical drain through the left lower quadrant with distal tip in the posterior portion of the deep pelvis. There is a significantly decreased amount of free fluid seen within the pelvis. No significant hernia is noted. Musculoskeletal: No acute or significant osseous findings. IMPRESSION: Status post cholecystectomy. Residual fluid remains within the gallbladder fossa. There has been interval placement of 2 surgical drains in the right upper quadrant, 1 with the tip lateral to right hepatic lobe superiorly and the other seen passing beneath the inferior portion of  left hepatic lobe with distal tip in left upper quadrant. There  is a significantly decreased amount of perihepatic fluid compared to prior exam. There also has been interval placement of surgical drain into the left lower quadrant with distal tip seen in the deep pelvis posteriorly. There is a significantly decreased amount of free fluid in the pelvis compared to prior exam. Status post gastric bypass. Large amount of stool is seen within the right colon. Electronically Signed   By: Marijo Conception M.D.   On: 02/08/2023 11:36    Anti-infectives: Anti-infectives (From admission, onward)    None       Assessment/Plan: Ms. Manygoats presented 01/27/23, with a bile leak after cholecystectomy on 01/25/23. She has a history of a gastric bypass.  IR was unable to place a drain. She was taken for diagnostic laparoscopy with drain placement on 01/29/23. She recovered slowly in the hospital and was discharged with all three drains in place with follow up with me in 1 week. She returned to the hospital the day after discharge with severe right colon constipation on CT.     IVF Aggressive bowel regimen - attempt to stimulate bowels from below to avoid additional right colonic distention. Monitor JP drain output - plan to allow leak to heal on its own to avoid additoinal risk and morbidity related to ERCP in gastric bypass patient Pain control, frequent dressing changes, frequent JP emptying Bariatric clear liquid diet for now  Home once tolerating diet and having bowel function    Overall stable  LOS: 0 days    Felicie Morn MD 02/08/2023

## 2023-02-08 NOTE — ED Provider Notes (Signed)
[place] EMERGENCY DEPARTMENT AT [facility] Provider Note   CSN: [id number] Arrival date & time: 02/08/23  0818     History  Chief Complaint  Patient presents with   Abdominal Pain    Gallbladder sx two weeks ago. D/c yesterday. Reports still in a lot of pain, unrelieved by medications.     Monica [name] is a 47 y.o. female.  HPI Patient presents 1 day after being discharged from our facility.  She is here with her husband who assists with the history.  Patient has a history of cholecystectomy, has bilateral JP drains in place, notes that on discharge she had mild pain, but over the past 24 hours has developed severe pain diffusely with persistent nausea, no relief with medications provided on discharge including narcotics and antiemetics. History is notable for recent cholecystectomy as above, gastric bypass end of last year, and prior appendicitis.    Home Medications Prior to Admission medications   Medication Sig Start Date End Date Taking? Authorizing Provider  calcium carbonate (TUMS - DOSED IN MG ELEMENTAL CALCIUM) 500 MG chewable tablet Chew 1 tablet by mouth 3 (three) times daily.   Yes [provider]  Cholecalciferol (VITAMIN D-3 PO) Take 1 capsule by mouth daily.   Yes [provider]  fluconazole (DIFLUCAN) 100 MG tablet Take 1 tablet (100 mg total) by mouth daily. 02/08/23  Yes Stechschulte, Nickola Major, MD  fluticasone (FLONASE) 50 MCG/ACT nasal spray Place 1-2 sprays into both nostrils daily as needed for allergies.   Yes [provider]  ketotifen (ZADITOR) 0.025 % ophthalmic solution Place 1 drop into both eyes daily as needed (itchy eyes).   Yes [provider]  levocetirizine (XYZAL) 5 MG tablet Take 5 mg by mouth every evening.   Yes [provider]  methocarbamol (ROBAXIN-750) 750 MG tablet Take 1 tablet (750 mg total) by mouth every 6 (six) hours as needed for muscle spasms. 02/07/23  Yes Stechschulte,  Nickola Major, MD  Multiple Vitamins-Minerals (CELEBRATE MULTI-COMPLETE 45 PO) Take 2 tablets by mouth daily.   Yes [provider]  ondansetron (ZOFRAN-ODT) 4 MG disintegrating tablet Dissolve 1 tablet (4 mg total) by mouth every 6 (six) hours as needed for nausea or vomiting. 08/09/22  Yes Stechschulte, Nickola Major, MD  oxyCODONE-acetaminophen (PERCOCET) 5-325 MG tablet Take 1 tablet by mouth every 4 (four) hours as needed for severe pain. 02/07/23 02/07/24 Yes Stechschulte, Nickola Major, MD  polyethylene glycol (MIRALAX) 17 g packet Take 17 g by mouth daily as needed for mild constipation. 07/10/22  Yes Meuth, Brooke A, PA-C  valACYclovir (VALTREX) 500 MG tablet Take 500 mg by mouth daily. 12/24/21  Yes [provider]  gabapentin (NEURONTIN) 100 MG capsule Take 2 capsules (200 mg total) by mouth every 12 (twelve) hours. Patient not taking: Reported on 01/28/2023 08/09/22   Stechschulte, Nickola Major, MD      Allergies    Patient has no known allergies.    Review of Systems   Review of Systems  All other systems reviewed and are negative.   Physical Exam Updated Vital Signs BP (!) 141/98   Pulse (!) 110   Temp 98 F (36.7 C) (Oral)   Resp 20   LMP 01/26/2023 Comment: negative pregnancy test  SpO2 94%  Physical Exam Vitals and nursing note reviewed.  Constitutional:      General: She is not in acute distress.    Appearance: She is well-developed.  HENT:  Head: Normocephalic and atraumatic.  Eyes:     Conjunctiva/sclera: Conjunctivae normal.  Cardiovascular:     Rate and Rhythm: Regular rhythm. Tachycardia present.  Pulmonary:     Effort: Pulmonary effort is normal. No respiratory distress.     Breath sounds: No stridor.  Abdominal:     General: There is no distension.     Tenderness: There is generalized abdominal tenderness. There is guarding.     Comments: Bilateral surgical drains 2 on the right, 1 on the left.  Drains each have fluid within them.  No obvious erythema or  purulence around any of the exit sites.  Skin:    General: Skin is warm and dry.  Neurological:     Mental Status: She is alert and oriented to person, place, and time.     Cranial Nerves: No cranial nerve deficit.  Psychiatric:        Mood and Affect: Mood normal.     ED Results / Procedures / Treatments   Labs (all labs ordered are listed, but only abnormal results are displayed) Labs Reviewed  COMPREHENSIVE METABOLIC PANEL - Abnormal; Notable for the following components:      Result Value   Glucose, Bld 138 (*)    ALT 47 (*)    Alkaline Phosphatase 166 (*)    All other components within normal limits  CBC WITH DIFFERENTIAL/PLATELET - Abnormal; Notable for the following components:   WBC 14.3 (*)    Hemoglobin 11.5 (*)    Platelets 577 (*)    Neutro Abs 12.6 (*)    All other components within normal limits  LIPASE, BLOOD  CBC WITH DIFFERENTIAL/PLATELET  I-STAT BETA HCG BLOOD, ED ([facility], [facility], [facility] ONLY)    EKG None  Radiology CT ABDOMEN PELVIS W CONTRAST  Result Date: 02/08/2023 CLINICAL DATA:  Postoperative pain status post cholecystectomy. EXAM: CT ABDOMEN AND PELVIS WITH CONTRAST TECHNIQUE: Multidetector CT imaging of the abdomen and pelvis was performed using the standard protocol following bolus administration of intravenous contrast. RADIATION DOSE REDUCTION: This exam was performed according to the departmental dose-optimization program which includes automated exposure control, adjustment of the mA and/or kV according to patient size and/or use of iterative reconstruction technique. CONTRAST:  151mL OMNIPAQUE IOHEXOL 300 MG/ML  SOLN COMPARISON:  January 27, 2023. FINDINGS: Lower chest: Mild bibasilar subsegmental atelectasis is noted. Hepatobiliary: Status post cholecystectomy. Mild intrahepatic and extrahepatic biliary dilatation is noted most likely due to post cholecystectomy status. There is continued fluid present within the gallbladder fossa. Fluid is also noted in the  region of the falciform ligament. There has been interval placement of surgical drain with tip lateral to the right hepatic lobe. The amount of perihepatic fluid noted on prior exam is significantly decreased. Another surgical drain is seen passing underneath the left hepatic lobe with distal tip in the left upper quadrant. Minimal residual fluid is noted in this area. Pancreas: Unremarkable. No pancreatic ductal dilatation or surrounding inflammatory changes. Spleen: Normal in size without focal abnormality. Adrenals/Urinary Tract: Stable left adrenal nodule. Right adrenal gland is unremarkable. Kidneys are normal, without renal calculi, focal lesion, or hydronephrosis. Bladder is unremarkable. Stomach/Bowel: Status post gastric bypass. There is no evidence of bowel obstruction. Large amount of stool is present within the right colon. Status post appendectomy. Vascular/Lymphatic: No significant vascular findings are present. No enlarged abdominal or pelvic lymph nodes. Reproductive: Uterus and bilateral adnexa are unremarkable. Other: There is been interval placement of another surgical drain through the left lower quadrant  with distal tip in the posterior portion of the deep pelvis. There is a significantly decreased amount of free fluid seen within the pelvis. No significant hernia is noted. Musculoskeletal: No acute or significant osseous findings. IMPRESSION: Status post cholecystectomy. Residual fluid remains within the gallbladder fossa. There has been interval placement of 2 surgical drains in the right upper quadrant, 1 with the tip lateral to right hepatic lobe superiorly and the other seen passing beneath the inferior portion of left hepatic lobe with distal tip in left upper quadrant. There is a significantly decreased amount of perihepatic fluid compared to prior exam. There also has been interval placement of surgical drain into the left lower quadrant with distal tip seen in the deep pelvis  posteriorly. There is a significantly decreased amount of free fluid in the pelvis compared to prior exam. Status post gastric bypass. Large amount of stool is seen within the right colon. Electronically Signed   By: Marijo Conception M.D.   On: 02/08/2023 11:36    Procedures Procedures    Medications Ordered in ED Medications  sodium chloride 0.9 % bolus 1,000 mL (0 mLs Intravenous Stopped 02/08/23 1136)    And  0.9 %  sodium chloride infusion (0 mLs Intravenous Stopped 02/08/23 1418)  bisacodyl (DULCOLAX) suppository 10 mg (10 mg Rectal Not Given 02/08/23 1306)  docusate sodium (ENEMEEZ) enema 283 mg (has no administration in time range)  enoxaparin (LOVENOX) injection 40 mg (has no administration in time range)  lactated ringers infusion (has no administration in time range)  acetaminophen (TYLENOL) tablet 650 mg (has no administration in time range)  oxyCODONE (Oxy IR/ROXICODONE) immediate release tablet 5 mg (has no administration in time range)  oxyCODONE (Oxy IR/ROXICODONE) immediate release tablet 10 mg (has no administration in time range)  HYDROmorphone (DILAUDID) injection 0.5 mg (0.5 mg Intravenous Given 02/08/23 1623)  methocarbamol (ROBAXIN) 500 mg in dextrose 5 % 50 mL IVPB (has no administration in time range)  ondansetron (ZOFRAN) injection 4 mg (has no administration in time range)  prochlorperazine (COMPAZINE) injection 10 mg (has no administration in time range)  simethicone (MYLICON) chewable tablet 80 mg (has no administration in time range)  docusate sodium (COLACE) capsule 100 mg (has no administration in time range)  sodium phosphate (FLEET) 7-19 GM/118ML enema 1 enema (has no administration in time range)  docusate sodium (ENEMEEZ) enema 283 mg (has no administration in time range)  polyethylene glycol (MIRALAX / GLYCOLAX) packet 17 g (has no administration in time range)  fluconazole (DIFLUCAN) tablet 100 mg (has no administration in time range)  ketotifen (ZADITOR)  0.035 % ophthalmic solution 1 drop (has no administration in time range)  valACYclovir (VALTREX) tablet 500 mg (has no administration in time range)  loratadine (CLARITIN) tablet 10 mg (has no administration in time range)  ondansetron (ZOFRAN) injection 4 mg (4 mg Intravenous Given 02/08/23 0916)  HYDROmorphone (DILAUDID) injection 1 mg (1 mg Intravenous Given 02/08/23 1242)  iohexol (OMNIPAQUE) 300 MG/ML solution 100 mL (100 mLs Intravenous Contrast Given 02/08/23 1037)    ED Course/ Medical Decision Making/ A&P                             Medical Decision Making Adult female with recent cholecystectomy, complicated with hematoma, drain placement, now presents 1 day after discharge with worsening pain, nausea.  Patient is tachycardic, but not hypotensive, or febrile on arrival.  Concern for infection versus postop pain.  CT ordered, IV Dilaudid, Zofran, fluids started. Patient placed on continuous monitoring. Cardiac 120 sinus tach abnormal Pulse ox 97% room air normal   Amount and/or Complexity of Data Reviewed Independent Historian: spouse External Data Reviewed: notes.    Details: Discharge summary from yesterday reviewed Labs: ordered. Decision-making details documented in ED Course. Radiology: ordered and independent interpretation performed. Decision-making details documented in ED Course.  Risk Prescription drug management. Decision regarding hospitalization.   Update: Patient continues to complain of pain.  I reviewed her CT discussed with our surgery colleagues.  Labs notable for worsening leukocytosis, though she is not hypotensive, she is tachycardic, and given his abnormalities, recent surgery, patient was admitted to our surgery team for further monitoring, management.        Final Clinical Impression(s) / ED Diagnoses Final diagnoses:  Post-operative pain  Leukocytosis, unspecified type    Rx / DC Orders ED Discharge Orders     None         Carmin Muskrat, MD 02/08/23 1625

## 2023-02-09 LAB — BASIC METABOLIC PANEL
Anion gap: 8 (ref 5–15)
BUN: 12 mg/dL (ref 6–20)
CO2: 27 mmol/L (ref 22–32)
Calcium: 8.6 mg/dL — ABNORMAL LOW (ref 8.9–10.3)
Chloride: 102 mmol/L (ref 98–111)
Creatinine, Ser: 0.63 mg/dL (ref 0.44–1.00)
GFR, Estimated: >60 mL/min (ref >60)
Glucose, Bld: 105 mg/dL — ABNORMAL HIGH (ref 70–99)
Potassium: 3.7 mmol/L (ref 3.5–5.1)
Sodium: 137 mmol/L (ref 135–145)

## 2023-02-09 LAB — CBC
HCT: 31.5 % — ABNORMAL LOW (ref 36.0–46.0)
Hemoglobin: 10 g/dL — ABNORMAL LOW (ref 12.0–15.0)
MCH: 27.3 pg (ref 26.0–34.0)
MCHC: 31.7 g/dL (ref 30.0–36.0)
MCV: 86.1 fL (ref 80.0–100.0)
Platelets: 516 10*3/uL — ABNORMAL HIGH (ref 150–400)
RBC: 3.66 MIL/uL — ABNORMAL LOW (ref 3.87–5.11)
RDW: 14.3 % (ref 11.5–15.5)
WBC: 9.5 10*3/uL (ref 4.0–10.5)
nRBC: 0 % (ref 0.0–0.2)

## 2023-02-09 MED ORDER — LACTULOSE 10 GM/15ML PO SOLN
30.0000 g | Freq: Two times a day (BID) | ORAL | Status: DC | PRN
  Administered 2023-02-09: 30 g via ORAL
  Filled 2023-02-09: qty 45

## 2023-02-09 MED ORDER — BOOST / RESOURCE BREEZE PO LIQD CUSTOM
1.0000 | Freq: Three times a day (TID) | ORAL | Status: DC
  Administered 2023-02-09 – 2023-02-11 (×4): 1 via ORAL

## 2023-02-09 MED ORDER — ENSURE MAX PROTEIN PO LIQD
11.0000 [oz_av] | Freq: Every day | ORAL | Status: DC
  Administered 2023-02-09 – 2023-02-11 (×3): 11 [oz_av] via ORAL

## 2023-02-09 NOTE — TOC CM/SW Note (Signed)
Transition of Care [facility]) Screening Note  Patient Details  Name: Monica [name] Date of Birth: 09-27-1976  Transition of Care [facility]) CM/SW Contact:    Sherie Don, LCSW Phone Number: 02/09/2023, 9:59 AM  Transition of Care Department [facility]) has reviewed patient and no TOC needs have been identified at this time. We will continue to monitor patient advancement through interdisciplinary progression rounds. If new patient transition needs arise, please place a TOC consult.

## 2023-02-09 NOTE — Progress Notes (Signed)
S: She is feeling significantly better today.  She has had 2 enemas, they were not especially productive but the second 1 did resulted in alleviation of a lot of gas.  She is hoping for more productive bowel movement today.  She is hungry and would like to advance her diet.  Does note significant drainage around her left lower quadrant drain site which is light green.  O: Vitals, labs, intake/output, and orders reviewed at this time.  Afebrile, heart rate 85-97, normotensive, saturating 96% on room air.  P.o. 670, uop 700 +2x, drain 1 (right "upper") 32 mL, drain 2 (right "lower") 58mL, drain 3 (left lower) 30 mL.  BMP unremarkable, WBC normalized to 9.5 (14.3 yesterday), hemoglobin 10 (11.5), it was 516 (577)  CT 3/21: Significant improvement in intra-abdominal and abdominal wall fluid collections, persistent fluid in the gallbladder fossa and fairly massive amount of stool in the cecum and ascending colon  Gen: A&Ox3, no distress  H&N: EOMI, atraumatic, neck supple Chest: unlabored respirations, RRR Abd: soft, mildly appropriately tender, nondistended, incision(s) c/d/I, no palpable or visible appreciable abdominal wall hematoma Ext: warm, no edema Neuro: grossly normal  Lines/tubes/drains:  PIV RUQ JP -1 looks almost completely serous, the other has a very slight bilious tinge in the tubing LLQ JP-fluid in tubing and bulb is serous, but drainage around the tube is very light bilious  A/P: 47 year old woman with history of gastric bypass who presented with a bile leak 2 days after an uncomplicated laparoscopic cholecystectomy with cholangiogram on 01/25/2023.   She is status post diagnostic laparoscopy 3/11 with placement of 3 drains; 1 over the dome of the liver, 1 along the inferior margin of the liver, and 1 in the pelvis (the left-sided drain).  Output was initially high but it has gradually significantly tapered down.  Only about 100 cc out of all 3 drains since readmission yesterday  afternoon but moderate amount of drainage around the left lower quadrant drain which has not been accounted for.    Bile Leak: seems to be closing down. Drain output is low volume and mostly serous/ some minimally bile tinged at this point Continue all surgical drains for now. Allow fluid in the GB fossa to remain and hopefully aid in tamponading the leak somewhat. Suspect the drainage around the left lower quadrant drain site will subside on its own.  Constipation: continue aggressive bowel regimen. Will add lactulose  -Advance diet and add protein shakes. Continue multimodal pain control, pulm toilet and aggressive mobilization.        Romana Juniper, MD [facility] Surgery, [place]

## 2023-02-10 LAB — CBC
HCT: 28.8 % — ABNORMAL LOW (ref 36.0–46.0)
Hemoglobin: 9.2 g/dL — ABNORMAL LOW (ref 12.0–15.0)
MCH: 27.4 pg (ref 26.0–34.0)
MCHC: 31.9 g/dL (ref 30.0–36.0)
MCV: 85.7 fL (ref 80.0–100.0)
Platelets: 501 10*3/uL — ABNORMAL HIGH (ref 150–400)
RBC: 3.36 MIL/uL — ABNORMAL LOW (ref 3.87–5.11)
RDW: 14.1 % (ref 11.5–15.5)
WBC: 7.2 10*3/uL (ref 4.0–10.5)
nRBC: 0 % (ref 0.0–0.2)

## 2023-02-10 LAB — BASIC METABOLIC PANEL
Anion gap: 9 (ref 5–15)
BUN: 8 mg/dL (ref 6–20)
CO2: 26 mmol/L (ref 22–32)
Calcium: 8.4 mg/dL — ABNORMAL LOW (ref 8.9–10.3)
Chloride: 104 mmol/L (ref 98–111)
Creatinine, Ser: 0.55 mg/dL (ref 0.44–1.00)
GFR, Estimated: >60 mL/min (ref >60)
Glucose, Bld: 96 mg/dL (ref 70–99)
Potassium: 3.2 mmol/L — ABNORMAL LOW (ref 3.5–5.1)
Sodium: 139 mmol/L (ref 135–145)

## 2023-02-10 NOTE — Progress Notes (Signed)
Subjective/Chief Complaint: Had several BM's Feeling more comfortable   Objective: Vital signs in last 24 hours: Temp:  [98.3 F (36.8 C)-99 F (37.2 C)] 99 F (37.2 C) (03/23 0501) Pulse Rate:  [81-91] 81 (03/23 0501) Resp:  [17-18] 17 (03/23 0501) BP: (109-136)/(73-86) 113/73 (03/23 0501) SpO2:  [95 %-97 %] 95 % (03/23 0501) Last BM Date : 02/09/23  Intake/Output from previous day: 03/22 0701 - 03/23 0700 In: 2541 [P.O.:180; I.V.:2361] Out: 75 [Drains:75] Intake/Output this shift: No intake/output data recorded.  Exam: Looks comfortable Abdomen soft, minimally tender, drains more serosang than bile  Lab Results:  Recent Labs    02/09/23 0519 02/10/23 0453  WBC 9.5 7.2  HGB 10.0* 9.2*  HCT 31.5* 28.8*  PLT 516* 501*   BMET Recent Labs    02/09/23 0519 02/10/23 0453  NA 137 139  K 3.7 3.2*  CL 102 104  CO2 27 26  GLUCOSE 105* 96  BUN 12 8  CREATININE 0.63 0.55  CALCIUM 8.6* 8.4*   PT/INR No results for input(s): "LABPROT", "INR" in the last 72 hours. ABG No results for input(s): "PHART", "HCO3" in the last 72 hours.  Invalid input(s): "PCO2", "PO2"  Studies/Results: CT ABDOMEN PELVIS W CONTRAST  Result Date: 02/08/2023 CLINICAL DATA:  Postoperative pain status post cholecystectomy. EXAM: CT ABDOMEN AND PELVIS WITH CONTRAST TECHNIQUE: Multidetector CT imaging of the abdomen and pelvis was performed using the standard protocol following bolus administration of intravenous contrast. RADIATION DOSE REDUCTION: This exam was performed according to the departmental dose-optimization program which includes automated exposure control, adjustment of the mA and/or kV according to patient size and/or use of iterative reconstruction technique. CONTRAST:  168mL OMNIPAQUE IOHEXOL 300 MG/ML  SOLN COMPARISON:  January 27, 2023. FINDINGS: Lower chest: Mild bibasilar subsegmental atelectasis is noted. Hepatobiliary: Status post cholecystectomy. Mild intrahepatic and  extrahepatic biliary dilatation is noted most likely due to post cholecystectomy status. There is continued fluid present within the gallbladder fossa. Fluid is also noted in the region of the falciform ligament. There has been interval placement of surgical drain with tip lateral to the right hepatic lobe. The amount of perihepatic fluid noted on prior exam is significantly decreased. Another surgical drain is seen passing underneath the left hepatic lobe with distal tip in the left upper quadrant. Minimal residual fluid is noted in this area. Pancreas: Unremarkable. No pancreatic ductal dilatation or surrounding inflammatory changes. Spleen: Normal in size without focal abnormality. Adrenals/Urinary Tract: Stable left adrenal nodule. Right adrenal gland is unremarkable. Kidneys are normal, without renal calculi, focal lesion, or hydronephrosis. Bladder is unremarkable. Stomach/Bowel: Status post gastric bypass. There is no evidence of bowel obstruction. Large amount of stool is present within the right colon. Status post appendectomy. Vascular/Lymphatic: No significant vascular findings are present. No enlarged abdominal or pelvic lymph nodes. Reproductive: Uterus and bilateral adnexa are unremarkable. Other: There is been interval placement of another surgical drain through the left lower quadrant with distal tip in the posterior portion of the deep pelvis. There is a significantly decreased amount of free fluid seen within the pelvis. No significant hernia is noted. Musculoskeletal: No acute or significant osseous findings. IMPRESSION: Status post cholecystectomy. Residual fluid remains within the gallbladder fossa. There has been interval placement of 2 surgical drains in the right upper quadrant, 1 with the tip lateral to right hepatic lobe superiorly and the other seen passing beneath the inferior portion of left hepatic lobe with distal tip in left upper quadrant. There is  a significantly decreased amount of  perihepatic fluid compared to prior exam. There also has been interval placement of surgical drain into the left lower quadrant with distal tip seen in the deep pelvis posteriorly. There is a significantly decreased amount of free fluid in the pelvis compared to prior exam. Status post gastric bypass. Large amount of stool is seen within the right colon. Electronically Signed   By: Marijo Conception M.D.   On: 02/08/2023 11:36    Anti-infectives: Anti-infectives (From admission, onward)    Start     Dose/Rate Route Frequency Ordered Stop   02/09/23 1000  fluconazole (DIFLUCAN) tablet 100 mg        100 mg Oral Daily 02/08/23 1353     02/08/23 1700  valACYclovir (VALTREX) tablet 500 mg        500 mg Oral Daily 02/08/23 1353         Assessment/Plan: 47 year old woman with history of gastric bypass who presented with a bile leak 2 days after an uncomplicated laparoscopic cholecystectomy with cholangiogram on 01/25/2023.   She is status post diagnostic laparoscopy 3/11 with placement of 3 drains; 1 over the dome of the liver, 1 along the inferior margin of the liver, and 1 in the pelvis (the left-sided drain).  Output was initially high but it has gradually significantly tapered down.   Drains now 75 cc last 24 hours Having BM's  Continue bowel regimen Home in next 24 to 48 hours   Monica [name] 02/10/2023

## 2023-02-11 LAB — BASIC METABOLIC PANEL
Anion gap: 8 (ref 5–15)
BUN: 7 mg/dL (ref 6–20)
CO2: 28 mmol/L (ref 22–32)
Calcium: 8.4 mg/dL — ABNORMAL LOW (ref 8.9–10.3)
Chloride: 104 mmol/L (ref 98–111)
Creatinine, Ser: 0.59 mg/dL (ref 0.44–1.00)
GFR, Estimated: >60 mL/min (ref >60)
Glucose, Bld: 99 mg/dL (ref 70–99)
Potassium: 3.4 mmol/L — ABNORMAL LOW (ref 3.5–5.1)
Sodium: 140 mmol/L (ref 135–145)

## 2023-02-11 LAB — CBC
HCT: 28.3 % — ABNORMAL LOW (ref 36.0–46.0)
Hemoglobin: 9 g/dL — ABNORMAL LOW (ref 12.0–15.0)
MCH: 27.4 pg (ref 26.0–34.0)
MCHC: 31.8 g/dL (ref 30.0–36.0)
MCV: 86.3 fL (ref 80.0–100.0)
Platelets: 519 10*3/uL — ABNORMAL HIGH (ref 150–400)
RBC: 3.28 MIL/uL — ABNORMAL LOW (ref 3.87–5.11)
RDW: 13.8 % (ref 11.5–15.5)
WBC: 6.1 10*3/uL (ref 4.0–10.5)
nRBC: 0 % (ref 0.0–0.2)

## 2023-02-11 LAB — TOTAL BILIRUBIN, BODY FLUID: Total bilirubin, fluid: 22.4 mg/dL

## 2023-02-11 MED ORDER — ONDANSETRON HCL 4 MG PO TABS: 4.0000 mg | ORAL_TABLET | Freq: Three times a day (TID) | ORAL | 1 refills | Status: AC | PRN

## 2023-02-11 MED ORDER — OXYCODONE-ACETAMINOPHEN 5-325 MG PO TABS: 1.0000 | ORAL_TABLET | Freq: Four times a day (QID) | ORAL | 0 refills | Status: AC | PRN

## 2023-02-11 NOTE — Progress Notes (Signed)
Patient ID: Monica [name], female   DOB: 27-Mar-1976, 47 y.o.   MRN: [id number]   Feeling better Drainage down to 65 cc last 24 hours Abdomen soft, drain sites clean   Plan: Discharge home with drains

## 2023-02-11 NOTE — Discharge Instructions (Addendum)
[facility] ______[facility] LAPAROSCOPIC SURGERY: POST OP INSTRUCTIONS Always review your discharge instruction sheet given to you by the facility where your surgery was performed. IF YOU HAVE DISABILITY OR FAMILY LEAVE FORMS, YOU MUST BRING THEM TO THE OFFICE FOR PROCESSING.   DO NOT GIVE THEM TO YOUR DOCTOR.  A prescription for pain medication may be given to you upon discharge.  Take your pain medication as prescribed, if needed.  If narcotic pain medicine is not needed, then you may take acetaminophen (Tylenol) or ibuprofen (Advil) as needed. Take your usually prescribed medications unless otherwise directed. If you need a refill on your pain medication, please contact your pharmacy.  They will contact our office to request authorization. Prescriptions will not be filled after 5pm or on week-ends. You should follow a light diet the first few days after arrival home, such as soup and crackers, etc.  Be sure to include lots of fluids daily. Most patients will experience some swelling and bruising in the area of the incisions.  Ice packs will help.  Swelling and bruising can take several days to resolve.  It is common to experience some constipation if taking pain medication after surgery.  Increasing fluid intake and taking a stool softener (such as Colace) will usually help or prevent this problem from occurring.  A mild laxative (Milk of Magnesia or Miralax) should be taken according to package instructions if there are no bowel movements after 48 hours. Unless discharge instructions indicate otherwise, you may remove your bandages 24-48 hours after surgery, and you may shower at that time.  You may have steri-strips (small skin tapes) in place directly over the incision.  These strips should be left on the skin for 7-10 days.  If your surgeon used skin glue on the incision, you may shower in 24 hours.  The glue will flake off over the next 2-3 weeks.  Any sutures or staples will be  removed at the office during your follow-up visit. ACTIVITIES:  You may resume regular (light) daily activities beginning the next day--such as daily self-care, walking, climbing stairs--gradually increasing activities as tolerated.  You may have sexual intercourse when it is comfortable.  Refrain from any heavy lifting or straining until approved by your doctor. You may drive when you are no longer taking prescription pain medication, you can comfortably wear a seatbelt, and you can safely maneuver your car and apply brakes. RETURN TO WORK:  __________________________________________________________ Monica [name] Bast should see your doctor in the office for a follow-up appointment approximately 2-3 weeks after your surgery.  Make sure that you call for this appointment within a day or two after you arrive home to insure a convenient appointment time. OTHER INSTRUCTIONS: RECORD DRAIN OUTPUT DAILY OK TO SHOWER WITH THE DRAINS AND JUST COVER BACK AGAIN WITH DRY GAUZE NO LIFTING MORE THAN 15 POUNDS FOR 2 WEEKS __________________________________________________________________________________________________________________________ __________________________________________________________________________________________________________________________ WHEN TO CALL YOUR DOCTOR: Fever over 101.0 Inability to urinate Continued bleeding from incision. Increased pain, redness, or drainage from the incision. Increasing abdominal pain  The clinic staff is available to answer your questions during regular business hours.  Please don't hesitate to call and ask to speak to one of the nurses for clinical concerns.  If you have a medical emergency, go to the nearest emergency room or call 911.  A surgeon from [facility] : [facility] is always on call at the hospital. [address] [place], [place], [place]  [address] ? [address], [place], [place]   [address] (  [phone] ? [phone] ? FAX [phone] Web  site: [email or web address]

## 2023-02-11 NOTE — Discharge Summary (Signed)
Physician Discharge Summary  Patient ID: Monica [name] MRN: [id number] DOB/AGE: [birth year] 47 y.o.  Admit date: 02/08/2023 Discharge date: 02/11/2023  Admission Diagnoses:  Discharge Diagnoses:  Principal Problem:   Bile leak   Discharged Condition: good  Hospital Course: The patient was readmitted to the hospital on 3/21 with continued postoperative abdominal pain and constipation.  She improved with medications and and a bowel regimen.  The drainage from her drains have been continuing to decrease.  She began tolerating a diet.  The decision was made on 3/24 to discharge patient home with the drains in place  Consults: None  Significant Diagnostic Studies:   Treatments: Bowel regimen and pain control  Discharge Exam: Blood pressure 114/74, pulse 70, temperature 98.7 F (37.1 C), temperature source Oral, resp. rate 16, height 5\' 8"  (1.727 m), weight 89 kg, last menstrual period 01/26/2023, SpO2 95 %. General appearance: alert, cooperative, and no distress Resp: clear to auscultation bilaterally Cardio: regular rate and rhythm, S1, S2 normal, no murmur, click, rub or gallop Incision/Wound: abdomen soft, drains with serosang fluid and some bile tinged fluid  Disposition: Discharge disposition: 01-Home or Self Care        Allergies as of 02/11/2023   No Known Allergies      Medication List     TAKE these medications    calcium carbonate 500 MG chewable tablet Commonly known as: TUMS - dosed in mg elemental calcium Chew 1 tablet by mouth 3 (three) times daily.   CELEBRATE MULTI-COMPLETE 45 PO Take 2 tablets by mouth daily.   fluconazole 100 MG tablet Commonly known as: DIFLUCAN Take 1 tablet (100 mg total) by mouth daily.   fluticasone 50 MCG/ACT nasal spray Commonly known as: FLONASE Place 1-2 sprays into both nostrils daily as needed for allergies.   gabapentin 100 MG capsule Commonly known as: NEURONTIN Take 2 capsules (200 mg total) by mouth every  12 (twelve) hours.   ketotifen 0.025 % ophthalmic solution Commonly known as: ZADITOR Place 1 drop into both eyes daily as needed (itchy eyes).   levocetirizine 5 MG tablet Commonly known as: XYZAL Take 5 mg by mouth every evening.   methocarbamol 750 MG tablet Commonly known as: Robaxin-750 Take 1 tablet (750 mg total) by mouth every 6 (six) hours as needed for muscle spasms.   ondansetron 4 MG disintegrating tablet Commonly known as: ZOFRAN-ODT Dissolve 1 tablet (4 mg total) by mouth every 6 (six) hours as needed for nausea or vomiting.   ondansetron 4 MG tablet Commonly known as: Zofran Take 1 tablet (4 mg total) by mouth every 8 (eight) hours as needed for nausea or vomiting.   oxyCODONE-acetaminophen 5-325 MG tablet Commonly known as: Percocet Take 1 tablet by mouth every 6 (six) hours as needed for severe pain. What changed: when to take this   polyethylene glycol 17 g packet Commonly known as: MiraLax Take 17 g by mouth daily as needed for mild constipation.   valACYclovir 500 MG tablet Commonly known as: VALTREX Take 500 mg by mouth daily.   VITAMIN D-3 PO Take 1 capsule by mouth daily.        Follow-up Information     Stechschulte, Nickola Major, MD. Schedule an appointment as soon as possible for a visit.   Specialty: Surgery Why: call the office in the morning.  make an appointment to see Dr. Thermon Leyland this week or next week Contact information: [id number] [address] [place] [address] [phone]  Signed: Coralie Keens 02/11/2023, 7:47 AM

## 2023-02-14 NOTE — Progress Notes (Signed)
Bile peritonitis ruled in

## 2023-03-28 NOTE — Therapy (Signed)
OUTPATIENT PHYSICAL THERAPY THORACOLUMBAR EVALUATION   Patient Name: Monica [name] MRN: [id number] DOB:1976/07/24, 47 y.o., female Today's Date: 03/28/2023  END OF SESSION:   Past Medical History:  Diagnosis Date   AMA (advanced maternal age) multigravida 35+    Anxiety    Asthma    Depression    had PP depression   HSV (herpes simplex virus) anogenital infection    Hx of HELLP syndrome, currently pregnant    PONV (postoperative nausea and vomiting)    Vaginal Pap smear, abnormal    Past Surgical History:  Procedure Laterality Date   ADENOIDECTOMY     APPENDECTOMY     BIOPSY  05/31/2022   Procedure: BIOPSY;  Surgeon: Quentin Ore, MD;  Location: [facility];  Service: General;;   bone spur removal     CESAREAN SECTION  2013   CESAREAN SECTION N/A 10/20/2017   Procedure: REPEAT CESAREAN SECTION WITH SUPRA CLITORAL SKIN TAG REMOVAL;  Surgeon: Zelphia Cairo, MD;  Location: [facility];  Service: Obstetrics;  Laterality: N/A;  Repeat edc 11/05/17 nkda    CESAREAN SECTION WITH BILATERAL TUBAL LIGATION N/A 03/15/2020   Procedure: CESAREAN SECTION WITH BILATERAL TUBAL LIGATION;  Surgeon: Ranae Pila, MD;  Location: [facility];  Service: Obstetrics;  Laterality: N/A;   CHOLECYSTECTOMY N/A 01/25/2023   Procedure: LAPAROSCOPIC CHOLECYSTECTOMY WITH INTRAOPERATIVE CHOLANGIOGRAM;  Surgeon: Quentin Ore, MD;  Location: [facility];  Service: General;  Laterality: N/A;   COLPOSCOPY     ESOPHAGOGASTRODUODENOSCOPY N/A 05/31/2022   Procedure: ESOPHAGOGASTRODUODENOSCOPY (EGD);  Surgeon: Quentin Ore, MD;  Location: [facility];  Service: General;  Laterality: N/A;   HIATAL HERNIA REPAIR N/A 08/07/2022   Procedure: HIATAL HERNIA REPAIR;  Surgeon: Quentin Ore, MD;  Location: [facility];  Service: General;  Laterality: N/A;   LAPAROSCOPIC APPENDECTOMY N/A 08/07/2022   Procedure: XI ROBOTI ASSISTED APPENDECTOMY;  Surgeon: Quentin Ore, MD;   Location: [facility];  Service: General;  Laterality: N/A;   LAPAROSCOPY N/A 01/29/2023   Procedure: LAPAROSCOPY DIAGNOSTIC WITH WASHOUT AND DRAIN PLACEMENT;  Surgeon: Quentin Ore, MD;  Location: [facility];  Service: General;  Laterality: N/A;   LASIK     tonsil suregery     additional tissue removal   TONSILLECTOMY     Patient Active Problem List   Diagnosis Date Noted   Protein-calorie malnutrition, severe 02/01/2023   Bile leak 01/27/2023   Morbid obesity with BMI of 40.0-44.9, adult (HCC) 08/07/2022   Perforated appendicitis 07/06/2022   Acute appendicitis with perforation, localized peritonitis, and abscess 07/06/2022   Pregnant and not yet delivered in third trimester 03/15/2020   S/P cesarean section 10/20/2017    PCP: Jarrett Soho, PA-C  REFERRING PROVIDER: Stechschulte, Hyman Hopes, MD  REFERRING DIAG: Post op deconditioning  Rationale for Evaluation and Treatment: Rehabilitation  THERAPY DIAG:  No diagnosis found.  ONSET DATE: ***  SUBJECTIVE:  SUBJECTIVE STATEMENT: ***  PERTINENT HISTORY:  S/p lap chole 01/25/23, washout/drain placement 01/29/23, readmitted 02/08/23 Anxiety/depressoin  PAIN:  Are you having pain? {OPRCPAIN:27236}  PRECAUTIONS: {Therapy precautions:24002}  WEIGHT BEARING RESTRICTIONS: No  FALLS:  Has patient fallen in last 6 months? {fallsyesno:27318}  LIVING ENVIRONMENT: Lives with: {[facility] lives with:25569::"lives with their family"} Lives in: {Lives in:25570} Stairs: {opstairs:27293} Has following equipment at home: {Assistive devices:23999}  OCCUPATION: ***  PLOF: {PLOF:24004}  PATIENT GOALS: ***  NEXT MD VISIT: ***  OBJECTIVE:   DIAGNOSTIC FINDINGS:  ***  PATIENT SURVEYS:  {rehab surveys:24030}   COGNITION: Overall cognitive status:  {cognition:24006}     SENSATION: {sensation:27233}  MUSCLE LENGTH: Hamstrings: Right *** deg; Left *** deg Thomas test: Right *** deg; Left *** deg  POSTURE: {posture:25561}  PALPATION: ***  LUMBAR ROM:   AROM eval  Flexion   Extension   Right lateral flexion   Left lateral flexion   Right rotation   Left rotation    (Blank rows = not tested)  LOWER EXTREMITY ROM:     {AROM/PROM:27142}  Right eval Left eval  Hip flexion    Hip extension    Hip internal rotation    Hip external rotation    Knee extension    Knee flexion    (Blank rows = not tested) (Key: WFL = within functional limits not formally assessed, * = concordant pain, s = stiffness/stretching sensation, NT = not tested)  Comments:    STRENGTH TESTING:  MMT Right eval Left eval  Shoulder flexion    Elbow flexion    Elbow extension    Grip strength (gross)    Hip flexion    Hip abduction (modified sitting)    Knee flexion    Knee extension    Ankle dorsiflexion    Ankle plantarflexion     (Blank rows = not tested) (Key: WFL = within functional limits not formally assessed, * = concordant pain, s = stiffness/stretching sensation, NT = not tested)  Comments:    LUMBAR SPECIAL TESTS:  {lumbar special test:25242}  FUNCTIONAL TESTS:  {Functional tests:24029}  GAIT: Distance walked: *** Assistive device utilized: {Assistive devices:23999} Level of assistance: {Levels of assistance:24026} Comments: ***  TODAY'S TREATMENT:                                                                                                                              [facility] Adult PT Treatment:                                                DATE: 03/29/23 Therapeutic Exercise: *** Manual Therapy: *** Neuromuscular re-ed: *** Therapeutic Activity: *** Modalities: *** Self Care: ***   PATIENT EDUCATION:  Education details: Pt education on PT impairments, prognosis, and POC. Informed consent. Rationale for  interventions, safe/appropriate HEP performance Person  educated: Patient Education method: Explanation, Demonstration, Tactile cues, Verbal cues, and Handouts Education comprehension: verbalized understanding, returned demonstration, verbal cues required, tactile cues required, and needs further education    HOME EXERCISE PROGRAM: ***  ASSESSMENT:  CLINICAL IMPRESSION: Patient is a 47 y.o. woman who was seen today for physical therapy evaluation and treatment for post op deconditioning s/p lap chole 01/29/23. ***    OBJECTIVE IMPAIRMENTS: {opptimpairments:25111}.   ACTIVITY LIMITATIONS: {activitylimitations:27494}  PARTICIPATION LIMITATIONS: {participationrestrictions:25113}  PERSONAL FACTORS: {Personal factors:25162} are also affecting patient's functional outcome.   REHAB POTENTIAL: {rehabpotential:25112}  CLINICAL DECISION MAKING: {clinical decision making:25114}  EVALUATION COMPLEXITY: {Evaluation complexity:25115}   GOALS: Goals reviewed with patient? {yes/no:20286}  SHORT TERM GOALS: Target date: ***  Pt will demonstrate appropriate understanding and performance of initially prescribed HEP in order to facilitate improved independence with management of symptoms.  Baseline: HEP provided on eval Goal status: INITIAL   2. Pt will score greater than or equal to *** on FOTO in order to demonstrate improved perception of function due to symptoms.  Baseline: ***  Goal status: INITIAL    LONG TERM GOALS: Target date: *** Pt will score *** on FOTO in order to demonstrate improved perception of functional status due to symptoms.  Baseline: *** Goal status: INITIAL  2.  Pt will demonstrate *** lumbar AROM in order to demonstrate improved tolerance to functional movement patterns.   Baseline: *** Goal status: INITIAL  3.  Pt will demonstrate hip MMT of *** in order to demonstrate improved strength for functional movements.  Baseline: *** Goal status: INITIAL  4. Pt  will perform 5xSTS in <*** sec in order to demonstrate reduced fall risk and improved functional independence. (MCID of 2.3sec)  Baseline: ***  Goal status: INITIAL   PLAN:  PT FREQUENCY: {rehab frequency:25116}  PT DURATION: {rehab duration:25117}  PLANNED INTERVENTIONS: {rehab planned interventions:25118::"Therapeutic exercises","Therapeutic activity","Neuromuscular re-education","Balance training","Gait training","Patient/Family education","Self Care","Joint mobilization"}.  PLAN FOR NEXT SESSION: Progress ROM/strengthening exercises as able/appropriate, review HEP.    Ashley Murrain PT, DPT 03/28/2023 1:15 PM

## 2023-03-29 ENCOUNTER — Encounter: Payer: Self-pay | Admitting: Physical Therapy

## 2023-03-29 ENCOUNTER — Other Ambulatory Visit: Payer: Self-pay

## 2023-03-29 ENCOUNTER — Ambulatory Visit: Payer: Commercial Managed Care - PPO | Attending: Surgery | Admitting: Physical Therapy

## 2023-03-29 DIAGNOSIS — R2689 Other abnormalities of gait and mobility: Secondary | ICD-10-CM | POA: Diagnosis present

## 2023-03-29 DIAGNOSIS — M6281 Muscle weakness (generalized): Secondary | ICD-10-CM

## 2023-04-04 ENCOUNTER — Ambulatory Visit: Payer: Commercial Managed Care - PPO | Admitting: Physical Therapy

## 2023-04-04 ENCOUNTER — Encounter: Payer: Self-pay | Admitting: Physical Therapy

## 2023-04-04 DIAGNOSIS — M6281 Muscle weakness (generalized): Secondary | ICD-10-CM | POA: Diagnosis not present

## 2023-04-04 DIAGNOSIS — R2689 Other abnormalities of gait and mobility: Secondary | ICD-10-CM

## 2023-04-04 NOTE — Therapy (Signed)
OUTPATIENT PHYSICAL THERAPY TREATMENT NOTE   Patient Name: Monica [name] MRN: [id number] DOB:February 02, 1976, 47 y.o., female Today's Date: 04/04/2023  PCP: Jarrett Soho, PA-C   REFERRING PROVIDER: Stechschulte, Hyman Hopes, MD  END OF SESSION:   PT End of Session - 04/04/23 1148     Visit Number 2    Number of Visits 17    Date for PT Re-Evaluation 05/24/23    Authorization Type [employer]    Authorization Time Period no auth, 40VL PT/OT    PT Start Time 1145    PT Stop Time 1230    PT Time Calculation (min) 45 min             Past Medical History:  Diagnosis Date   AMA (advanced maternal age) multigravida 35+    Anxiety    Asthma    Depression    had PP depression   HSV (herpes simplex virus) anogenital infection    Hx of HELLP syndrome, currently pregnant    PONV (postoperative nausea and vomiting)    Vaginal Pap smear, abnormal    Past Surgical History:  Procedure Laterality Date   ADENOIDECTOMY     APPENDECTOMY     BIOPSY  05/31/2022   Procedure: BIOPSY;  Surgeon: Quentin Ore, MD;  Location: [facility];  Service: General;;   bone spur removal     CESAREAN SECTION  2013   CESAREAN SECTION N/A 10/20/2017   Procedure: REPEAT CESAREAN SECTION WITH SUPRA CLITORAL SKIN TAG REMOVAL;  Surgeon: Zelphia Cairo, MD;  Location: [facility];  Service: Obstetrics;  Laterality: N/A;  Repeat edc 11/05/17 nkda    CESAREAN SECTION WITH BILATERAL TUBAL LIGATION N/A 03/15/2020   Procedure: CESAREAN SECTION WITH BILATERAL TUBAL LIGATION;  Surgeon: Ranae Pila, MD;  Location: [facility];  Service: Obstetrics;  Laterality: N/A;   CHOLECYSTECTOMY N/A 01/25/2023   Procedure: LAPAROSCOPIC CHOLECYSTECTOMY WITH INTRAOPERATIVE CHOLANGIOGRAM;  Surgeon: Quentin Ore, MD;  Location: [facility];  Service: General;  Laterality: N/A;   COLPOSCOPY     ESOPHAGOGASTRODUODENOSCOPY N/A 05/31/2022   Procedure: ESOPHAGOGASTRODUODENOSCOPY (EGD);  Surgeon:  Quentin Ore, MD;  Location: [facility];  Service: General;  Laterality: N/A;   HIATAL HERNIA REPAIR N/A 08/07/2022   Procedure: HIATAL HERNIA REPAIR;  Surgeon: Quentin Ore, MD;  Location: [facility];  Service: General;  Laterality: N/A;   LAPAROSCOPIC APPENDECTOMY N/A 08/07/2022   Procedure: XI ROBOTI ASSISTED APPENDECTOMY;  Surgeon: Quentin Ore, MD;  Location: [facility];  Service: General;  Laterality: N/A;   LAPAROSCOPY N/A 01/29/2023   Procedure: LAPAROSCOPY DIAGNOSTIC WITH WASHOUT AND DRAIN PLACEMENT;  Surgeon: Quentin Ore, MD;  Location: [facility];  Service: General;  Laterality: N/A;   LASIK     tonsil suregery     additional tissue removal   TONSILLECTOMY     Patient Active Problem List   Diagnosis Date Noted   Protein-calorie malnutrition, severe 02/01/2023   Bile leak 01/27/2023   Morbid obesity with BMI of 40.0-44.9, adult (HCC) 08/07/2022   Perforated appendicitis 07/06/2022   Acute appendicitis with perforation, localized peritonitis, and abscess 07/06/2022   Pregnant and not yet delivered in third trimester 03/15/2020   S/P cesarean section 10/20/2017    REFERRING DIAG: Post op deconditioning  THERAPY DIAG:  Muscle weakness (generalized)  Other abnormalities of gait and mobility  Rationale for Evaluation and Treatment Rehabilitation  PERTINENT HISTORY:  S/p lap chole 01/25/23, washout/drain placement 01/29/23, readmitted 02/08/23 Anxiety/depressoin  PRECAUTIONS: none  SUBJECTIVE:  SUBJECTIVE STATEMENT:  Right  shoulder hurts when I lift my daughter. 5/10. Left  hip hurts with raising leg and sit to stands 5/10.    PAIN:  Are you having pain: yes; NPRS: 5/10 Location/description: R shoulder, left hip  - aggravating factors: prolonged positioning, prolonged  activity, raising R arm  - Easing factors: movement, medication     OBJECTIVE: (objective measures completed at initial evaluation unless otherwise dated)  DIAGNOSTIC FINDINGS:  Lap chole 01/25/23 Lap w/ drain placement 01/29/23 Discharged home 02/11/23     PATIENT SURVEYS:  FOTO 62 current, 73 predicted     COGNITION: Overall cognitive status: Within functional limits for tasks assessed                          SENSATION: NT   POSTURE:  relatively forward flexed at trunk, especially with initial standing. Tendency to lean towards L in sitting.   PALPATION: NT     LOWER EXTREMITY ROM:      Active  Right eval Left eval  Hip flexion      Hip extension      Hip internal rotation      Hip external rotation      Knee extension      Knee flexion      (Blank rows = not tested) (Key: WFL = within functional limits not formally assessed, * = concordant pain, s = stiffness/stretching sensation, NT = not tested)  Comments: Eval - grossly WNL in sagittal plane for LE, gross stiffness noted in BIL hip flexors and anterior trunk musculature initially with standing, mildly reduced shoulder flexion R compared to L but WFL    STRENGTH TESTING:   MMT Right eval Left eval  Shoulder flexion 4+ 4+  Shoulder abduction 4+ 4+  Elbow flexion 5 5  Elbow extension 5 5  Grip strength (gross)      Hip flexion 4- 4-  Hip abduction (modified sitting) 5 5  Knee flexion 5 5  Knee extension 5 5  Ankle dorsiflexion      Ankle plantarflexion       (Blank rows = not tested) (Key: WFL = within functional limits not formally assessed, * = concordant pain, s = stiffness/stretching sensation, NT = not tested)  Comments: mild discomfort in R shoulder with flex/abd and elbow flex/ext     FUNCTIONAL TESTS:  5xSTS: 11.56 sec no UE support, no pain but does endorse transient dizziness   GAIT: Distance walked: within clinic Assistive device utilized: None Level of assistance: Complete  Independence Comments: gait mechanics grossly WNL    TODAY'S TREATMENT:                                                                                                                              [facility] Adult PT Treatment:  DATE: 04/04/23 Therapeutic Exercise: Scap retract 5 sec 2 x 10  Chin tuck 5 sec x 10  Neck side bend right 38//  left 42 Upper trap and levator stretch  Supine chest press and pullovers with dowel  Issued HEP  Therapeutic Activity: Orthostatic Blood Pressure assessment: supine 5 minutes BP 111/71, HR 63: Standing 1 minute BP 107/78, HR 91; Standing 3 minutes BP 124/69, HR 85 6 MWT 1365 feet     [facility] Adult PT Treatment:                                                DATE: 03/29/23 Deferred given time constraints   PATIENT EDUCATION:  Education details: Pt education on PT impairments, prognosis, and POC. Informed consent. Rationale for interventions. Orthostatics, checking BP Person educated: Patient Education method: Explanation, Demonstration, Tactile cues, Verbal cues, and Handouts Education comprehension: verbalized understanding, returned demonstration, verbal cues required, tactile cues required, and needs further education     HOME EXERCISE PROGRAM: TBD Access Code: [email or web address] URL: [email or web address] Date: 04/04/2023 Prepared by: Jannette Spanner  Exercises - Seated Scapular Retraction  - 1 x daily - 7 x weekly - 1-2 sets - 10 reps - 5 hold - Standing Cervical Retraction  - 1 x daily - 7 x weekly - 1-2 sets - 10 reps - 5 hold - Supine Shoulder Flexion with Dowel  - 1 x daily - 7 x weekly - 2 sets - 10 reps - Gentle Levator Scapulae Stretch  - 1 x daily - 7 x weekly - 1 sets - 3 reps - 30 hold   ASSESSMENT:   CLINICAL IMPRESSION: Patient is a pleasant 47 y.o. woman who was seen today for physical therapy treatment for post op deconditioning s/p prolonged hospitalization in March 2024. Today her  pain is localized to right shoulder and left hip with pt voicing concern about her posture. Began posture education and postural activation exercises. Began gentle AAROM for shoulder flexion. Checked Orthostatic BP and pt did not display signs of orthostatic hypotension. She has dizziness with first standing up which resolves within 30 seconds. She reports overall the dizziness is improving. She was issued a postural HEP. Will plan to establish HEP for hip at next session.    EVAL: Pt also endorses multiple surgeries in fall of 2023 which have likely contributed to complaints as well. Pt endorses generalized fatigue, weakness, and pain since admission with reduced tolerance to household activities, work activities, and childcare. On examination pt demonstrates mild hip weakness and shoulder weakness, 5xSTS not indicative of fall risk (cutoff score in various populations 12-14sec) although outside age cohort norm (MCID of 2.3sec, age cohort norm 6.2+/-1.3sec per Billie Ruddy et al 2007). No adverse events, pt tolerates session well overall aside from mild transient dizziness during 5xSTS (she states she has had dizziness w/ initially standing since admission, advised to monitor BP and take seated rest until symptoms resolve).     OBJECTIVE IMPAIRMENTS: decreased activity tolerance, decreased endurance, decreased mobility, difficulty walking, decreased strength, impaired perceived functional ability, postural dysfunction, and pain.    ACTIVITY LIMITATIONS: carrying, lifting, sitting, standing, squatting, sleeping, stairs, transfers, and locomotion level   PARTICIPATION LIMITATIONS: meal prep, cleaning, laundry, community activity, and occupation   PERSONAL FACTORS: Time since onset of injury/illness/exacerbation are also affecting patient's functional outcome.    REHAB POTENTIAL: Good  CLINICAL DECISION MAKING: Stable/uncomplicated   EVALUATION COMPLEXITY: Low     GOALS: Goals reviewed with patient?  No   SHORT TERM GOALS: Target date: 04/26/2023 Pt will demonstrate appropriate understanding and performance of initially prescribed HEP in order to facilitate improved independence with management of symptoms.  Baseline: HEP provided on eval Goal status: INITIAL    2. Pt will score greater than or equal to 67 on FOTO in order to demonstrate improved perception of function due to symptoms.            Baseline: 62            Goal status: INITIAL     LONG TERM GOALS: Target date: 05/24/2023   Pt will score 73 on FOTO in order to demonstrate improved perception of functional status due to symptoms.  Baseline: 62 Goal status: INITIAL   2.  Pt will demonstrate ability to lift >35# with less than 5/10 on RPE scale in order to improve tolerance to childcare and household activities.  Baseline: significant difficulty with lifting/housework Goal status: INITIAL   3.  Pt will demonstrate hip flexion MMT of 4+/5 bilaterally in order to demonstrate improved strength for functional movements.  Baseline: see MMT chart above Goal status: INITIAL   4. Pt will perform 5xSTS in <9sec sec in order to demonstrate reduced fall risk and improved functional independence. (MCID of 2.3sec)            Baseline: 11 sec no UE support             Goal status: INITIAL    5. Pt will improve at least MCID on in order to indicate improved functional tolerance.            Baseline: NT on eval given time constraints            Goal status: INITIAL    6. Pt will report ability to work full 8hr work day in order to facilitate improved return to [employer] and maximize functional tolerance.             Baseline: working half days            Goal status: INITIAL    PLAN:   PT FREQUENCY: 2x/week   PT DURATION: 8 weeks   PLANNED INTERVENTIONS: Therapeutic exercises, Therapeutic activity, Neuromuscular re-education, Balance training, Gait training, Patient/Family education, Self Care, Joint mobilization, Stair training,  Aquatic Therapy, Dry Needling, Spinal mobilization, Cryotherapy, Moist heat, Taping, Manual therapy, and Re-evaluation.   PLAN FOR NEXT SESSION: Establish HEP, generalized strengthening with emphasis on hip/shoulder. Right shoulder capsule stretch , issue hip HEP and assess response to initial hep.    Jannette Spanner, PTA 04/04/23 1:04 PM Phone: [phone] Fax: [phone]

## 2023-04-09 ENCOUNTER — Telehealth: Payer: Self-pay | Admitting: Physical Therapy

## 2023-04-09 NOTE — Telephone Encounter (Signed)
Lvm regarding appts available. Also advised to enroll in Mychart and receive text notifications for open appt.

## 2023-04-17 ENCOUNTER — Encounter: Payer: Self-pay | Admitting: Physical Therapy

## 2023-04-17 ENCOUNTER — Ambulatory Visit: Payer: Commercial Managed Care - PPO | Admitting: Physical Therapy

## 2023-04-17 ENCOUNTER — Other Ambulatory Visit: Payer: Self-pay

## 2023-04-17 DIAGNOSIS — R2689 Other abnormalities of gait and mobility: Secondary | ICD-10-CM

## 2023-04-17 DIAGNOSIS — M6281 Muscle weakness (generalized): Secondary | ICD-10-CM | POA: Diagnosis not present

## 2023-04-17 NOTE — Patient Instructions (Signed)

## 2023-04-17 NOTE — Therapy (Signed)
OUTPATIENT PHYSICAL THERAPY TREATMENT NOTE   Patient Name: Monica [name] MRN: [id number] DOB:January 04, 1976, 47 y.o., female Today's Date: 04/17/2023  PCP: Jarrett Soho, PA-C   REFERRING PROVIDER: Stechschulte, Hyman Hopes, MD  END OF SESSION:   PT End of Session - 04/17/23 0931     Visit Number 3    Number of Visits 17    Date for PT Re-Evaluation 05/24/23    Authorization Type [employer]    Authorization Time Period no auth, 40VL PT/OT    PT Start Time [phone]    PT Stop Time 1015    PT Time Calculation (min) 44 min    Activity Tolerance Patient tolerated treatment well    Behavior During Therapy WFL for tasks assessed/performed              Past Medical History:  Diagnosis Date   AMA (advanced maternal age) multigravida 35+    Anxiety    Asthma    Depression    had PP depression   HSV (herpes simplex virus) anogenital infection    Hx of HELLP syndrome, currently pregnant    PONV (postoperative nausea and vomiting)    Vaginal Pap smear, abnormal    Past Surgical History:  Procedure Laterality Date   ADENOIDECTOMY     APPENDECTOMY     BIOPSY  05/31/2022   Procedure: BIOPSY;  Surgeon: Quentin Ore, MD;  Location: [facility];  Service: General;;   bone spur removal     CESAREAN SECTION  2013   CESAREAN SECTION N/A 10/20/2017   Procedure: REPEAT CESAREAN SECTION WITH SUPRA CLITORAL SKIN TAG REMOVAL;  Surgeon: Zelphia Cairo, MD;  Location: [facility];  Service: Obstetrics;  Laterality: N/A;  Repeat edc 11/05/17 nkda    CESAREAN SECTION WITH BILATERAL TUBAL LIGATION N/A 03/15/2020   Procedure: CESAREAN SECTION WITH BILATERAL TUBAL LIGATION;  Surgeon: Ranae Pila, MD;  Location: [facility];  Service: Obstetrics;  Laterality: N/A;   CHOLECYSTECTOMY N/A 01/25/2023   Procedure: LAPAROSCOPIC CHOLECYSTECTOMY WITH INTRAOPERATIVE CHOLANGIOGRAM;  Surgeon: Quentin Ore, MD;  Location: [facility];  Service: General;  Laterality: N/A;    COLPOSCOPY     ESOPHAGOGASTRODUODENOSCOPY N/A 05/31/2022   Procedure: ESOPHAGOGASTRODUODENOSCOPY (EGD);  Surgeon: Quentin Ore, MD;  Location: [facility];  Service: General;  Laterality: N/A;   HIATAL HERNIA REPAIR N/A 08/07/2022   Procedure: HIATAL HERNIA REPAIR;  Surgeon: Quentin Ore, MD;  Location: [facility];  Service: General;  Laterality: N/A;   LAPAROSCOPIC APPENDECTOMY N/A 08/07/2022   Procedure: XI ROBOTI ASSISTED APPENDECTOMY;  Surgeon: Quentin Ore, MD;  Location: [facility];  Service: General;  Laterality: N/A;   LAPAROSCOPY N/A 01/29/2023   Procedure: LAPAROSCOPY DIAGNOSTIC WITH WASHOUT AND DRAIN PLACEMENT;  Surgeon: Quentin Ore, MD;  Location: [facility];  Service: General;  Laterality: N/A;   LASIK     tonsil suregery     additional tissue removal   TONSILLECTOMY     Patient Active Problem List   Diagnosis Date Noted   Protein-calorie malnutrition, severe 02/01/2023   Bile leak 01/27/2023   Morbid obesity with BMI of 40.0-44.9, adult (HCC) 08/07/2022   Perforated appendicitis 07/06/2022   Acute appendicitis with perforation, localized peritonitis, and abscess 07/06/2022   Pregnant and not yet delivered in third trimester 03/15/2020   S/P cesarean section 10/20/2017    REFERRING DIAG: Post op deconditioning  THERAPY DIAG:  Muscle weakness (generalized)  Other abnormalities of gait and mobility  Rationale for Evaluation and Treatment  Rehabilitation  PERTINENT HISTORY:  S/p lap chole 01/25/23, washout/drain placement 01/29/23, readmitted 02/08/23 Anxiety/depressoin  PRECAUTIONS: none  SUBJECTIVE:                                                                                                                                                                                      SUBJECTIVE STATEMENT:  Right shoulder still  hurts when I lift my daughter. 6/10.   I am a stomach sleeper and I hurt my Shoulder  My left leg is feeling much better now.  I  feel like my dizziness is resolving  I feel fine today.   PAIN:  Are you having pain: yes; NPRS: 5/10 Location/description: R shoulder, left hip  - aggravating factors: prolonged positioning, prolonged activity, raising R arm  - Easing factors: movement, medication     OBJECTIVE: (objective measures completed at initial evaluation unless otherwise dated)  DIAGNOSTIC FINDINGS:  Lap chole 01/25/23 Lap w/ drain placement 01/29/23 Discharged home 02/11/23     PATIENT SURVEYS:  FOTO 62 current, 73 predicted     COGNITION: Overall cognitive status: Within functional limits for tasks assessed                          SENSATION: NT   POSTURE:  relatively forward flexed at trunk, especially with initial standing. Tendency to lean towards L in sitting.   PALPATION: NT     LOWER EXTREMITY ROM:      Active  Right eval Left eval  Hip flexion      Hip extension      Hip internal rotation      Hip external rotation      Knee extension      Knee flexion      (Blank rows = not tested) (Key: WFL = within functional limits not formally assessed, * = concordant pain, s = stiffness/stretching sensation, NT = not tested)  Comments: Eval - grossly WNL in sagittal plane for LE, gross stiffness noted in BIL hip flexors and anterior trunk musculature initially with standing, mildly reduced shoulder flexion R compared to L but WFL    STRENGTH TESTING:   MMT Right eval Left eval  Shoulder flexion 4+ 4+  Shoulder abduction 4+ 4+  Elbow flexion 5 5  Elbow extension 5 5  Grip strength (gross)      Hip flexion 4- 4-  Hip abduction (modified sitting) 5 5  Knee flexion 5 5  Knee extension 5 5  Ankle dorsiflexion      Ankle plantarflexion       (Blank rows = not tested) (Key: WFL = within  functional limits not formally assessed, * = concordant pain, s = stiffness/stretching sensation, NT = not tested)  Comments: mild discomfort in R shoulder with flex/abd and elbow flex/ext      FUNCTIONAL TESTS:  5xSTS: 11.56 sec no UE support, no pain but does endorse transient dizziness   GAIT: Distance walked: within clinic Assistive device utilized: None Level of assistance: Complete Independence Comments: gait mechanics grossly WNL    TODAY'S TREATMENT:  [facility] Adult PT Treatment:                                                DATE: 04-17-23 R shld flexion initial 120 and after TPDN 135 Right shld flexion Therapeutic Exercise: Scap retract 5 sec 2 x 10 with decreased pain after TPDN Chin tuck 5 sec x 10  Supine chest press and pullovers with dowel Upper trap and levator stretch  Supine Using RTB for star pattern (horizontal abd and diagonals) 3 x 10  Manual Therapy: STW to R shoulder periscapular muscles Gentle thoracic extension with PA mobs Grade II Trigger Point Dry-Needling performed     by Garen Lah Treatment instructions: Expect mild to moderate muscle soreness. S/S of pneumothorax if dry needled over a lung field, and to seek immediate medical attention should they occur. Patient verbalized understanding of these instructions and education.  Patient Consent Given: Yes Education handout provided: Previously provided Muscles treated: R shld subscapular, R UT and LS  Electrical stimulation performed: No Parameters: N/A Treatment response/outcome: twitch response noted, pt noted relief  Modalities  moist hot pack to cervical and upper back                                                                                                                           [facility] Adult PT Treatment:                                                DATE: 04/04/23 Therapeutic Exercise: Scap retract 5 sec 2 x 10  Chin tuck 5 sec x 10  Neck side bend right 38//  left 42 Upper trap and levator stretch  Supine chest press and pullovers with dowel  Issued HEP  Therapeutic Activity: Orthostatic Blood Pressure assessment: supine 5 minutes BP 111/71, HR 63: Standing 1 minute BP  107/78, HR 91; Standing 3 minutes BP 124/69, HR 85 6 MWT 1365 feet     [facility] Adult PT Treatment:  DATE: 03/29/23 Deferred given time constraints   PATIENT EDUCATION:  Education details: Pt education on PT impairments, prognosis, and POC. Informed consent. Rationale for interventions. Orthostatics, checking BP Person educated: Patient Education method: Explanation, Demonstration, Tactile cues, Verbal cues, and Handouts Education comprehension: verbalized understanding, returned demonstration, verbal cues required, tactile cues required, and needs further education     HOME EXERCISE PROGRAM: TBD Access Code: [email or web address] URL: [email or web address] Date: 04/04/2023 Prepared by: Jannette Spanner  Exercises - Seated Scapular Retraction  - 1 x daily - 7 x weekly - 1-2 sets - 10 reps - 5 hold - Standing Cervical Retraction  - 1 x daily - 7 x weekly - 1-2 sets - 10 reps - 5 hold - Supine Shoulder Flexion with Dowel  - 1 x daily - 7 x weekly - 2 sets - 10 reps - Gentle Levator Scapulae Stretch  - 1 x daily - 7 x weekly - 1 sets - 3 reps - 30 hold  Added 04-17-23 - Standing Shoulder Diagonal Horizontal Abduction 60/120 Degrees with Resistance  - 1 x daily - 7 x weekly - 3 sets - 10 reps - Standing Shoulder Horizontal Abduction with Resistance  - 1 x daily - 7 x weekly - 3 sets - 10 reps ASSESSMENT:   CLINICAL IMPRESSION: Patient is a pleasant 47 y.o. woman who was seen today for physical therapy treatment for post op deconditioning s/p prolonged hospitalization in March 2024. Session today concentrates on pain control and consented to [facility] for R shoulder pain.  Pt with increased R shld flexion from 120 to 135 post TPDN and ease of performing exercises compared to previous days.  Pt HEP updated to include supine subscapular strengthening. No orthostatic tension today Today her pain is localized to right shoulder and left hip with pt voicing  concern about her posture. Began posture education and postural activation exercises. Began gentle AAROM for shoulder flexion. Checked Orthostatic BP and pt did not display signs of orthostatic hypotension today with no dizziness and able to rise from supine with no adverse effects. Pt leaves with no adverse effects and stated she received great benefit from today session.   EVAL: Pt also endorses multiple surgeries in fall of 2023 which have likely contributed to complaints as well. Pt endorses generalized fatigue, weakness, and pain since admission with reduced tolerance to household activities, work activities, and childcare. On examination pt demonstrates mild hip weakness and shoulder weakness, 5xSTS not indicative of fall risk (cutoff score in various populations 12-14sec) although outside age cohort norm (MCID of 2.3sec, age cohort norm 6.2+/-1.3sec per Billie Ruddy et al 2007). No adverse events, pt tolerates session well overall aside from mild transient dizziness during 5xSTS (she states she has had dizziness w/ initially standing since admission, advised to monitor BP and take seated rest until symptoms resolve).     OBJECTIVE IMPAIRMENTS: decreased activity tolerance, decreased endurance, decreased mobility, difficulty walking, decreased strength, impaired perceived functional ability, postural dysfunction, and pain.    ACTIVITY LIMITATIONS: carrying, lifting, sitting, standing, squatting, sleeping, stairs, transfers, and locomotion level   PARTICIPATION LIMITATIONS: meal prep, cleaning, laundry, community activity, and occupation   PERSONAL FACTORS: Time since onset of injury/illness/exacerbation are also affecting patient's functional outcome.    REHAB POTENTIAL: Good   CLINICAL DECISION MAKING: Stable/uncomplicated   EVALUATION COMPLEXITY: Low     GOALS: Goals reviewed with patient? No   SHORT TERM GOALS: Target date: 04/26/2023 Pt will demonstrate appropriate understanding and  performance of initially prescribed HEP in  order to facilitate improved independence with management of symptoms.  Baseline: HEP provided on eval Goal status: ONGOING   2. Pt will score greater than or equal to 67 on FOTO in order to demonstrate improved perception of function due to symptoms.            Baseline: 62            Goal status: INITIAL     LONG TERM GOALS: Target date: 05/24/2023   Pt will score 73 on FOTO in order to demonstrate improved perception of functional status due to symptoms.  Baseline: 62 Goal status: INITIAL   2.  Pt will demonstrate ability to lift >35# with less than 5/10 on RPE scale in order to improve tolerance to childcare and household activities.  Baseline: significant difficulty with lifting/housework Goal status: INITIAL   3.  Pt will demonstrate hip flexion MMT of 4+/5 bilaterally in order to demonstrate improved strength for functional movements.  Baseline: see MMT chart above Goal status: INITIAL   4. Pt will perform 5xSTS in <9sec sec in order to demonstrate reduced fall risk and improved functional independence. (MCID of 2.3sec)            Baseline: 11 sec no UE support             Goal status: INITIAL    5. Pt will improve at least MCID on in order to indicate improved functional tolerance.            Baseline: NT on eval given time constraints            Goal status: INITIAL    6. Pt will report ability to work full 8hr work day in order to facilitate improved return to [employer] and maximize functional tolerance.             Baseline: working half days            Goal status: INITIAL    PLAN:   PT FREQUENCY: 2x/week   PT DURATION: 8 weeks   PLANNED INTERVENTIONS: Therapeutic exercises, Therapeutic activity, Neuromuscular re-education, Balance training, Gait training, Patient/Family education, Self Care, Joint mobilization, Stair training, Aquatic Therapy, Dry Needling, Spinal mobilization, Cryotherapy, Moist heat, Taping, Manual  therapy, and Re-evaluation.   PLAN FOR NEXT SESSION: Establish HEP, generalized strengthening with emphasis on hip/shoulder. Right shoulder capsule stretch , issue hip HEP and assess response to initial hep.  May TPDN to [facility] and R scalene next visit.   Garen Lah, PT, A[facility] Certified Exercise Expert for the Aging Adult  04/17/23 10:19 AM Phone: [phone] Fax: [phone]

## 2023-04-24 ENCOUNTER — Ambulatory Visit: Payer: Commercial Managed Care - PPO | Attending: Surgery | Admitting: Physical Therapy

## 2023-04-24 ENCOUNTER — Encounter: Payer: Self-pay | Admitting: Physical Therapy

## 2023-04-24 DIAGNOSIS — R2689 Other abnormalities of gait and mobility: Secondary | ICD-10-CM | POA: Diagnosis present

## 2023-04-24 DIAGNOSIS — M6281 Muscle weakness (generalized): Secondary | ICD-10-CM | POA: Insufficient documentation

## 2023-04-24 NOTE — Therapy (Signed)
OUTPATIENT PHYSICAL THERAPY TREATMENT NOTE   Patient Name: Monica [name] MRN: [id number] DOB:1976/08/04, 47 y.o., female Today's Date: 04/24/2023  PCP: Jarrett Soho, PA-C   REFERRING PROVIDER: Stechschulte, Hyman Hopes, MD  END OF SESSION:   PT End of Session - 04/24/23 1001     Visit Number 4    Number of Visits 17    Date for PT Re-Evaluation 05/24/23    Authorization Type [employer]    Authorization Time Period no auth, 40VL PT/OT    PT Start Time [phone]    PT Stop Time 1017    PT Time Calculation (min) 39 min    Activity Tolerance Patient tolerated treatment well    Behavior During Therapy WFL for tasks assessed/performed               Past Medical History:  Diagnosis Date   AMA (advanced maternal age) multigravida 35+    Anxiety    Asthma    Depression    had PP depression   HSV (herpes simplex virus) anogenital infection    Hx of HELLP syndrome, currently pregnant    PONV (postoperative nausea and vomiting)    Vaginal Pap smear, abnormal    Past Surgical History:  Procedure Laterality Date   ADENOIDECTOMY     APPENDECTOMY     BIOPSY  05/31/2022   Procedure: BIOPSY;  Surgeon: Quentin Ore, MD;  Location: [facility];  Service: General;;   bone spur removal     CESAREAN SECTION  2013   CESAREAN SECTION N/A 10/20/2017   Procedure: REPEAT CESAREAN SECTION WITH SUPRA CLITORAL SKIN TAG REMOVAL;  Surgeon: Zelphia Cairo, MD;  Location: [facility];  Service: Obstetrics;  Laterality: N/A;  Repeat edc 11/05/17 nkda    CESAREAN SECTION WITH BILATERAL TUBAL LIGATION N/A 03/15/2020   Procedure: CESAREAN SECTION WITH BILATERAL TUBAL LIGATION;  Surgeon: Ranae Pila, MD;  Location: [facility];  Service: Obstetrics;  Laterality: N/A;   CHOLECYSTECTOMY N/A 01/25/2023   Procedure: LAPAROSCOPIC CHOLECYSTECTOMY WITH INTRAOPERATIVE CHOLANGIOGRAM;  Surgeon: Quentin Ore, MD;  Location: [facility];  Service: General;  Laterality: N/A;    COLPOSCOPY     ESOPHAGOGASTRODUODENOSCOPY N/A 05/31/2022   Procedure: ESOPHAGOGASTRODUODENOSCOPY (EGD);  Surgeon: Quentin Ore, MD;  Location: [facility];  Service: General;  Laterality: N/A;   HIATAL HERNIA REPAIR N/A 08/07/2022   Procedure: HIATAL HERNIA REPAIR;  Surgeon: Quentin Ore, MD;  Location: [facility];  Service: General;  Laterality: N/A;   LAPAROSCOPIC APPENDECTOMY N/A 08/07/2022   Procedure: XI ROBOTI ASSISTED APPENDECTOMY;  Surgeon: Quentin Ore, MD;  Location: [facility];  Service: General;  Laterality: N/A;   LAPAROSCOPY N/A 01/29/2023   Procedure: LAPAROSCOPY DIAGNOSTIC WITH WASHOUT AND DRAIN PLACEMENT;  Surgeon: Quentin Ore, MD;  Location: [facility];  Service: General;  Laterality: N/A;   LASIK     tonsil suregery     additional tissue removal   TONSILLECTOMY     Patient Active Problem List   Diagnosis Date Noted   Protein-calorie malnutrition, severe 02/01/2023   Bile leak 01/27/2023   Morbid obesity with BMI of 40.0-44.9, adult (HCC) 08/07/2022   Perforated appendicitis 07/06/2022   Acute appendicitis with perforation, localized peritonitis, and abscess 07/06/2022   Pregnant and not yet delivered in third trimester 03/15/2020   S/P cesarean section 10/20/2017    REFERRING DIAG: Post op deconditioning  THERAPY DIAG:  Muscle weakness (generalized)  Other abnormalities of gait and mobility  Rationale for Evaluation and  Treatment Rehabilitation  PERTINENT HISTORY:  S/p lap chole 01/25/23, washout/drain placement 01/29/23, readmitted 02/08/23 Anxiety/depressoin  PRECAUTIONS: none  SUBJECTIVE:                                                                                                                                                                                      SUBJECTIVE STATEMENT: I have been singing TPDN praisies. I am still sore but my AROM is better.  I have pain but not the same intensity today. I am a 4/10 and less sharp    more achy.   PAIN:  Are you having pain: yes; NPRS: 5/10 Location/description: R shoulder, left hip  - aggravating factors: prolonged positioning, prolonged activity, raising R arm  - Easing factors: movement, medication     OBJECTIVE: (objective measures completed at initial evaluation unless otherwise dated)  DIAGNOSTIC FINDINGS:  Lap chole 01/25/23 Lap w/ drain placement 01/29/23 Discharged home 02/11/23     PATIENT SURVEYS:  FOTO 62 current, 73 predicted     COGNITION: Overall cognitive status: Within functional limits for tasks assessed                          SENSATION: NT   POSTURE:  relatively forward flexed at trunk, especially with initial standing. Tendency to lean towards L in sitting.   PALPATION: NT    04-24-23  Shoulder AROM   R flexion after TPDN 158, R abd  155 LOWER EXTREMITY ROM:      Active  Right eval Left eval  Hip flexion      Hip extension      Hip internal rotation      Hip external rotation      Knee extension      Knee flexion      (Blank rows = not tested) (Key: WFL = within functional limits not formally assessed, * = concordant pain, s = stiffness/stretching sensation, NT = not tested)  Comments: Eval - grossly WNL in sagittal plane for LE, gross stiffness noted in BIL hip flexors and anterior trunk musculature initially with standing, mildly reduced shoulder flexion R compared to L but WFL    STRENGTH TESTING:   MMT Right eval Left eval  Shoulder flexion 4+ 4+  Shoulder abduction 4+ 4+  Elbow flexion 5 5  Elbow extension 5 5  Grip strength (gross)      Hip flexion 4- 4-  Hip abduction (modified sitting) 5 5  Knee flexion 5 5  Knee extension 5 5  Ankle dorsiflexion      Ankle plantarflexion       (Blank rows = not  tested) (Key: WFL = within functional limits not formally assessed, * = concordant pain, s = stiffness/stretching sensation, NT = not tested)  Comments: mild discomfort in R shoulder with flex/abd and elbow  flex/ext     FUNCTIONAL TESTS:  5xSTS: 11.56 sec no UE support, no pain but does endorse transient dizziness   GAIT: Distance walked: within clinic Assistive device utilized: None Level of assistance: Complete Independence Comments: gait mechanics grossly WNL    TODAY'S TREATMENT:  [facility] Adult PT Treatment:                                                DATE: 04-24-23 Therapeutic Exercise: Chin tuck 5 sec x 10  Upper trap and levator stretch  Supine Using GTB for star pattern (horizontal abd and diagonals) 3 x 10  Left  Side lying scapular trio 3 x 10 each with 1 lb DB Manual Therapy: STW to R shoulder periscapular muscles Gentle thoracic extension with PA mobs Grade II Trigger Point Dry-Needling performed     by Garen Lah Treatment instructions: Expect mild to moderate muscle soreness. S/S of pneumothorax if dry needled over a lung field, and to seek immediate medical attention should they occur. Patient verbalized understanding of these instructions and education.  Patient Consent Given: Yes Education handout provided: Previously provided Muscles treated: R shld subscapular, R UT and LS , R scalene and R C 3/4 cervial paraspinal and R SCM Electrical stimulation performed: No Parameters: N/A Treatment response/outcome: twitch response noted, pt noted relief    [facility] Adult PT Treatment:                                                DATE: 04-17-23 R shld flexion initial 120 and after TPDN 135 Right shld flexion Therapeutic Exercise: Scap retract 5 sec 2 x 10 with decreased pain after TPDN Chin tuck 5 sec x 10  Supine chest press and pullovers with dowel Upper trap and levator stretch  Supine Using RTB for star pattern (horizontal abd and diagonals) 3 x 10  Manual Therapy: STW to R shoulder periscapular muscles Gentle thoracic extension with PA mobs Grade II Trigger Point Dry-Needling performed     by Garen Lah Treatment instructions: Expect mild to moderate  muscle soreness. S/S of pneumothorax if dry needled over a lung field, and to seek immediate medical attention should they occur. Patient verbalized understanding of these instructions and education.  Patient Consent Given: Yes Education handout provided: Previously provided Muscles treated: R shld subscapular, R UT and LS  Electrical stimulation performed: No Parameters: N/A Treatment response/outcome: twitch response noted, pt noted relief  Modalities  moist hot pack to cervical and upper back  [facility] Adult PT Treatment:                                                DATE: 04/04/23 Therapeutic Exercise: Scap retract 5 sec 2 x 10  Chin tuck 5 sec x 10  Neck side bend right 38//  left 42 Upper trap and levator stretch  Supine chest press and pullovers with dowel  Issued HEP  Therapeutic Activity: Orthostatic Blood Pressure assessment: supine 5 minutes BP 111/71, HR 63: Standing 1 minute BP 107/78, HR 91; Standing 3 minutes BP 124/69, HR 85 6 MWT 1365 feet     [facility] Adult PT Treatment:                                                DATE: 03/29/23 Deferred given time constraints   PATIENT EDUCATION:  Education details: Pt education on PT impairments, prognosis, and POC. Informed consent. Rationale for interventions. Orthostatics, checking BP Person educated: Patient Education method: Explanation, Demonstration, Tactile cues, Verbal cues, and Handouts Education comprehension: verbalized understanding, returned demonstration, verbal cues required, tactile cues required, and needs further education     HOME EXERCISE PROGRAM: TBD Access Code: [email or web address] URL: [email or web address] Date: 04/04/2023 Prepared by: Jannette Spanner  Exercises - Seated Scapular Retraction  - 1 x daily - 7 x weekly - 1-2 sets - 10 reps - 5 hold - Standing Cervical Retraction  - 1  x daily - 7 x weekly - 1-2 sets - 10 reps - 5 hold - Supine Shoulder Flexion with Dowel  - 1 x daily - 7 x weekly - 2 sets - 10 reps - Gentle Levator Scapulae Stretch  - 1 x daily - 7 x weekly - 1 sets - 3 reps - 30 hold    Added 04-17-23 - Standing Shoulder Diagonal Horizontal Abduction 60/120 Degrees with Resistance  - 1 x daily - 7 x weekly - 3 sets - 10 reps - Standing Shoulder Horizontal Abduction with Resistance  - 1 x daily - 7 x weekly - 3 sets - 10 reps  Added 6-4-24Program Notes Left side lying scapular trio with  R shld flexion, shld abd and Shld External rotation as shown in clinic  3 x 10 each.      CLINICAL IMPRESSION: Ms Els enters today and consented to [facility] after successfully decreasing pain last week after TPDN.  She was seen today for physical therapy treatment for post op deconditioning s/p prolonged hospitalization in March 2024. Session today concentrates on pain control and consented to [facility] for R shoulder pain.  Pt with increased R shld flexion to 158 flex and 155 post TPDN and ease of performing exercises compared to previous days.  Pt HEP updated to include  scapular trio with return demo and 1 lb weight.  No orthostatic tension today  Began posture education and postural activation exercises. Began AROM exercise today. Pt leaves with no adverse effects and stated she received great benefit from today session.   EVAL: Pt also endorses multiple surgeries in fall of 2023 which have likely contributed to complaints as well. Pt endorses generalized fatigue, weakness, and pain since admission with reduced tolerance to household activities, work activities, and childcare. On examination pt demonstrates  mild hip weakness and shoulder weakness, 5xSTS not indicative of fall risk (cutoff score in various populations 12-14sec) although outside age cohort norm (MCID of 2.3sec, age cohort norm 6.2+/-1.3sec per Billie Ruddy et al 2007). No adverse events, pt tolerates session well  overall aside from mild transient dizziness during 5xSTS (she states she has had dizziness w/ initially standing since admission, advised to monitor BP and take seated rest until symptoms resolve).     OBJECTIVE IMPAIRMENTS: decreased activity tolerance, decreased endurance, decreased mobility, difficulty walking, decreased strength, impaired perceived functional ability, postural dysfunction, and pain.    ACTIVITY LIMITATIONS: carrying, lifting, sitting, standing, squatting, sleeping, stairs, transfers, and locomotion level   PARTICIPATION LIMITATIONS: meal prep, cleaning, laundry, community activity, and occupation   PERSONAL FACTORS: Time since onset of injury/illness/exacerbation are also affecting patient's functional outcome.    REHAB POTENTIAL: Good   CLINICAL DECISION MAKING: Stable/uncomplicated   EVALUATION COMPLEXITY: Low     GOALS: Goals reviewed with patient? No   SHORT TERM GOALS: Target date: 04/26/2023 Pt will demonstrate appropriate understanding and performance of initially prescribed HEP in order to facilitate improved independence with management of symptoms.  Baseline: HEP provided on eval Goal status: ONGOING   2. Pt will score greater than or equal to 67 on FOTO in order to demonstrate improved perception of function due to symptoms.            Baseline: 62            Goal status: INITIAL     LONG TERM GOALS: Target date: 05/24/2023   Pt will score 73 on FOTO in order to demonstrate improved perception of functional status due to symptoms.  Baseline: 62 Goal status: INITIAL   2.  Pt will demonstrate ability to lift >35# with less than 5/10 on RPE scale in order to improve tolerance to childcare and household activities.  Baseline: significant difficulty with lifting/housework Goal status: INITIAL   3.  Pt will demonstrate hip flexion MMT of 4+/5 bilaterally in order to demonstrate improved strength for functional movements.  Baseline: see MMT chart  above Goal status: INITIAL   4. Pt will perform 5xSTS in <9sec sec in order to demonstrate reduced fall risk and improved functional independence. (MCID of 2.3sec)            Baseline: 11 sec no UE support             Goal status: INITIAL    5. Pt will improve at least MCID on in order to indicate improved functional tolerance.            Baseline: NT on eval given time constraints            Goal status: INITIAL    6. Pt will report ability to work full 8hr work day in order to facilitate improved return to [employer] and maximize functional tolerance.             Baseline: working half days            Goal status: INITIAL    PLAN:   PT FREQUENCY: 2x/week   PT DURATION: 8 weeks   PLANNED INTERVENTIONS: Therapeutic exercises, Therapeutic activity, Neuromuscular re-education, Balance training, Gait training, Patient/Family education, Self Care, Joint mobilization, Stair training, Aquatic Therapy, Dry Needling, Spinal mobilization, Cryotherapy, Moist heat, Taping, Manual therapy, and Re-evaluation.   PLAN FOR NEXT SESSION: Establish HEP, generalized strengthening with emphasis on hip/shoulder. Right shoulder capsule stretch , issue hip HEP and  assess response to initial hep.  May TPDN to [facility] and R scalene next visit.   Garen Lah, PT, ATRIC Certified Exercise Expert for the Aging Adult  04/24/23 10:19 AM Phone: [phone] Fax: [phone]

## 2023-04-26 ENCOUNTER — Telehealth: Payer: Self-pay

## 2023-04-26 NOTE — Telephone Encounter (Signed)
Called patient to inform her of cancellation since PT is on PAL

## 2023-04-27 ENCOUNTER — Ambulatory Visit: Payer: Commercial Managed Care - PPO | Admitting: Physical Therapy

## 2023-05-08 ENCOUNTER — Ambulatory Visit: Payer: Commercial Managed Care - PPO | Admitting: Physical Therapy

## 2023-05-11 ENCOUNTER — Encounter: Payer: Commercial Managed Care - PPO | Admitting: Physical Therapy

## 2023-05-14 NOTE — Therapy (Signed)
OUTPATIENT PHYSICAL THERAPY TREATMENT NOTE   Patient Name: Monica [name] MRN: [id number] DOB:[birth year], 47 y.o., female Today's Date: 05/15/2023  PCP: Jarrett Soho, PA-C   REFERRING PROVIDER: Stechschulte, Hyman Hopes, MD  END OF SESSION:   PT End of Session - 05/15/23 0930     Visit Number 5    Number of Visits 17    Date for PT Re-Evaluation 05/24/23    Authorization Type [employer]    Authorization Time Period no auth, 40VL PT/OT    PT Start Time 0930    PT Stop Time 1013    PT Time Calculation (min) 43 min    Activity Tolerance Patient tolerated treatment well    Behavior During Therapy WFL for tasks assessed/performed                Past Medical History:  Diagnosis Date   AMA (advanced maternal age) multigravida 35+    Anxiety    Asthma    Depression    had PP depression   HSV (herpes simplex virus) anogenital infection    Hx of HELLP syndrome, currently pregnant    PONV (postoperative nausea and vomiting)    Vaginal Pap smear, abnormal    Past Surgical History:  Procedure Laterality Date   ADENOIDECTOMY     APPENDECTOMY     BIOPSY  05/31/2022   Procedure: BIOPSY;  Surgeon: Quentin Ore, MD;  Location: [facility];  Service: General;;   bone spur removal     CESAREAN SECTION  2013   CESAREAN SECTION N/A 10/20/2017   Procedure: REPEAT CESAREAN SECTION WITH SUPRA CLITORAL SKIN TAG REMOVAL;  Surgeon: Zelphia Cairo, MD;  Location: [facility];  Service: Obstetrics;  Laterality: N/A;  Repeat edc 11/05/17 nkda    CESAREAN SECTION WITH BILATERAL TUBAL LIGATION N/A 03/15/2020   Procedure: CESAREAN SECTION WITH BILATERAL TUBAL LIGATION;  Surgeon: Ranae Pila, MD;  Location: [facility];  Service: Obstetrics;  Laterality: N/A;   CHOLECYSTECTOMY N/A 01/25/2023   Procedure: LAPAROSCOPIC CHOLECYSTECTOMY WITH INTRAOPERATIVE CHOLANGIOGRAM;  Surgeon: Quentin Ore, MD;  Location: [facility];  Service: General;  Laterality: N/A;    COLPOSCOPY     ESOPHAGOGASTRODUODENOSCOPY N/A 05/31/2022   Procedure: ESOPHAGOGASTRODUODENOSCOPY (EGD);  Surgeon: Quentin Ore, MD;  Location: [facility];  Service: General;  Laterality: N/A;   HIATAL HERNIA REPAIR N/A 08/07/2022   Procedure: HIATAL HERNIA REPAIR;  Surgeon: Quentin Ore, MD;  Location: [facility];  Service: General;  Laterality: N/A;   LAPAROSCOPIC APPENDECTOMY N/A 08/07/2022   Procedure: XI ROBOTI ASSISTED APPENDECTOMY;  Surgeon: Quentin Ore, MD;  Location: [facility];  Service: General;  Laterality: N/A;   LAPAROSCOPY N/A 01/29/2023   Procedure: LAPAROSCOPY DIAGNOSTIC WITH WASHOUT AND DRAIN PLACEMENT;  Surgeon: Quentin Ore, MD;  Location: [facility];  Service: General;  Laterality: N/A;   LASIK     tonsil suregery     additional tissue removal   TONSILLECTOMY     Patient Active Problem List   Diagnosis Date Noted   Protein-calorie malnutrition, severe 02/01/2023   Bile leak 01/27/2023   Morbid obesity with BMI of 40.0-44.9, adult (HCC) 08/07/2022   Perforated appendicitis 07/06/2022   Acute appendicitis with perforation, localized peritonitis, and abscess 07/06/2022   Pregnant and not yet delivered in third trimester 03/15/2020   S/P cesarean section 10/20/2017    REFERRING DIAG: Post op deconditioning  THERAPY DIAG:  Muscle weakness (generalized)  Other abnormalities of gait and mobility  Rationale for Evaluation  and Treatment Rehabilitation  PERTINENT HISTORY:  S/p lap chole 01/25/23, washout/drain placement 01/29/23, readmitted 02/08/23 Anxiety/depressoin  PRECAUTIONS: none  SUBJECTIVE:                                                                                                                                                                                      SUBJECTIVE STATEMENT:  No pain at present, does state she had some increased pain after last session but overall still doing better. Has been able to do her HEP some,  having some soreness. Most of her pain is in her R shoulder with movement. Tailbone pain remains about the same.     PAIN:  Are you having pain: yes; NPRS: 0/10 Location/description: R shoulder, left hip  - aggravating factors: prolonged positioning, prolonged activity, raising R arm  - Easing factors: movement, medication     OBJECTIVE: (objective measures completed at initial evaluation unless otherwise dated)  DIAGNOSTIC FINDINGS:  Lap chole 01/25/23 Lap w/ drain placement 01/29/23 Discharged home 02/11/23     PATIENT SURVEYS:  FOTO 62 current, 73 predicted      COGNITION: Overall cognitive status: Within functional limits for tasks assessed                          SENSATION: NT   POSTURE:  relatively forward flexed at trunk, especially with initial standing. Tendency to lean towards L in sitting.   PALPATION: NT    04-24-23  Shoulder AROM   R flexion after TPDN 158, R abd  155 LOWER EXTREMITY ROM:      Active  Right eval Left eval  Hip flexion      Hip extension      Hip internal rotation      Hip external rotation      Knee extension      Knee flexion      (Blank rows = not tested) (Key: WFL = within functional limits not formally assessed, * = concordant pain, s = stiffness/stretching sensation, NT = not tested)  Comments: Eval - grossly WNL in sagittal plane for LE, gross stiffness noted in BIL hip flexors and anterior trunk musculature initially with standing, mildly reduced shoulder flexion R compared to L but WFL    STRENGTH TESTING:   MMT Right eval Left eval  Shoulder flexion 4+ 4+  Shoulder abduction 4+ 4+  Elbow flexion 5 5  Elbow extension 5 5  Grip strength (gross)      Hip flexion 4- 4-  Hip abduction (modified sitting) 5 5  Knee flexion 5 5  Knee extension 5 5  Ankle dorsiflexion  Ankle plantarflexion       (Blank rows = not tested) (Key: WFL = within functional limits not formally assessed, * = concordant pain, s =  stiffness/stretching sensation, NT = not tested)  Comments: mild discomfort in R shoulder with flex/abd and elbow flex/ext     FUNCTIONAL TESTS:  5xSTS: 11.56 sec no UE support, no pain but does endorse transient dizziness   GAIT: Distance walked: within clinic Assistive device utilized: None Level of assistance: Complete Independence Comments: gait mechanics grossly WNL    TODAY'S TREATMENT:  [facility] Adult PT Treatment:                                                DATE: 05/15/23 Therapeutic Exercise: Hooklying swiss ball press 2x10 cues for reduced neck compensations and breath control Hooklying adductor iso x10 cues for breath control Hooklying GH flexion AAROM w/ dowel 2x6 cues on second set for increased muscular tension Hooklying GH flexion w dowel + adductor iso 2x7 cues for pacing Seated 3# bicep curl 2x10 Bil  HEP update + handout/education  Therapeutic Activity: Significant time spent with education/discussion re: symptom behavior, response to activity, symptom modification strategies with exercise/activity     PATIENT EDUCATION:  Education details: rationale for interventions, HEP  Person educated: Patient Education method: Explanation, Demonstration, Tactile cues, Verbal cues, and Handouts Education comprehension: verbalized understanding, returned demonstration, verbal cues required, tactile cues required, and needs further education     HOME EXERCISE PROGRAM: Access Code: [email or web address] URL: [email or web address] Date: 05/15/2023 Prepared by: Fransisco Hertz  Program Notes - for shoulder flexion, please add adduction isometric as done in clinic   Exercises - Standing Cervical Retraction  - 1 x daily - 7 x weekly - 1-2 sets - 10 reps - 5 hold - Supine Shoulder Flexion with Dowel  - 1 x daily - 7 x weekly - 2 sets - 10 reps - Supine Shoulder Horizontal Abduction with Resistance  - 1 x daily - 7 x weekly - 2 sets - 10 reps      CLINICAL  IMPRESSION: 05/15/2023 Pt arrives w/o pain, states she did have some increased shoulder pain after last session. Today focusing on introducing more core/hip work given continued report of sacral pain, also working on modifications to improve tolerance with GH exercises. Pain is transient and variable throughout session but pt denies any increase in resting pain, overall tolerates activity well today. Deferring manual today per discussion w/ pt, as upon review of last session activities she states manual seemed the most provocative. Pt denies any increase in pain on departure, HEP update as above to improve tolerance. No adverse events. Pt also notes that with return to work, she has had increase in L wrist pain (chronic), encouraged her to discuss with her provider and possibly look into appropriateness/benefit of OT or hand specialist referral. Recommend continuing along current POC in order to address relevant deficits and improve functional tolerance. Pt departs today's session in no acute distress, all voiced questions/concerns addressed appropriately from PT perspective.     EVAL: Pt also endorses multiple surgeries in fall of 2023 which have likely contributed to complaints as well. Pt endorses generalized fatigue, weakness, and pain since admission with reduced tolerance to household activities, work activities, and childcare. On examination pt demonstrates mild hip weakness and shoulder weakness, 5xSTS not indicative of fall risk (  cutoff score in various populations 12-14sec) although outside age cohort norm (MCID of 2.3sec, age cohort norm 6.2+/-1.3sec per Billie Ruddy et al 2007). No adverse events, pt tolerates session well overall aside from mild transient dizziness during 5xSTS (she states she has had dizziness w/ initially standing since admission, advised to monitor BP and take seated rest until symptoms resolve).     OBJECTIVE IMPAIRMENTS: decreased activity tolerance, decreased endurance,  decreased mobility, difficulty walking, decreased strength, impaired perceived functional ability, postural dysfunction, and pain.    ACTIVITY LIMITATIONS: carrying, lifting, sitting, standing, squatting, sleeping, stairs, transfers, and locomotion level   PARTICIPATION LIMITATIONS: meal prep, cleaning, laundry, community activity, and occupation   PERSONAL FACTORS: Time since onset of injury/illness/exacerbation are also affecting patient's functional outcome.    REHAB POTENTIAL: Good   CLINICAL DECISION MAKING: Stable/uncomplicated   EVALUATION COMPLEXITY: Low     GOALS: Goals reviewed with patient? No   SHORT TERM GOALS: Target date: 04/26/2023 Pt will demonstrate appropriate understanding and performance of initially prescribed HEP in order to facilitate improved independence with management of symptoms.  Baseline: HEP provided on eval 05/15/23: endorses performing HEP, although not daily  Goal status: ONGOING   2. Pt will score greater than or equal to 67 on FOTO in order to demonstrate improved perception of function due to symptoms.            Baseline: 62  05/15/23: NT, visit 5              Goal status: ONGOING    LONG TERM GOALS: Target date: 05/24/2023   Pt will score 73 on FOTO in order to demonstrate improved perception of functional status due to symptoms.  Baseline: 62 Goal status: INITIAL   2.  Pt will demonstrate ability to lift >35# with less than 5/10 on RPE scale in order to improve tolerance to childcare and household activities.  Baseline: significant difficulty with lifting/housework Goal status: INITIAL   3.  Pt will demonstrate hip flexion MMT of 4+/5 bilaterally in order to demonstrate improved strength for functional movements.  Baseline: see MMT chart above Goal status: INITIAL   4. Pt will perform 5xSTS in <9sec sec in order to demonstrate reduced fall risk and improved functional independence. (MCID of 2.3sec)            Baseline: 11 sec no UE  support             Goal status: INITIAL    5. Pt will improve at least MCID on in order to indicate improved functional tolerance.            Baseline: NT on eval given time constraints            Goal status: INITIAL    6. Pt will report ability to work full 8hr work day in order to facilitate improved return to [employer] and maximize functional tolerance.             Baseline: working half days            Goal status: INITIAL    PLAN:   PT FREQUENCY: 2x/week   PT DURATION: 8 weeks   PLANNED INTERVENTIONS: Therapeutic exercises, Therapeutic activity, Neuromuscular re-education, Balance training, Gait training, Patient/Family education, Self Care, Joint mobilization, Stair training, Aquatic Therapy, Dry Needling, Spinal mobilization, Cryotherapy, Moist heat, Taping, Manual therapy, and Re-evaluation.   PLAN FOR NEXT SESSION: consider continuing to defer manual pending pt response to this session. Continue working on [facility]  mobility/stability, lumbopelvic activation. Functional endurance as able/tolerated. FOTO/goals next session   Ashley Murrain PT, DPT 05/15/2023 1:24 PM

## 2023-05-15 ENCOUNTER — Encounter: Payer: Self-pay | Admitting: Physical Therapy

## 2023-05-15 ENCOUNTER — Ambulatory Visit: Payer: Commercial Managed Care - PPO | Admitting: Physical Therapy

## 2023-05-15 DIAGNOSIS — M6281 Muscle weakness (generalized): Secondary | ICD-10-CM

## 2023-05-15 DIAGNOSIS — R2689 Other abnormalities of gait and mobility: Secondary | ICD-10-CM

## 2023-05-22 ENCOUNTER — Ambulatory Visit: Payer: Commercial Managed Care - PPO | Admitting: Physical Therapy

## 2023-05-25 ENCOUNTER — Encounter: Payer: Commercial Managed Care - PPO | Admitting: Physical Therapy

## 2023-05-27 IMAGING — DX DG CHEST 2V
2 series · 2 of 2 positions shown · non-contrast
Comparison: None.

CLINICAL DATA: Morbid obesity. Pre-op evaluation for bariatric
surgery.

EXAM:
CHEST - 2 VIEW

[chest pa]
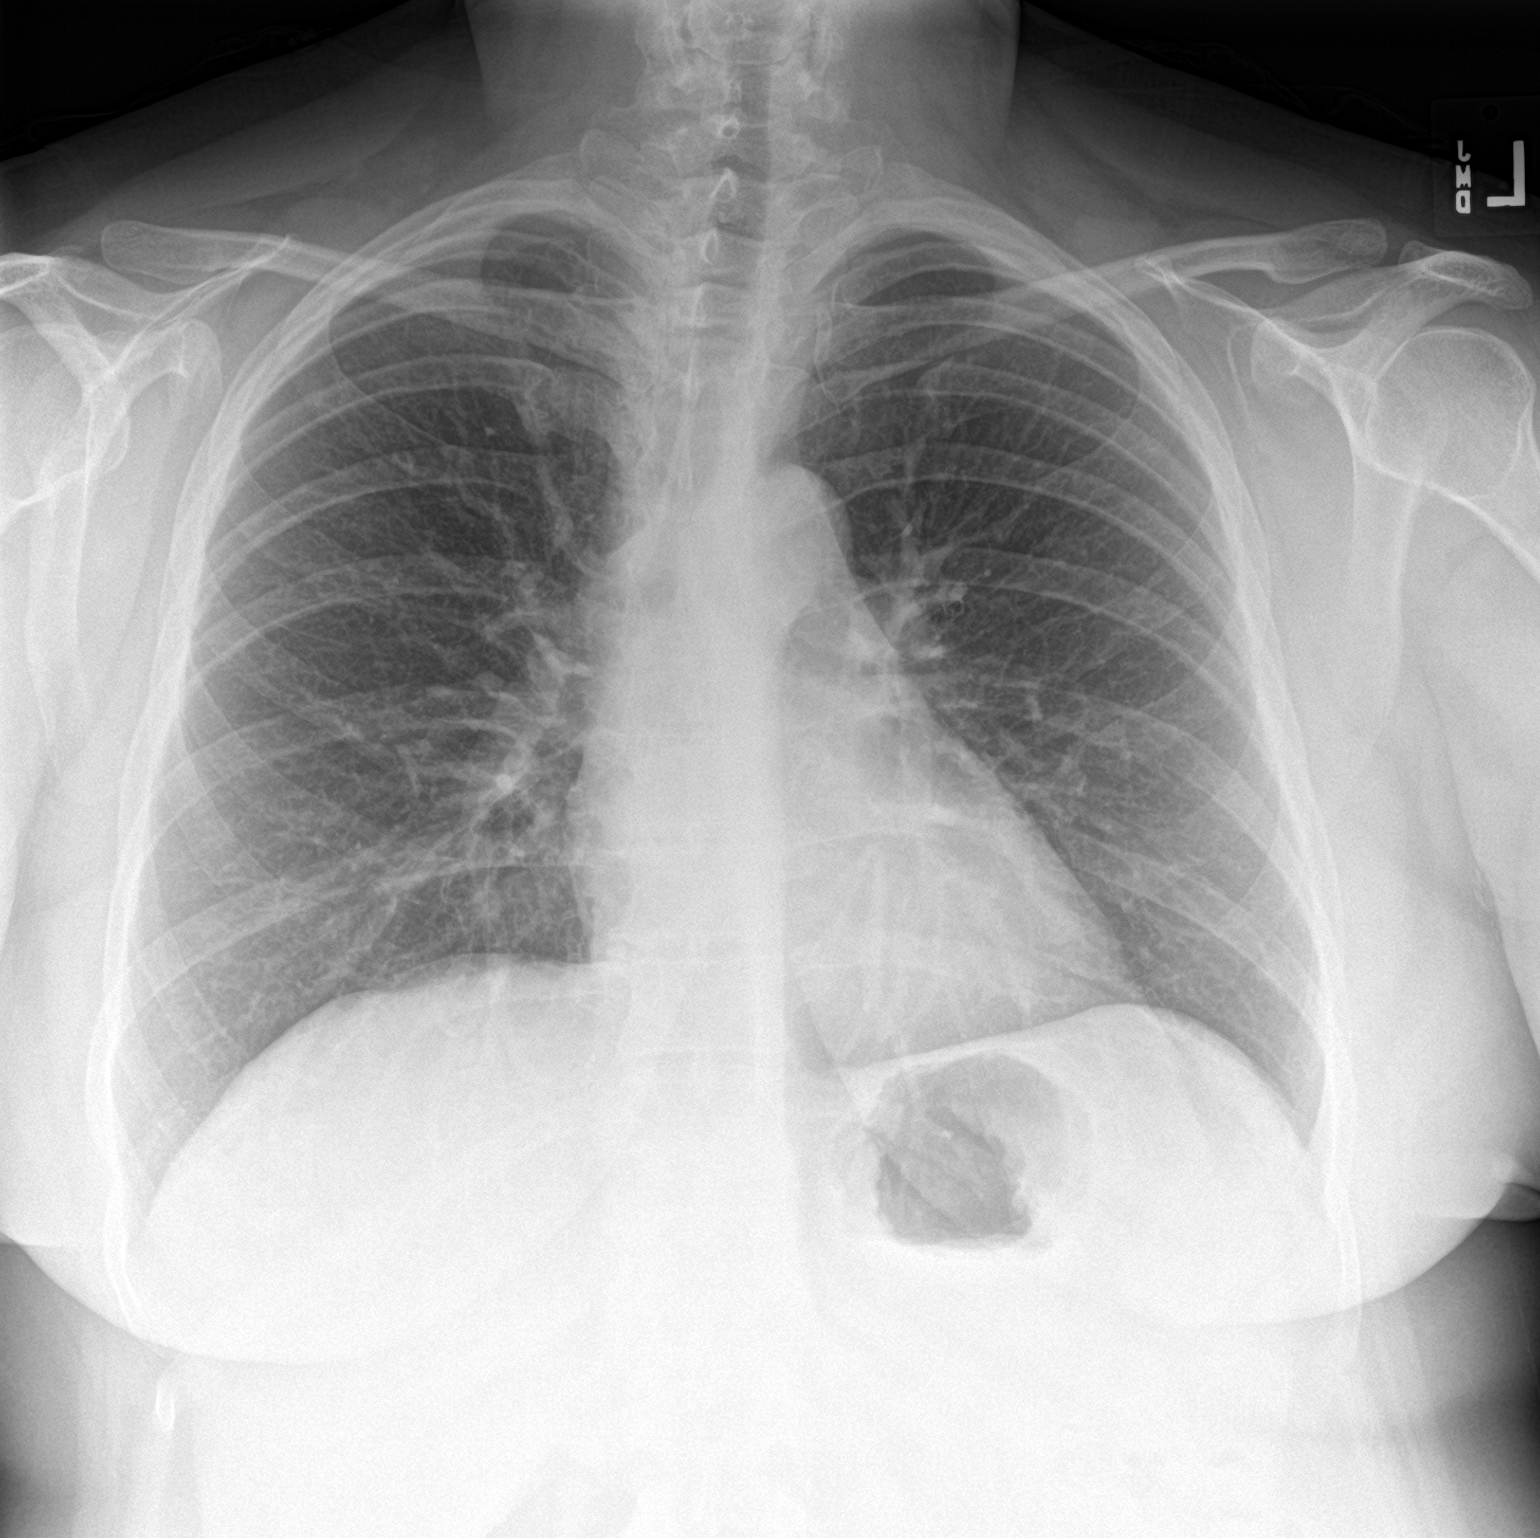

[chest lat]
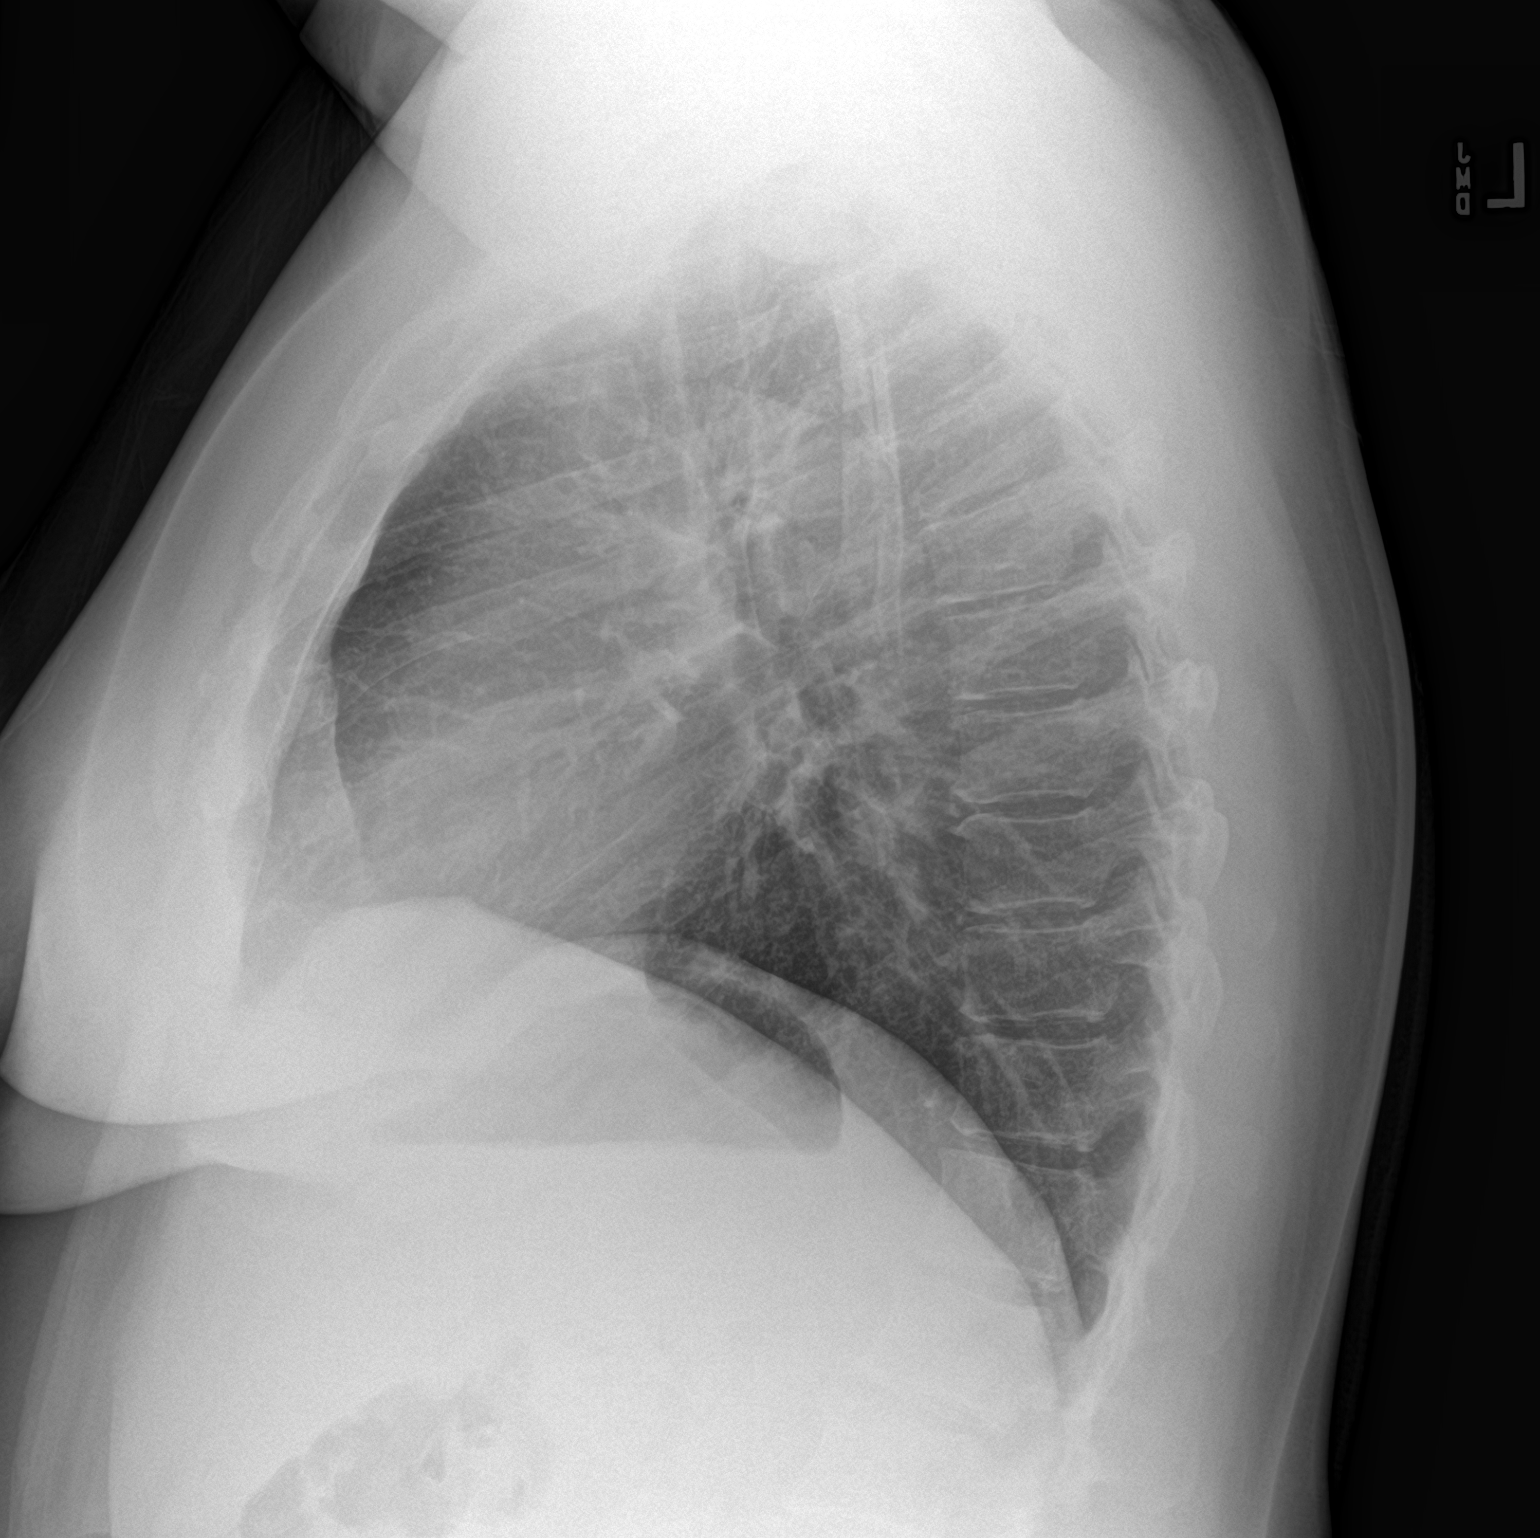

[2 of 2 positions shown; findings below may reference images not displayed]

FINDINGS: The heart size and mediastinal contours are within normal limits.
Both lungs are clear. The visualized skeletal structures are
unremarkable.
IMPRESSION: No active cardiopulmonary disease.

## 2023-05-31 NOTE — Therapy (Addendum)
 OUTPATIENT PHYSICAL THERAPY TREATMENT NOTE/ERO/DISCHARGE PHYSICAL THERAPY DISCHARGE SUMMARY  Visits from Start of Care: 6  Current functional level related to goals / functional outcomes: Pt still with pain in R shld and R hip at rest 0/10 at rest but with exertion can be at 8/10 pain in shld especially with horizontal adduction and with tailbone pain up to 9/10 especially noted when been on vacation in past 3 weeks and long driving.    Remaining deficits: Pt has deficits but  has made gains in strength ( See MMT chart),  Pt is mainly restricted today in clinic by her being triggered by being back in a medical facility.  Pt has deficits but desires to DC PT at this time and continue with community wellness/ pilaties etc.  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  Pt began crying in office about being triggered by being in a hospital facility and would rather not come.  PT told pt she understood and discussed benefits of seeing a counselor to work through some of her emotions and triggering events.  She agreed and said she will pursue something for her own mental health as needed.    Education / Equipment: HEP updated   Patient agrees to discharge. Patient goals were partially met. Patient is being discharged due to the patient's request. As indicated above.   Patient Name: Monica [name] MRN: [id number] DOB:02-May-1976, 47 y.o., female Today's Date: 06/05/2023  PCP: Jarrett Soho, PA-C   REFERRING PROVIDER: Stechschulte, Hyman Hopes, MD  END OF SESSION:   PT End of Session - 06/05/23 0931     Visit Number 6    Number of Visits 17    Date for PT Re-Evaluation 06/05/23    Authorization Type U[employer]    PT Start Time 0931    PT Stop Time 1015    PT Time Calculation (min) 44 min    Activity Tolerance Patient tolerated treatment well;Patient  limited by pain    Behavior During Therapy WFL for tasks assessed/performed                 Past Medical History:  Diagnosis Date   AMA (advanced maternal age) multigravida 35+    Anxiety    Asthma    Depression    had PP depression   HSV (herpes simplex virus) anogenital infection    Hx of HELLP syndrome, currently pregnant    PONV (postoperative nausea and vomiting)    Vaginal Pap smear, abnormal    Past Surgical History:  Procedure Laterality Date   ADENOIDECTOMY     APPENDECTOMY     BIOPSY  05/31/2022   Procedure: BIOPSY;  Surgeon: Quentin Ore, MD;  Location: [facility];  Service: General;;   bone spur removal     CESAREAN SECTION  2013   CESAREAN SECTION N/A 10/20/2017   Procedure: REPEAT CESAREAN SECTION WITH SUPRA CLITORAL SKIN TAG REMOVAL;  Surgeon: Zelphia Cairo, MD;  Location: [facility];  Service: Obstetrics;  Laterality: N/A;  Repeat edc 11/05/17 nkda    CESAREAN SECTION WITH BILATERAL TUBAL LIGATION N/A 03/15/2020   Procedure: CESAREAN SECTION WITH BILATERAL TUBAL LIGATION;  Surgeon: Ranae Pila, MD;  Location: [facility];  Service: Obstetrics;  Laterality: N/A;   CHOLECYSTECTOMY N/A 01/25/2023   Procedure: LAPAROSCOPIC CHOLECYSTECTOMY WITH INTRAOPERATIVE CHOLANGIOGRAM;  Surgeon:  Stechschulte, Hyman Hopes, MD;  Location: [facility];  Service: General;  Laterality: N/A;   COLPOSCOPY     ESOPHAGOGASTRODUODENOSCOPY N/A 05/31/2022   Procedure: ESOPHAGOGASTRODUODENOSCOPY (EGD);  Surgeon: Quentin Ore, MD;  Location: [facility];  Service: General;  Laterality: N/A;   HIATAL HERNIA REPAIR N/A 08/07/2022   Procedure: HIATAL HERNIA REPAIR;  Surgeon: Quentin Ore, MD;  Location: [facility];  Service: General;  Laterality: N/A;   LAPAROSCOPIC APPENDECTOMY N/A 08/07/2022   Procedure: XI ROBOTI ASSISTED APPENDECTOMY;  Surgeon: Quentin Ore, MD;  Location: [facility];  Service: General;  Laterality: N/A;   LAPAROSCOPY N/A 01/29/2023    Procedure: LAPAROSCOPY DIAGNOSTIC WITH WASHOUT AND DRAIN PLACEMENT;  Surgeon: Quentin Ore, MD;  Location: [facility];  Service: General;  Laterality: N/A;   LASIK     tonsil suregery     additional tissue removal   TONSILLECTOMY     Patient Active Problem List   Diagnosis Date Noted   Protein-calorie malnutrition, severe 02/01/2023   Bile leak 01/27/2023   Morbid obesity with BMI of 40.0-44.9, adult (HCC) 08/07/2022   Perforated appendicitis 07/06/2022   Acute appendicitis with perforation, localized peritonitis, and abscess 07/06/2022   Pregnant and not yet delivered in third trimester 03/15/2020   S/P cesarean section 10/20/2017    REFERRING DIAG: Post op deconditioning  THERAPY DIAG:  Muscle weakness (generalized) - Plan: PT plan of care cert/re-cert  Other abnormalities of gait and mobility - Plan: PT plan of care cert/re-cert  Rationale for Evaluation and Treatment Rehabilitation  PERTINENT HISTORY:  S/p lap chole 01/25/23, washout/drain placement 01/29/23, readmitted 02/08/23 Anxiety/depressoin  PRECAUTIONS: none  SUBJECTIVE:                                                                                                                                                                                      SUBJECTIVE STATEMENT:  I still have pain.  Today at rest is not pain but when I try to pull anything to my midline, I feel a sharp pain 7-8/10.  I have not been back in 3 weeks. Tailbone pain with driving on vacation is an 8-9/10. I would like to stop doing PT for now. I appreciate what you have done but coming back into a health care facility is kind of triggering for me. I would like to stop PT and just use my home program and maybe join some exercise group /program in the community. 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     PAIN:  Are you having pain: yes; NPRS: 0/10 Location/description:  R  shoulder, left hip  - aggravating factors: prolonged positioning, prolonged activity, raising R arm  - Easing factors: movement, medication     OBJECTIVE: (objective measures completed at initial evaluation unless otherwise dated)  DIAGNOSTIC FINDINGS:  Lap chole 01/25/23 Lap w/ drain placement 01/29/23 Discharged home 02/11/23     PATIENT SURVEYS:  FOTO 62 current, 73 predicted 06-05-23 86%      COGNITION: Overall cognitive status: Within functional limits for tasks assessed                          SENSATION: NT   POSTURE:  relatively forward flexed at trunk, especially with initial standing. Tendency to lean towards L in sitting.   PALPATION: NT    04-24-23  Shoulder AROM   R flexion after TPDN 158, R abd  155 7-16-24Shoulder AROM  R flexion 151,  L 164 LOWER EXTREMITY ROM:      Active  Right eval Left eval  Hip flexion      Hip extension      Hip internal rotation      Hip external rotation      Knee extension      Knee flexion      (Blank rows = not tested) (Key: WFL = within functional limits not formally assessed, * = concordant pain, s = stiffness/stretching sensation, NT = not tested)  Comments: Eval - grossly WNL in sagittal plane for LE, gross stiffness noted in BIL hip flexors and anterior trunk musculature initially with standing, mildly reduced shoulder flexion R compared to L but WFL    STRENGTH TESTING:   MMT Right eval Left eval R/L  06-05-23  Shoulder flexion 4+ 4+ 5/5  Shoulder abduction 4+ 4+ 4+/4+  Elbow flexion 5 5 5/5  Elbow extension 5 5 5/5  Grip strength (gross)       Hip flexion 4- 4- 4/4+  Hip abduction (modified sitting) 5 5 5/5  Knee flexion 5 5 5/5  Knee extension 5 5 5/5  Ankle dorsiflexion       Ankle plantarflexion        (Blank rows = not tested) (Key: WFL = within functional limits not formally assessed, * = concordant pain, s = stiffness/stretching sensation, NT = not tested)  Comments: mild discomfort in R shoulder with  flex/abd and elbow flex/ext     FUNCTIONAL TESTS:  5xSTS: 11.56 sec no UE support, no pain but does endorse transient dizziness    06-05-23 8.40 sec GAIT: Distance walked: within clinic Assistive device utilized: None Level of assistance: Complete Independence Comments: gait mechanics grossly WNL    TODAY'S TREATMENT:  [facility] Adult PT Treatment:                                                DATE: 06-05-23 Re evaluation as indicated in objective Updated HEP and reviewed HEP given for home use   [facility] Adult PT Treatment:                                                DATE: 05/15/23 Therapeutic Exercise: Hooklying swiss ball press 2x10 cues for reduced neck compensations and breath control Hooklying adductor  iso x10 cues for breath control Hooklying GH flexion AAROM w/ dowel 2x6 cues on second set for increased muscular tension Hooklying GH flexion w dowel + adductor iso 2x7 cues for pacing Seated 3# bicep curl 2x10 Bil  HEP update + handout/education  Therapeutic Activity: Significant time spent with education/discussion re: symptom behavior, response to activity, symptom modification strategies with exercise/activity     PATIENT EDUCATION:  Education details: rationale for interventions, HEP  Person educated: Patient Education method: Explanation, Demonstration, Tactile cues, Verbal cues, and Handouts Education comprehension: verbalized understanding, returned demonstration, verbal cues required, tactile cues required, and needs further education     HOME EXERCISE PROGRAM: Access Code: [email or web address] URL: [email or web address] Date: 05/15/2023 Prepared by: Fransisco Hertz  Program Notes - for shoulder flexion, please add adduction isometric as done in clinic   Exercises - Standing Cervical Retraction  - 1 x daily - 7 x weekly - 1-2 sets - 10 reps - 5 hold - Supine Shoulder Flexion with Dowel  - 1 x daily - 7 x weekly - 2 sets - 10 reps - Supine Shoulder Horizontal  Abduction with Resistance  - 1 x daily - 7 x weekly - 2 sets - 10 reps Added 06-05-23  - Sit to stand with sink support Movement snack  - 1 x daily - 7 x weekly - 3 sets - 10 reps - Reverse lunge holding onto counter  - 1 x daily - 7 x weekly - 3 sets - 10 reps - Standing Shoulder Horizontal Abduction with Resistance  - 1 x daily - 7 x weekly - 3 sets - 10 reps - Standing Shoulder Diagonal Horizontal Abduction 60/120 Degrees with Resistance  - 1 x daily - 7 x weekly - 3 sets - 10 reps      CLINICAL IMPRESSION: Pt enters clinic and states she would like to DC. Pt has continuing deficits in pain and AROM but  has made gains in strength ( See MMT chart),  Pt is mainly restricted today in clinic by her being triggered by being back in a medical facility after her experience in hospital and multiple surgeries.   Pt has deficits but desires to DC PT at this time and continue with community wellness/ pilaties etc.  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  Pt began crying in office about being triggered by being in a hospital facility and would rather not come but is more interested in being in a gym or group classes.  PT told pt she understood and discussed benefits of seeing a counselor to work through some of her emotions and triggering events.  She agreed and said she will pursue something for her own mental health as needed.  All LTG were either met or partially met . FOTO at 86% and met goal. She is able to lift 25 # KB but did not meet goal of 35# and did complain about irritation in R shld.  Pt is aware that she can return and see OT for more specialized treatment of wrist should she need that in the future. Pt is able to work a 5-6 hour day and met her 5 x STS goal.  Pt left with updated HEP and all questions answered and no other adverse reactions from  reevaluation today.        EVAL: Pt  also endorses multiple surgeries in fall of 2023 which have likely contributed to complaints as well. Pt endorses generalized fatigue, weakness, and pain since admission with reduced tolerance to household activities, work activities, and childcare. On examination pt demonstrates mild hip weakness and shoulder weakness, 5xSTS not indicative of fall risk (cutoff score in various populations 12-14sec) although outside age cohort norm (MCID of 2.3sec, age cohort norm 6.2+/-1.3sec per Billie Ruddy et al 2007). No adverse events, pt tolerates session well overall aside from mild transient dizziness during 5xSTS (she states she has had dizziness w/ initially standing since admission, advised to monitor BP and take seated rest until symptoms resolve).     OBJECTIVE IMPAIRMENTS: decreased activity tolerance, decreased endurance, decreased mobility, difficulty walking, decreased strength, impaired perceived functional ability, postural dysfunction, and pain.    ACTIVITY LIMITATIONS: carrying, lifting, sitting, standing, squatting, sleeping, stairs, transfers, and locomotion level   PARTICIPATION LIMITATIONS: meal prep, cleaning, laundry, community activity, and occupation   PERSONAL FACTORS: Time since onset of injury/illness/exacerbation are also affecting patient's functional outcome.    REHAB POTENTIAL: Good   CLINICAL DECISION MAKING: Stable/uncomplicated   EVALUATION COMPLEXITY: Low     GOALS: Goals reviewed with patient? No   SHORT TERM GOALS: Target date: 04/26/2023 Pt will demonstrate appropriate understanding and performance of initially prescribed HEP in order to facilitate improved independence with management of symptoms.  Baseline: HEP provided on eval 05/15/23: endorses performing HEP, although not daily -16-24 Goal status: MET   2. Pt will score greater than or equal to 67 on FOTO in order to demonstrate improved perception of function due to  symptoms.            Baseline: 62  05/15/23: NT, visit 5   06-05-23  86%            Goal status: MET   LONG TERM GOALS: Target date: 05/24/2023   Pt will score 73 on FOTO in order to demonstrate improved perception of functional status due to symptoms.  Baseline: 62 06-05-23 86% Goal status: Met   2.  Pt will demonstrate ability to lift >35# with less than 5/10 on RPE scale in order to improve tolerance to childcare and household activities.  Baseline: significant difficulty with lifting/housework 06-05-23  Pt deadlift 25 # KB with pain in distraction of R shld Goal status:Partially met   3.  Pt will demonstrate hip flexion MMT of 4+/5 bilaterally in order to demonstrate improved strength for functional movements.  Baseline: see MMT chart above 06-05-23  See chart  All but R hip flexion Goal status: Partially met   4. Pt will perform 5xSTS in <9sec sec in order to demonstrate reduced fall risk and improved functional independence. (MCID of 2.3sec)            Baseline: 11 sec no UE support   06-05-23 8.40 sec            Goal status: MET   5. Pt will improve at least MCID on in order to indicate improved functional tolerance.            Baseline: NT on eval given time constraints  06-05-23  Pt tries to get in about 6000 to 7000 steps a day            Goal status: Partially met   6. Pt will report ability to work full 8hr work day in order to facilitate improved return to [employer] and maximize functional tolerance.  Baseline: working half days  06-05-23 pt now working 5-6 hours days at present            Goal status: Partially met goal   PLAN:   PT FREQUENCY: 2x/week   PT DURATION: 8 weeks   PLANNED INTERVENTIONS: Therapeutic exercises, Therapeutic activity, Neuromuscular re-education, Balance training, Gait training, Patient/Family education, Self Care, Joint mobilization, Stair training, Aquatic Therapy, Dry Needling, Spinal mobilization, Cryotherapy, Moist heat, Taping,  Manual therapy, and Re-evaluation.   PLAN FOR NEXT SESSION: consider continuing to defer manual pending pt response to this session. Continue working on [facility] mobility/stability, lumbopelvic activation. Functional endurance as able/tolerated. FOTO/goals next session   Garen Lah, PT, ATRIC Certified Exercise Expert for the Aging Adult  06/05/23 2:32 PM Phone: [phone] Fax: [phone]     Garen Lah, PT, ATRIC Certified Exercise Expert for the Aging Adult  01/17/24 1:13 PM Phone: [phone] Fax: [phone]

## 2023-06-05 ENCOUNTER — Ambulatory Visit: Payer: Commercial Managed Care - PPO | Attending: Surgery | Admitting: Physical Therapy

## 2023-06-05 ENCOUNTER — Encounter: Payer: Self-pay | Admitting: Physical Therapy

## 2023-06-05 DIAGNOSIS — M6281 Muscle weakness (generalized): Secondary | ICD-10-CM | POA: Insufficient documentation

## 2023-06-05 DIAGNOSIS — R2689 Other abnormalities of gait and mobility: Secondary | ICD-10-CM | POA: Diagnosis present

## 2023-06-07 ENCOUNTER — Ambulatory Visit: Payer: Commercial Managed Care - PPO | Admitting: Physical Therapy

## 2024-03-14 ENCOUNTER — Encounter (HOSPITAL_COMMUNITY): Payer: Self-pay | Admitting: *Deleted

## 2024-10-03 ENCOUNTER — Other Ambulatory Visit (HOSPITAL_COMMUNITY): Payer: Self-pay
